# Patient Record
Sex: Female | Born: 1945 | Race: White | Hispanic: No | Smoking: Current every day smoker
Health system: Southern US, Community
[De-identification: ages and names within clinical notes are randomized; demographics above are authoritative.]

## PROBLEM LIST (undated history)

## (undated) DIAGNOSIS — Z860100 Personal history of colon polyps, unspecified: Secondary | ICD-10-CM

## (undated) DIAGNOSIS — I10 Essential (primary) hypertension: Secondary | ICD-10-CM

## (undated) DIAGNOSIS — J449 Chronic obstructive pulmonary disease, unspecified: Secondary | ICD-10-CM

## (undated) DIAGNOSIS — Z860101 Personal history of adenomatous and serrated colon polyps: Secondary | ICD-10-CM

## (undated) DIAGNOSIS — H409 Unspecified glaucoma: Secondary | ICD-10-CM

## (undated) DIAGNOSIS — R7989 Other specified abnormal findings of blood chemistry: Secondary | ICD-10-CM

## (undated) DIAGNOSIS — E119 Type 2 diabetes mellitus without complications: Secondary | ICD-10-CM

## (undated) DIAGNOSIS — E041 Nontoxic single thyroid nodule: Secondary | ICD-10-CM

## (undated) DIAGNOSIS — M1990 Unspecified osteoarthritis, unspecified site: Secondary | ICD-10-CM

## (undated) HISTORY — DX: Personal history of adenomatous and serrated colon polyps: Z86.0101

## (undated) HISTORY — DX: Essential (primary) hypertension: I10

## (undated) HISTORY — PX: UPPER GI ENDOSCOPY: SHX6162

## (undated) HISTORY — DX: Type 2 diabetes mellitus without complications: E11.9

## (undated) HISTORY — DX: Other specified abnormal findings of blood chemistry: R79.89

## (undated) HISTORY — DX: Unspecified osteoarthritis, unspecified site: M19.90

## (undated) HISTORY — PX: FOOT SURGERY: SHX648

## (undated) HISTORY — DX: Unspecified glaucoma: H40.9

## (undated) HISTORY — DX: Personal history of colon polyps, unspecified: Z86.0100

## (undated) HISTORY — DX: Personal history of colonic polyps: Z86.010

---

## 1999-03-06 ENCOUNTER — Other Ambulatory Visit: Admission: RE | Admit: 1999-03-06 | Discharge: 1999-03-06 | Payer: Self-pay | Admitting: Podiatry

## 2002-06-30 HISTORY — PX: SHOULDER SURGERY: SHX246

## 2003-07-01 DIAGNOSIS — I10 Essential (primary) hypertension: Secondary | ICD-10-CM

## 2003-07-01 HISTORY — DX: Essential (primary) hypertension: I10

## 2003-07-24 ENCOUNTER — Encounter: Admission: RE | Admit: 2003-07-24 | Discharge: 2003-10-22 | Payer: Self-pay | Admitting: Anesthesiology

## 2005-06-30 HISTORY — PX: COLONOSCOPY: SHX174

## 2005-08-12 ENCOUNTER — Ambulatory Visit: Payer: Self-pay | Admitting: Family Medicine

## 2006-02-09 ENCOUNTER — Ambulatory Visit: Payer: Self-pay | Admitting: General Surgery

## 2006-06-18 ENCOUNTER — Ambulatory Visit: Payer: Self-pay

## 2006-06-25 ENCOUNTER — Ambulatory Visit: Payer: Self-pay

## 2006-12-15 ENCOUNTER — Encounter
Admission: RE | Admit: 2006-12-15 | Discharge: 2006-12-15 | Payer: Self-pay | Admitting: Physical Medicine and Rehabilitation

## 2007-01-25 ENCOUNTER — Other Ambulatory Visit: Admission: RE | Admit: 2007-01-25 | Discharge: 2007-01-25 | Payer: Self-pay | Admitting: Surgery

## 2007-07-01 HISTORY — PX: CHOLECYSTECTOMY: SHX55

## 2007-07-22 ENCOUNTER — Ambulatory Visit: Payer: Self-pay | Admitting: General Surgery

## 2007-07-26 ENCOUNTER — Ambulatory Visit: Payer: Self-pay | Admitting: General Surgery

## 2007-07-26 ENCOUNTER — Other Ambulatory Visit: Payer: Self-pay

## 2007-07-29 ENCOUNTER — Ambulatory Visit: Payer: Self-pay | Admitting: General Surgery

## 2007-07-31 ENCOUNTER — Inpatient Hospital Stay: Payer: Self-pay | Admitting: General Surgery

## 2007-07-31 ENCOUNTER — Other Ambulatory Visit: Payer: Self-pay

## 2008-06-30 DIAGNOSIS — H409 Unspecified glaucoma: Secondary | ICD-10-CM

## 2008-06-30 HISTORY — DX: Unspecified glaucoma: H40.9

## 2008-12-31 ENCOUNTER — Emergency Department (HOSPITAL_COMMUNITY): Admission: EM | Admit: 2008-12-31 | Discharge: 2008-12-31 | Payer: Self-pay | Admitting: Emergency Medicine

## 2009-01-25 ENCOUNTER — Ambulatory Visit: Payer: Self-pay | Admitting: Cardiovascular Disease

## 2009-06-30 HISTORY — PX: CAROTID STENT INSERTION: SHX5766

## 2009-12-12 ENCOUNTER — Emergency Department: Payer: Self-pay | Admitting: Emergency Medicine

## 2010-07-21 ENCOUNTER — Encounter: Payer: Self-pay | Admitting: Physical Medicine and Rehabilitation

## 2010-07-26 ENCOUNTER — Observation Stay: Payer: Self-pay | Admitting: Otolaryngology

## 2010-10-06 LAB — POCT I-STAT, CHEM 8
BUN: 7 mg/dL (ref 6–23)
Calcium, Ion: 1.01 mmol/L — ABNORMAL LOW (ref 1.12–1.32)
HCT: 37 % (ref 36.0–46.0)
TCO2: 23 mmol/L (ref 0–100)

## 2010-11-06 ENCOUNTER — Ambulatory Visit: Payer: Self-pay | Admitting: Gastroenterology

## 2010-11-11 LAB — PATHOLOGY REPORT

## 2010-11-15 NOTE — Group Therapy Note (Signed)
REASON FOR EVALUATION:  Stephanie Small is a pleasant 65 year old kindly  referred to [place] by primary care.  She is an individual who has suffered from  cervical and lumbar pain  for a number of years.  She actually had to retire  from work secondary to pain, decline in functional indices and quality-of-  life indices.  She relates her pain as an 8/10 on a subjective scale, dull,  aching and throbbing.  There is no temporal relation to the day.  Medications and rest help.  Her family doctor has her on OxyContin, which  she states helps her pain.  She understand this medication.  Her cervical  pain is primarily myofascial.  She has bilateral shoulder pain secondary to  previous rotator cuff repair and treatment of osteoarthritis.  She has  decreased range of motion in the cervical spine secondary to pain with  myofascial overlay.  Her paralumbar position does not reveal any radicular  component, she has no bowel or bladder disorder or overt neurological  deficit.  It is limited by primarily mechanical pain, so described.  She  states no wish to harm self or others.   MEDICATIONS:  Her medications include OxyContin, Tarka and Lexapro.   ALLERGIES:  No known drug allergies.   REVIEW OF SYSTEMS/PAST MEDICAL HISTORY:  The 14-point review of systems,  health and history form are reviewed.  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   PAST SURGICAL HISTORY:  Surgical history include right and left shoulder  arthroscopy and repair.   FAMILY HISTORY:  Family history remarkable for hypertension.   SOCIAL HISTORY:  She is a nonsmoker, nondrinker.  She is married.  She is  currently retired as a Writer.   Review of systems, family and social history otherwise noncontributory to  the pain problem.   PHYSICAL EXAMINATION:  GENERAL:  Physical exam  reveals a pleasant obese  female sitting comfortably in bed.  Gait, affect and appearance are normal,  oriented x3.  HEENT:  Unremarkable.  CHEST:  Chest clear to auscultation and percussion.  CARDIAC:  She has a regular rate and rhythm without rub, murmur or gallop.  ABDOMEN:  Abdomen soft, nontender and benign.  No hepatosplenomegaly.  It is  obese.  NEUROMUSCULAR:  She has diffuse paralumbar, paracervical, supraclavicular  and myofascial discomfort, quadratus lumborum pain, most notably with  extension and provocative antagonism, pain over PSIS.  Gaenslen's and  Patrick's equivocal.  Straight leg lift unimpaired.  EHL is intact.  She has  no other neurological deficits, motor, sensory or reflexic.   IMPRESSION:  1. Cervicalgia and degenerative spinal disease, cervical spine.  2. Degenerative spinal disease, lumbar spine.   PLAN:  1. We will go ahead and proceed with most problematic region, that being the     lumbar region, with lumbar epidural.  She also has a facetal overlay by     descriptive and by physical exam indices.  I reviewed the MRI, which     revealed degenerative components at multiple levels, with a facetal     overlay.  She has been seen by neurosurgery, who states that she has     multilevel disease, and they do not want to proceed with surgery     secondary to her multilevel disease.  I review available notes.     Apparently, she is being considered for a  discogram to identify the     primary pain generator as suspected at L5-S1.  2. Cervical components, primarily myofascial.  We will more than likely     treat conservatively.  Consider Lidoderm.  Consider muscle stimulator.  3. Recommend home-based therapy and physical therapy.  She lives in [place],     [place], which is quite a distance from any facility.  We     discussed home options.   We described the procedure in detail with her.  We will follow up with this  procedure and lumbar epidural as  described in layman's terms.  Medication  provided through primary care.  Extensive consultation.  Discharge  instructions given.     Celene Kras, MD   HH/MedQ  D:  07/25/2003 11:10:00  T:  07/25/2003 12:10:20  Job #:  [id number]   cc:   Sela Hua.D.

## 2010-11-15 NOTE — Assessment & Plan Note (Signed)
HISTORY:  The [facility].  I  evaluated her via the health and history form, with a 14-point review of  systems.  We performed an uneventful transforaminal injection at th last visit, but  she had side effects she relates to the injection, such as incontinence,  emotional lability and she also was awake at night one or two nights.  I am  thinking that this could possibly be steroid-related, but even those  symptoms are a reach for the dose of steroid we have given.  I do not think  that there was any procedural complication, but she wants to go ahead and  hold off, and that is fine.  We will go ahead and continue to manage her in a multi-modality standpoint,  and I recommend she see Dr. Erick Colace for acupuncture, and other  minimally invasive approaches to improve her function and quality of life.  Again, I discussed other life style enhancements, which would be positive  outcome of predictive illness such as weight control.  Dr. Wynn Banker will  help [place] with a rehab plan.   PHYSICAL EXAMINATION:  Objectively diffuse paralumbar myofascial discomfort  and pain over the PSIS notable, pain on extension with no known neurological  findings motor, sensory, or reflexes.   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   PLAN:  Conservative management.  Consider a translaminar approach at a later  date.  Will see her in followup, and exhaust conservative management.  The  goal is to minimize escalation of narcotic-based pain medication, and to  move away from OxyContin if possible, to a p.r.n. medication.  No barriers  to communication.  Discharge instructions given.      Celene Kras, MD   HH/MedQ  D:  09/19/2003 11:10:11  T:  09/19/2003 12:09:41  Job #:  [id number]

## 2010-11-15 NOTE — Procedures (Signed)
NAMEMAJESTI, GAMBRELL                         ACCOUNT NO.:  [id number]   MEDICAL RECORD NO.:  [id number]                   PATIENT TYPE:  REC   LOCATION:  [facility]                                  FACILITY:  [facility]   PHYSICIAN:  Celene Kras, MD                     DATE OF BIRTH:  05-Apr-1946   DATE OF PROCEDURE:  08/15/2003  DATE OF DISCHARGE:                                 OPERATIVE REPORT   1. [facility]     scheduled lumbar epidural.  The risks, benefits, complications, and     options are fully outlined.  I have reviewed this procedure in detail     with her.   1. Do not believe further imaging or diagnostics are warranted.   1. Lifestyle enhancements reviewed, such as weight control for best outcome.   Objectively diffuse paralumbar myofascial discomfort.  Pain over PSIS.  Notable pain with distention with __________ and Patrick's equivocal.  She  has no other overt neurological deficit of motor, sensory, or reflexes.   IMPRESSION:  Degenerative spinal disease of lumbar spine.   PLAN:  Lumbar epidural.  She is consented.   The patient was taken to the fluoroscopy suite and placed in the prone  position.  The back was prepped and draped in the usual fashion.  Using a  Hustead needle, I advanced to the L5-S1 interspace without any evidence of  CSF, heme, or paresthesia.  Tested block uneventfully.  Followed with 40 mg  of Aristocort and flushed needle.   She tolerated the procedure well.  No complications to above procedure.  Discharge instructions given.  Will see her in followup.                                                Celene Kras, MD    HH/MEDQ  D:  08/15/2003 11:48:01  T:  08/15/2003 12:41:34  Job:  [id number]

## 2011-07-25 ENCOUNTER — Ambulatory Visit: Payer: Self-pay | Admitting: General Surgery

## 2011-07-28 LAB — PATHOLOGY REPORT

## 2012-01-26 ENCOUNTER — Ambulatory Visit: Payer: Self-pay | Admitting: Otolaryngology

## 2012-02-23 ENCOUNTER — Other Ambulatory Visit: Payer: Self-pay | Admitting: Otolaryngology

## 2012-02-24 ENCOUNTER — Ambulatory Visit: Payer: Self-pay | Admitting: Otolaryngology

## 2012-05-10 ENCOUNTER — Emergency Department: Payer: Self-pay | Admitting: Emergency Medicine

## 2012-05-30 ENCOUNTER — Emergency Department: Payer: Self-pay | Admitting: Emergency Medicine

## 2012-09-07 ENCOUNTER — Ambulatory Visit: Payer: Self-pay | Admitting: Otolaryngology

## 2012-09-13 ENCOUNTER — Ambulatory Visit: Payer: Self-pay | Admitting: Otolaryngology

## 2012-10-07 DIAGNOSIS — G894 Chronic pain syndrome: Secondary | ICD-10-CM | POA: Diagnosis present

## 2012-10-07 DIAGNOSIS — M5030 Other cervical disc degeneration, unspecified cervical region: Secondary | ICD-10-CM | POA: Insufficient documentation

## 2012-10-07 DIAGNOSIS — M51369 Other intervertebral disc degeneration, lumbar region without mention of lumbar back pain or lower extremity pain: Secondary | ICD-10-CM | POA: Insufficient documentation

## 2012-10-07 DIAGNOSIS — M5136 Other intervertebral disc degeneration, lumbar region: Secondary | ICD-10-CM | POA: Insufficient documentation

## 2012-10-07 DIAGNOSIS — G8929 Other chronic pain: Secondary | ICD-10-CM | POA: Insufficient documentation

## 2012-10-07 DIAGNOSIS — IMO0002 Reserved for concepts with insufficient information to code with codable children: Secondary | ICD-10-CM | POA: Insufficient documentation

## 2012-10-07 DIAGNOSIS — M542 Cervicalgia: Secondary | ICD-10-CM | POA: Insufficient documentation

## 2013-01-13 ENCOUNTER — Encounter: Payer: Self-pay | Admitting: *Deleted

## 2013-09-27 ENCOUNTER — Emergency Department: Payer: Self-pay | Admitting: Emergency Medicine

## 2013-10-19 ENCOUNTER — Ambulatory Visit: Payer: Self-pay | Admitting: Ophthalmology

## 2013-11-23 ENCOUNTER — Ambulatory Visit: Payer: Self-pay | Admitting: Ophthalmology

## 2014-05-01 ENCOUNTER — Encounter: Payer: Self-pay | Admitting: *Deleted

## 2014-05-01 DIAGNOSIS — Z79899 Other long term (current) drug therapy: Secondary | ICD-10-CM | POA: Insufficient documentation

## 2014-07-31 DIAGNOSIS — M5136 Other intervertebral disc degeneration, lumbar region: Secondary | ICD-10-CM | POA: Insufficient documentation

## 2014-07-31 DIAGNOSIS — M5030 Other cervical disc degeneration, unspecified cervical region: Secondary | ICD-10-CM | POA: Insufficient documentation

## 2014-10-21 ENCOUNTER — Ambulatory Visit
Admit: 2014-10-21 | Disposition: A | Discharge status: Home / Self Care | Payer: Self-pay | Attending: Family Medicine | Admitting: Family Medicine

## 2014-10-21 ENCOUNTER — Ambulatory Visit
Admit: 2014-10-21 | Disposition: A | Discharge status: Home / Self Care | Payer: Self-pay | Attending: Physician Assistant | Admitting: Physician Assistant

## 2014-12-14 DIAGNOSIS — M6080 Other myositis, unspecified site: Secondary | ICD-10-CM | POA: Insufficient documentation

## 2014-12-14 DIAGNOSIS — M6089 Other myositis, multiple sites: Secondary | ICD-10-CM | POA: Insufficient documentation

## 2014-12-14 DIAGNOSIS — M7918 Myalgia, other site: Secondary | ICD-10-CM | POA: Insufficient documentation

## 2015-01-18 DIAGNOSIS — K2100 Gastro-esophageal reflux disease with esophagitis, without bleeding: Secondary | ICD-10-CM | POA: Insufficient documentation

## 2015-02-13 ENCOUNTER — Other Ambulatory Visit: Payer: Self-pay | Admitting: Otolaryngology

## 2015-02-13 DIAGNOSIS — E042 Nontoxic multinodular goiter: Secondary | ICD-10-CM

## 2015-02-16 ENCOUNTER — Ambulatory Visit
Admission: RE | Admit: 2015-02-16 | Discharge: 2015-02-16 | Disposition: A | Discharge status: Home / Self Care | Payer: Medicare Other | Source: Ambulatory Visit | Attending: Otolaryngology | Admitting: Otolaryngology

## 2015-02-16 DIAGNOSIS — R591 Generalized enlarged lymph nodes: Secondary | ICD-10-CM | POA: Insufficient documentation

## 2015-02-16 DIAGNOSIS — E042 Nontoxic multinodular goiter: Secondary | ICD-10-CM

## 2015-02-16 DIAGNOSIS — E041 Nontoxic single thyroid nodule: Secondary | ICD-10-CM | POA: Insufficient documentation

## 2015-02-20 ENCOUNTER — Other Ambulatory Visit: Payer: Self-pay | Admitting: Otolaryngology

## 2015-02-20 DIAGNOSIS — R1310 Dysphagia, unspecified: Secondary | ICD-10-CM

## 2015-03-07 ENCOUNTER — Ambulatory Visit: Payer: Medicare Other | Attending: Otolaryngology

## 2015-09-25 ENCOUNTER — Other Ambulatory Visit: Payer: Self-pay | Admitting: Otolaryngology

## 2015-09-25 DIAGNOSIS — E042 Nontoxic multinodular goiter: Secondary | ICD-10-CM

## 2015-10-04 ENCOUNTER — Ambulatory Visit
Admission: RE | Admit: 2015-10-04 | Discharge: 2015-10-04 | Disposition: A | Discharge status: Home / Self Care | Payer: Medicare Other | Source: Ambulatory Visit | Attending: Otolaryngology | Admitting: Otolaryngology

## 2015-10-04 DIAGNOSIS — E042 Nontoxic multinodular goiter: Secondary | ICD-10-CM

## 2015-10-15 ENCOUNTER — Other Ambulatory Visit: Payer: Self-pay | Admitting: Otolaryngology

## 2015-10-15 DIAGNOSIS — E041 Nontoxic single thyroid nodule: Secondary | ICD-10-CM

## 2015-10-22 ENCOUNTER — Ambulatory Visit: Payer: Medicare Other

## 2015-10-23 ENCOUNTER — Inpatient Hospital Stay
Admission: AD | Admit: 2015-10-23 | Discharge: 2015-10-24 | DRG: 190 | Disposition: A | Discharge status: Home / Self Care | Payer: Medicare Other | Source: Ambulatory Visit | Attending: Specialist | Admitting: Specialist

## 2015-10-23 ENCOUNTER — Encounter: Payer: Self-pay | Admitting: *Deleted

## 2015-10-23 ENCOUNTER — Inpatient Hospital Stay: Payer: Medicare Other

## 2015-10-23 DIAGNOSIS — J189 Pneumonia, unspecified organism: Secondary | ICD-10-CM | POA: Diagnosis present

## 2015-10-23 DIAGNOSIS — R251 Tremor, unspecified: Secondary | ICD-10-CM | POA: Diagnosis present

## 2015-10-23 DIAGNOSIS — F418 Other specified anxiety disorders: Secondary | ICD-10-CM | POA: Diagnosis present

## 2015-10-23 DIAGNOSIS — I10 Essential (primary) hypertension: Secondary | ICD-10-CM | POA: Diagnosis present

## 2015-10-23 DIAGNOSIS — Z79899 Other long term (current) drug therapy: Secondary | ICD-10-CM

## 2015-10-23 DIAGNOSIS — Z79891 Long term (current) use of opiate analgesic: Secondary | ICD-10-CM | POA: Diagnosis not present

## 2015-10-23 DIAGNOSIS — G8929 Other chronic pain: Secondary | ICD-10-CM | POA: Diagnosis present

## 2015-10-23 DIAGNOSIS — I2510 Atherosclerotic heart disease of native coronary artery without angina pectoris: Secondary | ICD-10-CM | POA: Diagnosis present

## 2015-10-23 DIAGNOSIS — Z7982 Long term (current) use of aspirin: Secondary | ICD-10-CM | POA: Diagnosis not present

## 2015-10-23 DIAGNOSIS — Z7902 Long term (current) use of antithrombotics/antiplatelets: Secondary | ICD-10-CM | POA: Diagnosis not present

## 2015-10-23 DIAGNOSIS — Z86010 Personal history of colonic polyps: Secondary | ICD-10-CM

## 2015-10-23 DIAGNOSIS — M1990 Unspecified osteoarthritis, unspecified site: Secondary | ICD-10-CM | POA: Diagnosis present

## 2015-10-23 DIAGNOSIS — G43909 Migraine, unspecified, not intractable, without status migrainosus: Secondary | ICD-10-CM | POA: Diagnosis present

## 2015-10-23 DIAGNOSIS — H409 Unspecified glaucoma: Secondary | ICD-10-CM | POA: Diagnosis present

## 2015-10-23 DIAGNOSIS — E119 Type 2 diabetes mellitus without complications: Secondary | ICD-10-CM | POA: Diagnosis present

## 2015-10-23 DIAGNOSIS — Z23 Encounter for immunization: Secondary | ICD-10-CM

## 2015-10-23 DIAGNOSIS — J441 Chronic obstructive pulmonary disease with (acute) exacerbation: Secondary | ICD-10-CM | POA: Diagnosis present

## 2015-10-23 DIAGNOSIS — E041 Nontoxic single thyroid nodule: Secondary | ICD-10-CM | POA: Diagnosis present

## 2015-10-23 DIAGNOSIS — F17210 Nicotine dependence, cigarettes, uncomplicated: Secondary | ICD-10-CM | POA: Diagnosis present

## 2015-10-23 DIAGNOSIS — J440 Chronic obstructive pulmonary disease with acute lower respiratory infection: Principal | ICD-10-CM | POA: Diagnosis present

## 2015-10-23 DIAGNOSIS — J449 Chronic obstructive pulmonary disease, unspecified: Secondary | ICD-10-CM | POA: Diagnosis present

## 2015-10-23 DIAGNOSIS — R059 Cough, unspecified: Secondary | ICD-10-CM

## 2015-10-23 DIAGNOSIS — M542 Cervicalgia: Secondary | ICD-10-CM | POA: Diagnosis present

## 2015-10-23 DIAGNOSIS — K219 Gastro-esophageal reflux disease without esophagitis: Secondary | ICD-10-CM | POA: Diagnosis present

## 2015-10-23 DIAGNOSIS — R0902 Hypoxemia: Secondary | ICD-10-CM | POA: Diagnosis present

## 2015-10-23 DIAGNOSIS — E785 Hyperlipidemia, unspecified: Secondary | ICD-10-CM | POA: Diagnosis present

## 2015-10-23 DIAGNOSIS — R05 Cough: Secondary | ICD-10-CM

## 2015-10-23 HISTORY — DX: Nontoxic single thyroid nodule: E04.1

## 2015-10-23 LAB — BASIC METABOLIC PANEL
ANION GAP: 8 (ref 5–15)
BUN: 11 mg/dL (ref 6–20)
CALCIUM: 9.1 mg/dL (ref 8.9–10.3)
CO2: 24 mmol/L (ref 22–32)
Chloride: 105 mmol/L (ref 101–111)
Creatinine, Ser: 0.76 mg/dL (ref 0.44–1.00)
GLUCOSE: 193 mg/dL — AB (ref 65–99)
POTASSIUM: 3.9 mmol/L (ref 3.5–5.1)
Sodium: 137 mmol/L (ref 135–145)

## 2015-10-23 LAB — CBC
HCT: 40.8 % (ref 35.0–47.0)
Hemoglobin: 13.8 g/dL (ref 12.0–16.0)
MCH: 31.2 pg (ref 26.0–34.0)
MCHC: 33.7 g/dL (ref 32.0–36.0)
MCV: 92.5 fL (ref 80.0–100.0)
PLATELETS: 216 10*3/uL (ref 150–440)
RBC: 4.41 MIL/uL (ref 3.80–5.20)
RDW: 13.2 % (ref 11.5–14.5)
WBC: 5.9 10*3/uL (ref 3.6–11.0)

## 2015-10-23 LAB — INFLUENZA PANEL BY PCR (TYPE A & B)
H1N1FLUPCR: NOT DETECTED
INFLAPCR: NEGATIVE
INFLBPCR: NEGATIVE

## 2015-10-23 LAB — TSH: TSH: 0.373 u[IU]/mL (ref 0.350–4.500)

## 2015-10-23 LAB — GLUCOSE, CAPILLARY
Glucose-Capillary: 147 mg/dL — ABNORMAL HIGH (ref 65–99)
Glucose-Capillary: 299 mg/dL — ABNORMAL HIGH (ref 65–99)

## 2015-10-23 MED ORDER — ACETAMINOPHEN 325 MG PO TABS: 650.0000 mg | ORAL_TABLET | Freq: Four times a day (QID) | ORAL | Status: DC | PRN

## 2015-10-23 MED ORDER — OXYCODONE HCL ER 60 MG PO T12A: EXTENDED_RELEASE_TABLET | Freq: Three times a day (TID) | ORAL | Status: DC

## 2015-10-23 MED ORDER — ENOXAPARIN SODIUM 40 MG/0.4ML ~~LOC~~ SOLN: 40.0000 mg | SUBCUTANEOUS | Status: DC

## 2015-10-23 MED ORDER — LINAGLIPTIN 5 MG PO TABS
5.0000 mg | ORAL_TABLET | Freq: Every day | ORAL | Status: DC
  Administered 2015-10-23 – 2015-10-24 (×2): 5 mg via ORAL
  Filled 2015-10-23 (×2): qty 1

## 2015-10-23 MED ORDER — CITALOPRAM HYDROBROMIDE 20 MG PO TABS
20.0000 mg | ORAL_TABLET | Freq: Every day | ORAL | Status: DC
  Administered 2015-10-23 – 2015-10-24 (×2): 20 mg via ORAL
  Filled 2015-10-23 (×2): qty 1

## 2015-10-23 MED ORDER — METHYLPREDNISOLONE SODIUM SUCC 125 MG IJ SOLR
60.0000 mg | Freq: Two times a day (BID) | INTRAMUSCULAR | Status: DC
  Administered 2015-10-23 – 2015-10-24 (×2): 60 mg via INTRAVENOUS
  Filled 2015-10-23 (×2): qty 2

## 2015-10-23 MED ORDER — INSULIN ASPART 100 UNIT/ML ~~LOC~~ SOLN
0.0000 [IU] | Freq: Three times a day (TID) | SUBCUTANEOUS | Status: DC
  Administered 2015-10-23: 1 [IU] via SUBCUTANEOUS
  Administered 2015-10-24: 2 [IU] via SUBCUTANEOUS
  Filled 2015-10-23: qty 2
  Filled 2015-10-23: qty 1

## 2015-10-23 MED ORDER — PNEUMOCOCCAL VAC POLYVALENT 25 MCG/0.5ML IJ INJ
0.5000 mL | INJECTION | INTRAMUSCULAR | Status: AC
  Administered 2015-10-24: 0.5 mL via INTRAMUSCULAR
  Filled 2015-10-23: qty 0.5

## 2015-10-23 MED ORDER — DICLOFENAC SODIUM 1 % TD GEL: 2.0000 g | Freq: Four times a day (QID) | TRANSDERMAL | Status: DC | PRN

## 2015-10-23 MED ORDER — BUDESONIDE 0.25 MG/2ML IN SUSP
0.2500 mg | Freq: Two times a day (BID) | RESPIRATORY_TRACT | Status: DC
  Administered 2015-10-23 – 2015-10-24 (×3): 0.25 mg via RESPIRATORY_TRACT
  Filled 2015-10-23 (×3): qty 2

## 2015-10-23 MED ORDER — AZITHROMYCIN 250 MG PO TABS
500.0000 mg | ORAL_TABLET | Freq: Every day | ORAL | Status: AC
  Administered 2015-10-23: 14:00:00 500 mg via ORAL
  Filled 2015-10-23: qty 2

## 2015-10-23 MED ORDER — METHOCARBAMOL 500 MG PO TABS
500.0000 mg | ORAL_TABLET | Freq: Three times a day (TID) | ORAL | Status: DC
Start: 2015-10-23 — End: 2015-10-24
  Administered 2015-10-23 – 2015-10-24 (×3): 500 mg via ORAL
  Filled 2015-10-23 (×3): qty 1

## 2015-10-23 MED ORDER — ACETAMINOPHEN 650 MG RE SUPP: 650.0000 mg | Freq: Four times a day (QID) | RECTAL | Status: DC | PRN

## 2015-10-23 MED ORDER — GABAPENTIN 400 MG PO CAPS
400.0000 mg | ORAL_CAPSULE | Freq: Every day | ORAL | Status: DC
  Administered 2015-10-23 – 2015-10-24 (×2): 400 mg via ORAL
  Filled 2015-10-23 (×2): qty 1

## 2015-10-23 MED ORDER — ISOSORBIDE MONONITRATE ER 30 MG PO TB24
30.0000 mg | ORAL_TABLET | Freq: Every day | ORAL | Status: DC
  Administered 2015-10-23 – 2015-10-24 (×2): 30 mg via ORAL
  Filled 2015-10-23 (×2): qty 1

## 2015-10-23 MED ORDER — INSULIN ASPART 100 UNIT/ML ~~LOC~~ SOLN
0.0000 [IU] | Freq: Every day | SUBCUTANEOUS | Status: DC
  Administered 2015-10-23: 22:00:00 3 [IU] via SUBCUTANEOUS
  Filled 2015-10-23: qty 3

## 2015-10-23 MED ORDER — ASPIRIN 81 MG PO CHEW
81.0000 mg | CHEWABLE_TABLET | Freq: Every day | ORAL | Status: DC
  Administered 2015-10-23 – 2015-10-24 (×2): 81 mg via ORAL
  Filled 2015-10-23 (×2): qty 1

## 2015-10-23 MED ORDER — DEXTROSE 5 % IV SOLN
1.0000 g | INTRAVENOUS | Status: DC
  Administered 2015-10-23: 1 g via INTRAVENOUS
  Filled 2015-10-23 (×2): qty 10

## 2015-10-23 MED ORDER — NICOTINE POLACRILEX 2 MG MT GUM: 2.0000 mg | CHEWING_GUM | OROMUCOSAL | Status: DC | PRN

## 2015-10-23 MED ORDER — PRAVASTATIN SODIUM 40 MG PO TABS
40.0000 mg | ORAL_TABLET | Freq: Every day | ORAL | Status: DC
  Administered 2015-10-23 – 2015-10-24 (×2): 40 mg via ORAL
  Filled 2015-10-23 (×2): qty 1

## 2015-10-23 MED ORDER — OXYCODONE HCL ER 20 MG PO T12A
60.0000 mg | EXTENDED_RELEASE_TABLET | Freq: Three times a day (TID) | ORAL | Status: DC
  Administered 2015-10-23 – 2015-10-24 (×2): 60 mg via ORAL
  Filled 2015-10-23 (×2): qty 3

## 2015-10-23 MED ORDER — PANTOPRAZOLE SODIUM 40 MG PO TBEC
40.0000 mg | DELAYED_RELEASE_TABLET | Freq: Every day | ORAL | Status: DC
  Administered 2015-10-23 – 2015-10-24 (×2): 40 mg via ORAL
  Filled 2015-10-23 (×2): qty 1

## 2015-10-23 MED ORDER — AZITHROMYCIN 250 MG PO TABS
250.0000 mg | ORAL_TABLET | Freq: Every day | ORAL | Status: DC
  Administered 2015-10-24: 250 mg via ORAL
  Filled 2015-10-23: qty 1

## 2015-10-23 MED ORDER — IPRATROPIUM-ALBUTEROL 0.5-2.5 (3) MG/3ML IN SOLN
3.0000 mL | Freq: Four times a day (QID) | RESPIRATORY_TRACT | Status: DC
  Administered 2015-10-23 – 2015-10-24 (×5): 3 mL via RESPIRATORY_TRACT
  Filled 2015-10-23 (×5): qty 3

## 2015-10-23 MED ORDER — PNEUMOCOCCAL 13-VAL CONJ VACC IM SUSP: 0.5000 mL | INTRAMUSCULAR | Status: DC

## 2015-10-23 NOTE — H&P (Signed)
[facility] [place] [facility]   PATIENT NAME: Stephanie Small    MR#:  [id number]  DATE OF BIRTH:  [birth year]  DATE OF ADMISSION:  10/23/2015  PRIMARY CARE PHYSICIAN: Lyndon CodeKHAN, FOZIA M, MD   REQUESTING/REFERRING PHYSICIAN: Dr. Desma Maximracy McLean  CHIEF COMPLAINT:  Sent in for low pulse ox  HISTORY OF PRESENT ILLNESS:  Stephanie Small  is a 70 y.o. female presents with shortness of breath that started yesterday. She was coughing and sneezing and developed a headache. She got up this morning and didn't feel any better and family called her physician. In her physician's office the pulse ox was 86% and she was sent in for further evaluation. Patient states that she does not have a productive cough. She is supposed to have a thyroid cyst drainage tomorrow by Dr. Andee PolesVaught. In the hospital, her pulse ox was 90% on room air.  PAST MEDICAL HISTORY:   Past Medical History  Diagnosis Date  . Glaucoma 2010  . Hypertension 2005  . Arthritis   . Diabetes mellitus without complication (HCC)   . Personal history of colonic polyps   . Thyroid cyst     PAST SURGICAL HISTORY:   Past Surgical History  Procedure Laterality Date  . Colonoscopy  2007  . Shoulder surgery Bilateral 2004  . Foot surgery    . Cholecystectomy  2009  . Upper gi endoscopy    . Carotid stent insertion  2011    SOCIAL HISTORY:   Social History  Substance Use Topics  . Smoking status: Current Every Day Smoker  . Smokeless tobacco: Never Used  . Alcohol Use: No    FAMILY HISTORY:   Family History  Problem Relation Age of Onset  . Hypertension Mother    both parents died of influenza.  DRUG ALLERGIES:  No Known Allergies  REVIEW OF SYSTEMS:  CONSTITUTIONAL: No fever. Some weakness. Cold all day yesterday. EYES: No blurred or double vision. Positive for runny nose EARS, NOSE, AND THROAT: No tinnitus or ear pain. No sore throat RESPIRATORY: Positive for cough and shortness of  breath, positive for wheezing. No hemoptysis.  CARDIOVASCULAR: No chest pain, orthopnea, edema.  GASTROINTESTINAL: Positive for dry heaves.  No vomiting, diarrhea or abdominal pain. No blood in bowel movements GENITOURINARY: No dysuria, hematuria.  ENDOCRINE: No polyuria, nocturia, history of goiter HEMATOLOGY: No anemia, easy bruising or bleeding SKIN: No rash or lesion. MUSCULOSKELETAL: Positive joint pains  NEUROLOGIC: No tingling, numbness, weakness.  PSYCHIATRY: No anxiety or depression.   MEDICATIONS AT HOME:   Prior to Admission medications   Medication Sig Start Date End Date Taking? Authorizing Provider  aspirin 81 MG tablet Take 81 mg by mouth daily.   Yes Historical Provider, MD  citalopram (CELEXA) 20 MG tablet Take 20 mg by mouth daily.   Yes Historical Provider, MD  clopidogrel (PLAVIX) 75 MG tablet Take 75 mg by mouth daily.   Yes Historical Provider, MD  diclofenac sodium (VOLTAREN) 1 % GEL Apply topically as needed.   Yes Historical Provider, MD  gabapentin (NEURONTIN) 400 MG capsule Take 400 mg by mouth daily.   Yes Historical Provider, MD  isosorbide mononitrate (IMDUR) 30 MG 24 hr tablet Take 30 mg by mouth daily.   Yes Historical Provider, MD  methocarbamol (ROBAXIN) 500 MG tablet Take 500 mg by mouth 3 (three) times daily.   Yes Historical Provider, MD  nicotine polacrilex (NICORETTE) 2 MG gum Take 2 mg by mouth as needed for smoking cessation.  Yes Historical Provider, MD  oxyCODONE (OXYCONTIN) 60 MG 12 hr tablet Take by mouth 3 (three) times daily.   Yes Historical Provider, MD  pantoprazole (PROTONIX) 40 MG tablet Take 40 mg by mouth daily.   Yes Historical Provider, MD  pravastatin (PRAVACHOL) 40 MG tablet Take 40 mg by mouth daily.   Yes Historical Provider, MD  saxagliptin HCl (ONGLYZA) 2.5 MG TABS tablet Take 2.5 mg by mouth daily.   Yes Historical Provider, MD  SUMAtriptan (IMITREX) 100 MG tablet Take 100 mg by mouth every 2 (two) hours as needed for  migraine. May repeat in 2 hours if headache persists or recurs.   Yes Historical Provider, MD      VITAL SIGNS:  Blood pressure 146/65, pulse 59, temperature 98 F (36.7 C), temperature source Oral, resp. rate 20, SpO2 90 %.  PHYSICAL EXAMINATION:  GENERAL:  70 y.o.-year-old patient lying in the bed with no acute distress.  EYES: Pupils equal, round, reactive to light and accommodation. No scleral icterus. Extraocular muscles intact.  HEENT: Head atraumatic, normocephalic. Oropharynx and nasopharynx clear.  NECK:  Supple, no jugular venous distention. Positive for right thyroid enlargement, no tenderness.  LUNGS: Decreased breath sounds bilaterally, positive for wheezing heard more anteriorly than posteriorly. No rales,rhonchi or crepitation. No use of accessory muscles of respiration.  CARDIOVASCULAR: S1, S2 normal. No murmurs, rubs, or gallops.  ABDOMEN: Soft, nontender, nondistended. Bowel sounds present. No organomegaly or mass.  EXTREMITIES: No pedal edema, cyanosis, or clubbing.  NEUROLOGIC: Cranial nerves II through XII are intact. Muscle strength 5/5 in all extremities. Sensation intact. Gait not checked.  PSYCHIATRIC: The patient is alert and oriented x 3.  SKIN: No rash, lesion, or ulcer.   LABORATORY PANEL:   CBC  Recent Labs Lab 10/23/15 1228  WBC 5.9  HGB 13.8  HCT 40.8  PLT 216   ------------------------------------------------------------------------------------------------------------------  Chemistries  No results for input(s): NA, K, CL, CO2, GLUCOSE, BUN, CREATININE, CALCIUM, MG, AST, ALT, ALKPHOS, BILITOT in the last 168 hours.  Invalid input(s): GFRCGP ------------------------------------------------------------------------------------------------------------------  Cardiac Enzymes No results for input(s): TROPONINI in the last 168  hours. ------------------------------------------------------------------------------------------------------------------  RADIOLOGY:  No results found.  IMPRESSION AND PLAN:   1. COPD exacerbation. Start IV Solu-Medrol, nebulizer treatments with budesonide nebulizers and DuoNeb nebulizers. Start Zithromax. Obtain a chest x-ray. Since pulse ox was 90% on room air here no need for oxygen supplementation. 2. Tobacco abuse smoking cessation counseling done 3 minutes by me and nicotine patch not needed. 3. Right thyroid goiter cyst. Patient was due for drainage tomorrow. We'll see how her respiratory status is prior to allowing this. 4. Type 2 diabetes sugars will likely be high with steroids. Put on sliding scale continue oral medication 5. Chronic neck pain. Continue oxycodone. 6. History of CAD on aspirin. Hold Plavix for procedure 7. Hyperlipidemia continue medication 8. Chronic tremor 9. Gastroesophageal reflux disease without esophagitis on PPI 10. Anxiety depression continue usual medications  All the records are reviewed and case discussed with attending and family. Management plans discussed with the patient, family and they are in agreement.  CODE STATUS: Full code  TOTAL TIME TAKING CARE OF THIS PATIENT: 50 minutes.    Alford Highland M.D on 10/23/2015 at 1:12 PM  Between 7am to 6pm - Pager - [phone]  After 6pm call admission pager [phone]  [facility]  [phone]  CC: Primary care physician; Lyndon Code, MD

## 2015-10-24 ENCOUNTER — Ambulatory Visit: Admission: RE | Admit: 2015-10-24 | Payer: Medicare Other | Source: Ambulatory Visit

## 2015-10-24 DIAGNOSIS — J440 Chronic obstructive pulmonary disease with acute lower respiratory infection: Secondary | ICD-10-CM | POA: Diagnosis not present

## 2015-10-24 LAB — CBC
HEMATOCRIT: 38.5 % (ref 35.0–47.0)
HEMOGLOBIN: 13.4 g/dL (ref 12.0–16.0)
MCH: 31.9 pg (ref 26.0–34.0)
MCHC: 34.7 g/dL (ref 32.0–36.0)
MCV: 92 fL (ref 80.0–100.0)
Platelets: 217 10*3/uL (ref 150–440)
RBC: 4.19 MIL/uL (ref 3.80–5.20)
RDW: 13.4 % (ref 11.5–14.5)
WBC: 5.7 10*3/uL (ref 3.6–11.0)

## 2015-10-24 LAB — BASIC METABOLIC PANEL
ANION GAP: 8 (ref 5–15)
BUN: 17 mg/dL (ref 6–20)
CHLORIDE: 104 mmol/L (ref 101–111)
CO2: 24 mmol/L (ref 22–32)
CREATININE: 0.78 mg/dL (ref 0.44–1.00)
Calcium: 9.2 mg/dL (ref 8.9–10.3)
GFR calc non Af Amer: >60 mL/min (ref >60)
Glucose, Bld: 208 mg/dL — ABNORMAL HIGH (ref 65–99)
POTASSIUM: 4.3 mmol/L (ref 3.5–5.1)
Sodium: 136 mmol/L (ref 135–145)

## 2015-10-24 LAB — GLUCOSE, CAPILLARY
Glucose-Capillary: 198 mg/dL — ABNORMAL HIGH (ref 65–99)
Glucose-Capillary: 269 mg/dL — ABNORMAL HIGH (ref 65–99)

## 2015-10-24 MED ORDER — INSULIN ASPART 100 UNIT/ML ~~LOC~~ SOLN
0.0000 [IU] | Freq: Three times a day (TID) | SUBCUTANEOUS | Status: DC
  Administered 2015-10-24: 13:00:00 8 [IU] via SUBCUTANEOUS
  Filled 2015-10-24: qty 8

## 2015-10-24 MED ORDER — PREDNISONE 10 MG PO TABS: ORAL_TABLET | ORAL | Status: DC

## 2015-10-24 MED ORDER — LEVOFLOXACIN 750 MG PO TABS: 750.0000 mg | ORAL_TABLET | Freq: Every day | ORAL | Status: DC

## 2015-10-24 MED ORDER — INSULIN ASPART 100 UNIT/ML ~~LOC~~ SOLN: 0.0000 [IU] | Freq: Every day | SUBCUTANEOUS | Status: DC

## 2015-10-24 MED ORDER — METHYLPREDNISOLONE SODIUM SUCC 125 MG IJ SOLR: 60.0000 mg | Freq: Every day | INTRAMUSCULAR | Status: DC

## 2015-10-24 MED ORDER — IBUPROFEN 600 MG PO TABS
600.0000 mg | ORAL_TABLET | Freq: Four times a day (QID) | ORAL | Status: DC | PRN
  Administered 2015-10-24: 13:00:00 600 mg via ORAL
  Filled 2015-10-24: qty 1

## 2015-10-24 MED ORDER — INSULIN ASPART 100 UNIT/ML ~~LOC~~ SOLN
3.0000 [IU] | Freq: Three times a day (TID) | SUBCUTANEOUS | Status: DC
  Administered 2015-10-24: 13:00:00 3 [IU] via SUBCUTANEOUS

## 2015-10-24 NOTE — Progress Notes (Signed)
SATURATION QUALIFICATIONS: (This note is used to comply with regulatory documentation for home oxygen)  Patient Saturations on Room Air at Rest = 92%  Patient Saturations on Room Air while Ambulating = 90%   patient does not need home oxygen at this time because saturations on room air are above 88%.

## 2015-10-24 NOTE — Discharge Summary (Signed)
[employer] - [place] at [facility]   PATIENT NAME: Stephanie Small    MR#:  [id number]  DATE OF BIRTH:  Jan 08, 1946  DATE OF ADMISSION:  10/23/2015 ADMITTING PHYSICIAN: Stephanie Highland, MD  DATE OF DISCHARGE: 10/24/2015  PRIMARY CARE PHYSICIAN: Stephanie Code, MD    ADMISSION DIAGNOSIS:  Hypoxia  DISCHARGE DIAGNOSIS:  Active Problems:   COPD exacerbation (HCC)   SECONDARY DIAGNOSIS:   Past Medical History  Diagnosis Date  . Glaucoma 2010  . Hypertension 2005  . Arthritis   . Diabetes mellitus without complication (HCC)   . Personal history of colonic polyps   . Thyroid cyst     HOSPITAL COURSE:   70 year old female with past medical history of glaucoma, hypertension, diabetes, thyroid goiter who presented to the hospital due to shortness of breath and noted to be hypoxic.  1. COPD exacerbation-patient was admitted to the hospital started on IV steroids, DuoNeb's, Pulmicort nebs. -She was also given empiric antibiotics with ceftriaxone and Zithromax for pneumonia. She has clinically improved now being discharged on oral prednisone taper, Levaquin for a few days. -She was ambulated and did not qualify for home oxygen.  2. Pneumonia-while in the hospital patient was treated with IV ceftriaxone, Zithromax but now being discharged on oral Levaquin.  3. History of migraines-she will continue her sumatriptan.  4. Diabetes type 2 without complication-patient's blood sugars were somewhat labile due to the IV steroids but should improve as his steroids are being tapered. She will continue her Onglyza.  5. Essential hypertension-patient will resume her Imdur.  6. Chronic pain-she'll resume her OxyContin.  7. Thyroid cysts/Goitre-patient is awaiting ultrasound-guided biopsy and can reschedule this as an outpatient once discharged.  DISCHARGE CONDITIONS:   Stable  CONSULTS OBTAINED:     DRUG ALLERGIES:  No Known Allergies  DISCHARGE MEDICATIONS:    Current Discharge Medication List    START taking these medications   Details  levofloxacin (LEVAQUIN) 750 MG tablet Take 1 tablet (750 mg total) by mouth daily. Qty: 7 tablet, Refills: 0    predniSONE (DELTASONE) 10 MG tablet Label  & dispense according to the schedule below. 5 Pills PO for 1 day then, 4 Pills PO for 1 day, 3 Pills PO for 1 day, 2 Pills PO for 1 day, 1 Pill PO for 1 days then STOP. Qty: 15 tablet, Refills: 0      CONTINUE these medications which have NOT CHANGED   Details  aspirin 81 MG tablet Take 81 mg by mouth daily.    citalopram (CELEXA) 20 MG tablet Take 20 mg by mouth daily.    clopidogrel (PLAVIX) 75 MG tablet Take 75 mg by mouth daily.    diclofenac sodium (VOLTAREN) 1 % GEL Apply topically as needed.    gabapentin (NEURONTIN) 400 MG capsule Take 400 mg by mouth daily.    isosorbide mononitrate (IMDUR) 30 MG 24 hr tablet Take 30 mg by mouth daily.    methocarbamol (ROBAXIN) 500 MG tablet Take 500 mg by mouth 3 (three) times daily.    nicotine polacrilex (NICORETTE) 2 MG gum Take 2 mg by mouth as needed for smoking cessation.    oxyCODONE (OXYCONTIN) 60 MG 12 hr tablet Take by mouth 3 (three) times daily.    pantoprazole (PROTONIX) 40 MG tablet Take 40 mg by mouth daily.    pravastatin (PRAVACHOL) 40 MG tablet Take 40 mg by mouth daily.    saxagliptin HCl (ONGLYZA) 2.5 MG TABS tablet Take 2.5 mg by  mouth daily.    SUMAtriptan (IMITREX) 100 MG tablet Take 100 mg by mouth every 2 (two) hours as needed for migraine. May repeat in 2 hours if headache persists or recurs.         DISCHARGE INSTRUCTIONS:   DIET:  Cardiac diet and Diabetic diet  DISCHARGE CONDITION:  Stable  ACTIVITY:  Activity as tolerated  OXYGEN:  Home Oxygen: No.   Oxygen Delivery: room air  DISCHARGE LOCATION:  home   If you experience worsening of your admission symptoms, develop shortness of breath, life threatening emergency, suicidal or homicidal thoughts  you must seek medical attention immediately by calling 911 or calling your MD immediately  if symptoms less severe.  You Must read complete instructions/literature along with all the possible adverse reactions/side effects for all the Medicines you take and that have been prescribed to you. Take any new Medicines after you have completely understood and accpet all the possible adverse reactions/side effects.   Please note  You were cared for by a hospitalist during your hospital stay. If you have any questions about your discharge medications or the care you received while you were in the hospital after you are discharged, you can call the unit and asked to speak with the hospitalist on call if the hospitalist that took care of you is not available. Once you are discharged, your primary care physician will handle any further medical issues. Please note that NO REFILLS for any discharge medications will be authorized once you are discharged, as it is imperative that you return to your primary care physician (or establish a relationship with a primary care physician if you do not have one) for your aftercare needs so that they can reassess your need for medications and monitor your lab values.     Today   Shortness of breath, wheezing much improved. Family at bedside. Complaining of a headache due to a migraine.  VITAL SIGNS:  Blood pressure 122/63, pulse 71, temperature 98.1 F (36.7 C), temperature source Oral, resp. rate 20, height  (1.626 m), weight 99.139 kg (218 lb 9 oz), SpO2 91 %.  I/O:   Intake/Output Summary (Last 24 hours) at 10/24/15 1359 Last data filed at 10/24/15 0900  Gross per 24 hour  Intake    480 ml  Output      0 ml  Net    480 ml    PHYSICAL EXAMINATION:  GENERAL:  70 y.o.-year-old patient lying in the bed with no acute distress.  EYES: Pupils equal, round, reactive to light and accommodation. No scleral icterus. Extraocular muscles intact.  HEENT: Head  atraumatic, normocephalic. Oropharynx and nasopharynx clear.  NECK:  Supple, no jugular venous distention. No thyroid enlargement, no tenderness.  LUNGS: Normal breath sounds bilaterally, minimal end expiratory wheezing, no rales,rhonchi. No use of accessory muscles of respiration.  CARDIOVASCULAR: S1, S2 normal. No murmurs, rubs, or gallops.  ABDOMEN: Soft, non-tender, non-distended. Bowel sounds present. No organomegaly or mass.  EXTREMITIES: No pedal edema, cyanosis, or clubbing.  NEUROLOGIC: Cranial nerves II through XII are intact. No focal motor or sensory defecits b/l.  PSYCHIATRIC: The patient is alert and oriented x 3. Good affect.  SKIN: No obvious rash, lesion, or ulcer.   DATA REVIEW:   CBC  Recent Labs Lab 10/24/15 0355  WBC 5.7  HGB 13.4  HCT 38.5  PLT 217    Chemistries   Recent Labs Lab 10/24/15 0355  NA 136  K 4.3  CL 104  CO2  24  GLUCOSE 208*  BUN 17  CREATININE 0.78  CALCIUM 9.2    Cardiac Enzymes No results for input(s): TROPONINI in the last 168 hours.  Microbiology Results  Results for orders placed or performed during the hospital encounter of 10/23/15  CULTURE, BLOOD (ROUTINE X 2) w Reflex to PCR ID Panel     Status: None (Preliminary result)   Collection Time: 10/23/15 12:20 PM  Result Value Ref Range Status   Specimen Description BLOOD LEFT ARM  Final   Special Requests BOTTLES DRAWN AEROBIC AND ANAEROBIC 7CC  Final   Culture NO GROWTH <12 HOURS  Final   Report Status PENDING  Incomplete  CULTURE, BLOOD (ROUTINE X 2) w Reflex to PCR ID Panel     Status: None (Preliminary result)   Collection Time: 10/23/15 12:28 PM  Result Value Ref Range Status   Specimen Description BLOOD RIGHT ARM  Final   Special Requests BOTTLES DRAWN AEROBIC AND ANAEROBIC  8CC  Final   Culture NO GROWTH <12 HOURS  Final   Report Status PENDING  Incomplete    RADIOLOGY:  Dg Chest 2 View  10/23/2015  CLINICAL DATA:  Evaluation of cough post admittance. Patient  has had flu like symptoms. EXAM: CHEST - 2 VIEW COMPARISON:  12/31/2008 FINDINGS: Coarse perihilar interstitial markings. Patchy infiltrate in the anterior left upper lobe. Some linear scarring or subsegmental atelectasis at the left lung base and in the right upper lobe. Heart size remains normal. Atheromatous aorta. No effusion.  No pneumothorax. Anterior vertebral endplate spurring at multiple levels in the lower thoracic spine. Surgical clips right upper abdomen. IMPRESSION: 1. Coarse interstitial markings with patchy anterior left upper lobe infiltrate possibly early pneumonia. Electronically Signed   By: Corlis Leak  Hassell M.D.   On: 10/23/2015 14:09      Management plans discussed with the patient, family and they are in agreement.  Small STATUS:     Small Status Orders        Start     Ordered   10/23/15 1250  Full Small   Continuous     10/23/15 1250    Small Status History    Date Active Date Inactive Small Status Order ID Comments User Context   This patient has a current Small status but no historical Small status.    Advance Directive Documentation        Most Recent Value   Type of Advance Directive  Healthcare Power of Attorney   Pre-existing out of facility DNR order (yellow form or pink MOST form)     "MOST" Form in Place?        TOTAL TIME TAKING CARE OF THIS PATIENT: 40 minutes.    Houston SirenSAINANI,Tarrin Lebow J M.D on 10/24/2015 at 1:59 PM  Between 7am to 6pm - Pager - [phone]  After 6pm go to [email or web address] - password [email or web address] [facility]  [place]  Office  [phone]  CC: Primary care physician; Stephanie CodeKHAN, FOZIA M, MD

## 2015-10-24 NOTE — Progress Notes (Signed)
Pt being discharged today. Discharge instructions given to pt, she verified understanding, prescriptions given to pt.. Pt belongings returned. She was rolled out in wheelchair by staff.

## 2015-10-24 NOTE — Progress Notes (Signed)
Inpatient Diabetes Program Recommendations  AACE/ADA: New Consensus Statement on Inpatient Glycemic Control (2015)  Target Ranges:  Prepandial:   less than 140 mg/dL      Peak postprandial:   less than 180 mg/dL (1-2 hours)      Critically ill patients:  140 - 180 mg/dL  Results for Nadara MustardHORNBUCKLE, Emanuel H (MRN [id number]) as of 10/24/2015 08:25  Ref. Range 10/23/2015 16:56 10/23/2015 21:30 10/24/2015 07:31  Glucose-Capillary Latest Ref Range: 65-99 mg/dL 956147 (H) 213299 (H) 086198 (H)   Review of Glycemic Control  Diabetes history: DM2 Outpatient Diabetes medications: Onglyza 2.5 mg daily Current orders for Inpatient glycemic control: Novolog 0-9 units TID with meals, Novolog 0-5 units QHS, Tradjenta 5 mg daily  Inpatient Diabetes Program Recommendations: Correction (SSI): Please consider increasing Novolog correction to moderate scale (Novolog 0-15 units ) ACHS. Insulin - Meal Coverage: If steroids are continued as ordered (Solumedrol 60 mg Q12H), please consider ordering Novolog 3 units TID with meals for meal coverage.  Thanks, Orlando PennerMarie Casie Sturgeon, RN, MSN, CDE Diabetes Coordinator Inpatient Diabetes Program [phone] (Team Pager from 8am to 5pm) [phone] ([facility] office) [phone] [facility] office) [phone] [facility] office)

## 2015-10-25 DIAGNOSIS — E049 Nontoxic goiter, unspecified: Secondary | ICD-10-CM | POA: Insufficient documentation

## 2015-10-25 DIAGNOSIS — E041 Nontoxic single thyroid nodule: Secondary | ICD-10-CM | POA: Insufficient documentation

## 2015-10-25 DIAGNOSIS — R0603 Acute respiratory distress: Secondary | ICD-10-CM

## 2015-10-25 DIAGNOSIS — E1129 Type 2 diabetes mellitus with other diabetic kidney complication: Secondary | ICD-10-CM | POA: Insufficient documentation

## 2015-10-25 DIAGNOSIS — E119 Type 2 diabetes mellitus without complications: Secondary | ICD-10-CM | POA: Insufficient documentation

## 2015-10-25 DIAGNOSIS — R062 Wheezing: Secondary | ICD-10-CM | POA: Insufficient documentation

## 2015-10-25 DIAGNOSIS — N183 Chronic kidney disease, stage 3 unspecified: Secondary | ICD-10-CM | POA: Insufficient documentation

## 2015-10-25 DIAGNOSIS — I10 Essential (primary) hypertension: Secondary | ICD-10-CM | POA: Insufficient documentation

## 2015-10-25 HISTORY — DX: Acute respiratory distress: R06.03

## 2015-10-25 HISTORY — DX: Wheezing: R06.2

## 2015-10-28 LAB — CULTURE, BLOOD (ROUTINE X 2)
CULTURE: NO GROWTH
Culture: NO GROWTH

## 2015-12-07 ENCOUNTER — Other Ambulatory Visit: Payer: Self-pay | Admitting: Otolaryngology

## 2015-12-07 DIAGNOSIS — R1310 Dysphagia, unspecified: Secondary | ICD-10-CM

## 2015-12-11 ENCOUNTER — Ambulatory Visit
Admission: RE | Admit: 2015-12-11 | Discharge: 2015-12-11 | Disposition: A | Discharge status: Home / Self Care | Payer: Medicare Other | Source: Ambulatory Visit | Attending: Otolaryngology | Admitting: Otolaryngology

## 2015-12-11 DIAGNOSIS — E041 Nontoxic single thyroid nodule: Secondary | ICD-10-CM | POA: Insufficient documentation

## 2015-12-11 DIAGNOSIS — E079 Disorder of thyroid, unspecified: Secondary | ICD-10-CM | POA: Diagnosis not present

## 2015-12-11 LAB — CBC
HEMATOCRIT: 41.6 % (ref 35.0–47.0)
HEMOGLOBIN: 13.8 g/dL (ref 12.0–16.0)
MCH: 31 pg (ref 26.0–34.0)
MCHC: 33.2 g/dL (ref 32.0–36.0)
MCV: 93.4 fL (ref 80.0–100.0)
Platelets: 232 10*3/uL (ref 150–440)
RBC: 4.46 MIL/uL (ref 3.80–5.20)
RDW: 13.1 % (ref 11.5–14.5)
WBC: 5.7 10*3/uL (ref 3.6–11.0)

## 2015-12-11 LAB — APTT: aPTT: 30 seconds (ref 24–36)

## 2015-12-11 LAB — PROTIME-INR
INR: 1.12
Prothrombin Time: 14.6 seconds (ref 11.4–15.0)

## 2015-12-11 NOTE — Procedures (Signed)
Procedure and risks discussed with patient. Informed consent obtained. Will perform US-guided right thyroid biopsy.

## 2015-12-11 NOTE — Procedures (Signed)
Under US guidance, FNA of right thyroid nodule was performed. No immediate complications were noted.

## 2015-12-13 DIAGNOSIS — G250 Essential tremor: Secondary | ICD-10-CM | POA: Insufficient documentation

## 2015-12-19 ENCOUNTER — Ambulatory Visit
Admission: RE | Admit: 2015-12-19 | Discharge: 2015-12-19 | Disposition: A | Discharge status: Home / Self Care | Payer: Medicare Other | Source: Ambulatory Visit | Attending: Otolaryngology | Admitting: Otolaryngology

## 2015-12-19 DIAGNOSIS — R1310 Dysphagia, unspecified: Secondary | ICD-10-CM | POA: Diagnosis present

## 2015-12-19 DIAGNOSIS — R1312 Dysphagia, oropharyngeal phase: Secondary | ICD-10-CM

## 2015-12-19 NOTE — Therapy (Signed)
[facility] [address] [place], [place], [address] Phone: [phone]   Fax:     Modified Barium Swallow  Patient Details  Name: Stephanie Small MRN: [id number] Date of Birth: 07-18-1945 No Data Recorded  Encounter Date: 12/19/2015      End of Session - 12/19/15 1325    Visit Number 1   Number of Visits 1   Date for SLP Re-Evaluation 12/19/15   SLP Start Time 1230   SLP Stop Time  1320   SLP Time Calculation (min) 50 min   Activity Tolerance Patient tolerated treatment well      Past Medical History  Diagnosis Date  . Glaucoma 2010  . Hypertension 2005  . Arthritis   . Diabetes mellitus without complication (HCC)   . Personal history of colonic polyps   . Thyroid cyst     Past Surgical History  Procedure Laterality Date  . Colonoscopy  2007  . Shoulder surgery Bilateral 2004  . Foot surgery    . Cholecystectomy  2009  . Upper gi endoscopy    . Carotid stent insertion  2011    There were no vitals filed for this visit.   Subjective: Patient behavior: (alertness, ability to follow instructions, etc.): Patient is able to provide her swallowing history and follow directions.  Speech is impaired, but intelligible.  Chief complaint: difficulty swallowing secondary thyroid nodules.  Patient states that swallowing is much improved since procedure on the thryoid   Objective:  Radiological Procedure: A videoflouroscopic evaluation of oral-preparatory, reflex initiation, and pharyngeal phases of the swallow was performed; as well as a screening of the upper esophageal phase.  I. POSTURE: Upright in MBS chair  II. VIEW: Lateral  III. COMPENSATORY STRATEGIES: N/A  IV. BOLUSES ADMINISTERED:   Thin Liquid: 2 Small sips, 3 rapid consecutive sips   Nectar-thick Liquid: 1 moderate size bolus    Puree: 2 teaspoon presentations   Mechanical Soft: 1/4 graham cracker in apple sauce  V. RESULTS OF  EVALUATION: A. ORAL PREPARATORY PHASE: (The lips, tongue, and velum are observed for strength and coordination)       **Overall Severity Rating: Within normal limits; ill fitting dentures have Small impact on efficiency of mastication  B. SWALLOW INITIATION/REFLEX: (The reflex is normal if "triggered" by the time the bolus reached the base of the tongue)  **Overall Severity Rating: Mild; triggering at the valleculae for thick consistencies and while falling from the valleculae to the pyriform sinuses with thin liquid  C. PHARYNGEAL PHASE: (Pharyngeal function is normal if the bolus shows rapid, smooth, and continuous transit through the pharynx and there is no pharyngeal residue after the swallow)  **Overall Severity Rating: Within normal limits  D. LARYNGEAL PENETRATION: (Material entering into the laryngeal inlet/vestibule but not aspirated) 2 episodes of transient laryngeal penetration  E. ASPIRATION: None  F. ESOPHAGEAL PHASE: (Screening of the upper esophagus): no abnormality within the viewable cervical esophagus  ASSESSMENT: 70 year old woman, with thyroid nodule and associated difficulty swallowing, is presenting with minimal oropharyngeal dysphagia.  Oral control of the bolus including oral hold, rotary mastication, and anterior to posterior transfer are within normal limits. Timing of the pharyngeal swallow is delayed, triggering at the valleculae for thick consistencies and while falling from the valleculae to the pyriform sinuses with thin liquid.   Pharyngeal stage of swallowing including tongue base retraction, hyolaryngeal excursion, epiglottic inversion, duration/amplitude of UES opening, and laryngeal vestibule closure at the height of  the swallow are within normal limits.  There were 2 episodes of transient laryngeal penetration without laryngeal vestibule residue.  There was no observed pharyngeal residue or aspiration.  The patient reported resolution of subjective swallowing  problems after "they took off fluid from my goiter".  PLAN/RECOMMENDATIONS:   A. Diet: Regular   B. Swallowing Precautions: Standard, reflux precautions   C. Recommended consultation to: follow up with ENT as recommended   D. Therapy recommendations N/A   E. Results and recommendations were discussed with the patient immediately following the study and the final report routed to referring MD.     Dysphagia, oropharyngeal phase      G-Codes - December 29, 2015 1326    Functional Assessment Tool Used MBS, clinical judgment   Functional Limitations Swallowing   Swallow Current Status (Z6109) At least 1 percent but less than 20 percent impaired, limited or restricted   Swallow Goal Status (U0454) At least 1 percent but less than 20 percent impaired, limited or restricted   Swallow Discharge Status [id number]) At least 1 percent but less than 20 percent impaired, limited or restricted          Problem List Patient Active Problem List   Diagnosis Date Noted  . COPD exacerbation (HCC) 10/23/2015   Dollene Primrose, MS/CCC- SLP  Leandrew Koyanagi 12/29/2015, 1:27 PM  [facility] [address] [place], [place], [address] Phone: [phone]   Fax:     Name: Stephanie Small MRN: [id number] Date of Birth: [birth year]

## 2016-01-10 LAB — CYTOLOGY - NON PAP

## 2016-01-14 ENCOUNTER — Encounter: Payer: Self-pay | Admitting: Otolaryngology

## 2016-01-16 ENCOUNTER — Other Ambulatory Visit: Payer: Self-pay | Admitting: Otolaryngology

## 2016-01-16 DIAGNOSIS — E041 Nontoxic single thyroid nodule: Secondary | ICD-10-CM

## 2016-04-07 ENCOUNTER — Ambulatory Visit
Admission: RE | Admit: 2016-04-07 | Discharge: 2016-04-07 | Disposition: A | Discharge status: Home / Self Care | Payer: Medicare Other | Source: Ambulatory Visit | Attending: Otolaryngology | Admitting: Otolaryngology

## 2016-04-07 DIAGNOSIS — E042 Nontoxic multinodular goiter: Secondary | ICD-10-CM | POA: Insufficient documentation

## 2016-04-07 DIAGNOSIS — E041 Nontoxic single thyroid nodule: Secondary | ICD-10-CM | POA: Diagnosis present

## 2016-04-22 ENCOUNTER — Other Ambulatory Visit: Payer: Self-pay | Admitting: Otolaryngology

## 2016-04-22 DIAGNOSIS — E041 Nontoxic single thyroid nodule: Secondary | ICD-10-CM

## 2016-05-21 ENCOUNTER — Encounter: Payer: Self-pay | Admitting: *Deleted

## 2016-07-15 ENCOUNTER — Encounter: Payer: Self-pay | Admitting: *Deleted

## 2016-07-21 ENCOUNTER — Ambulatory Visit: Payer: Self-pay | Admitting: General Surgery

## 2016-07-23 ENCOUNTER — Encounter: Payer: Self-pay | Admitting: General Surgery

## 2016-08-06 ENCOUNTER — Other Ambulatory Visit: Payer: Self-pay

## 2016-08-06 ENCOUNTER — Telehealth: Payer: Self-pay

## 2016-08-06 NOTE — Telephone Encounter (Signed)
Gastroenterology Pre-Procedure Review  Request Date:  Requesting Physician: Dr.   PATIENT REVIEW QUESTIONS: The patient responded to the following health history questions as indicated:    1. Are you having any GI issues? no 2. Do you have a personal history of Polyps? yes (repeat 5 years) 3. Do you have a family history of Colon Cancer or Polyps? no 4. Diabetes Mellitus? no 5. Joint replacements in the past 12 months?no 6. Major health problems in the past 3 months?no 7. Any artificial heart valves, MVP, or defibrillator?yes (Stents)    MEDICATIONS & ALLERGIES:    Patient reports the following regarding taking any anticoagulation/antiplatelet therapy:   Plavix, Coumadin, Eliquis, Xarelto, Lovenox, Pradaxa, Brilinta, or Effient? yes (Plavix 75mg ) Aspirin? no  Patient confirms/reports the following medications:  Current Outpatient Prescriptions  Medication Sig Dispense Refill  . aspirin 81 MG tablet Take 81 mg by mouth daily.    . citalopram (CELEXA) 20 MG tablet Take 20 mg by mouth daily.    . clopidogrel (PLAVIX) 75 MG tablet Take 75 mg by mouth daily.    . diclofenac sodium (VOLTAREN) 1 % GEL Apply topically as needed.    . gabapentin (NEURONTIN) 400 MG capsule Take 400 mg by mouth daily.    . isosorbide mononitrate (IMDUR) 30 MG 24 hr tablet Take 30 mg by mouth daily.    Marland Kitchen. levofloxacin (LEVAQUIN) 750 MG tablet Take 1 tablet (750 mg total) by mouth daily. (Patient not taking: Reported on 12/11/2015) 7 tablet 0  . methocarbamol (ROBAXIN) 500 MG tablet Take 500 mg by mouth 3 (three) times daily.    . nicotine polacrilex (NICORETTE) 2 MG gum Take 2 mg by mouth as needed for smoking cessation. Reported on 12/11/2015    . oxyCODONE (OXYCONTIN) 60 MG 12 hr tablet Take by mouth 3 (three) times daily.    . pantoprazole (PROTONIX) 40 MG tablet Take 40 mg by mouth daily.    . pravastatin (PRAVACHOL) 40 MG tablet Take 40 mg by mouth daily.    . predniSONE (DELTASONE) 10 MG tablet Label  &  dispense according to the schedule below. 5 Pills PO for 1 day then, 4 Pills PO for 1 day, 3 Pills PO for 1 day, 2 Pills PO for 1 day, 1 Pill PO for 1 days then STOP. (Patient not taking: Reported on 12/11/2015) 15 tablet 0  . saxagliptin HCl (ONGLYZA) 2.5 MG TABS tablet Take 2.5 mg by mouth daily.    . SUMAtriptan (IMITREX) 100 MG tablet Take 100 mg by mouth every 2 (two) hours as needed for migraine. May repeat in 2 hours if headache persists or recurs.    . vitamin C (ASCORBIC ACID) 500 MG tablet Take 500 mg by mouth daily.     No current facility-administered medications for this visit.     Patient confirms/reports the following allergies:  No Known Allergies  No orders of the defined types were placed in this encounter.   AUTHORIZATION INFORMATION Primary Insurance: 1D#: Group #:  Secondary Insurance: 1D#: Group #:  SCHEDULE INFORMATION: Date: 08/18/16 Time: Location: [facility]

## 2016-08-15 ENCOUNTER — Other Ambulatory Visit: Payer: Self-pay

## 2016-08-15 MED ORDER — PEG 3350-KCL-NABCB-NACL-NASULF 236 G PO SOLR: ORAL | 0 refills | Status: DC

## 2016-08-18 ENCOUNTER — Ambulatory Visit: Payer: Medicare Other | Admitting: Anesthesiology

## 2016-08-18 ENCOUNTER — Ambulatory Visit
Admission: RE | Admit: 2016-08-18 | Discharge: 2016-08-18 | Disposition: A | Discharge status: Home / Self Care | Payer: Medicare Other | Source: Ambulatory Visit | Attending: Gastroenterology | Admitting: Gastroenterology

## 2016-08-18 ENCOUNTER — Encounter
Admission: RE | Disposition: A | Discharge status: Home / Self Care | Payer: Self-pay | Source: Ambulatory Visit | Attending: Gastroenterology

## 2016-08-18 DIAGNOSIS — F17210 Nicotine dependence, cigarettes, uncomplicated: Secondary | ICD-10-CM | POA: Diagnosis not present

## 2016-08-18 DIAGNOSIS — Z86010 Personal history of colonic polyps: Secondary | ICD-10-CM | POA: Insufficient documentation

## 2016-08-18 DIAGNOSIS — K640 First degree hemorrhoids: Secondary | ICD-10-CM | POA: Insufficient documentation

## 2016-08-18 DIAGNOSIS — H409 Unspecified glaucoma: Secondary | ICD-10-CM | POA: Insufficient documentation

## 2016-08-18 DIAGNOSIS — Z955 Presence of coronary angioplasty implant and graft: Secondary | ICD-10-CM | POA: Insufficient documentation

## 2016-08-18 DIAGNOSIS — Z7982 Long term (current) use of aspirin: Secondary | ICD-10-CM | POA: Diagnosis not present

## 2016-08-18 DIAGNOSIS — Z79891 Long term (current) use of opiate analgesic: Secondary | ICD-10-CM | POA: Diagnosis not present

## 2016-08-18 DIAGNOSIS — M1990 Unspecified osteoarthritis, unspecified site: Secondary | ICD-10-CM | POA: Insufficient documentation

## 2016-08-18 DIAGNOSIS — Z1211 Encounter for screening for malignant neoplasm of colon: Secondary | ICD-10-CM | POA: Diagnosis present

## 2016-08-18 DIAGNOSIS — Z7951 Long term (current) use of inhaled steroids: Secondary | ICD-10-CM | POA: Insufficient documentation

## 2016-08-18 DIAGNOSIS — E119 Type 2 diabetes mellitus without complications: Secondary | ICD-10-CM | POA: Insufficient documentation

## 2016-08-18 DIAGNOSIS — Z7902 Long term (current) use of antithrombotics/antiplatelets: Secondary | ICD-10-CM | POA: Insufficient documentation

## 2016-08-18 DIAGNOSIS — Z860101 Personal history of adenomatous and serrated colon polyps: Secondary | ICD-10-CM

## 2016-08-18 DIAGNOSIS — Z7952 Long term (current) use of systemic steroids: Secondary | ICD-10-CM | POA: Insufficient documentation

## 2016-08-18 DIAGNOSIS — K5730 Diverticulosis of large intestine without perforation or abscess without bleeding: Secondary | ICD-10-CM | POA: Diagnosis not present

## 2016-08-18 DIAGNOSIS — Z79899 Other long term (current) drug therapy: Secondary | ICD-10-CM | POA: Diagnosis not present

## 2016-08-18 DIAGNOSIS — J449 Chronic obstructive pulmonary disease, unspecified: Secondary | ICD-10-CM | POA: Insufficient documentation

## 2016-08-18 DIAGNOSIS — I10 Essential (primary) hypertension: Secondary | ICD-10-CM | POA: Diagnosis not present

## 2016-08-18 HISTORY — DX: Chronic obstructive pulmonary disease, unspecified: J44.9

## 2016-08-18 HISTORY — PX: COLONOSCOPY WITH PROPOFOL: SHX5780

## 2016-08-18 LAB — GLUCOSE, CAPILLARY: GLUCOSE-CAPILLARY: 123 mg/dL — AB (ref 65–99)

## 2016-08-18 SURGERY — COLONOSCOPY WITH PROPOFOL
Anesthesia: Monitor Anesthesia Care | Wound class: Contaminated

## 2016-08-18 MED ORDER — STERILE WATER FOR IRRIGATION IR SOLN
Status: DC | PRN
  Administered 2016-08-18: 09:00:00

## 2016-08-18 MED ORDER — LACTATED RINGERS IV SOLN
INTRAVENOUS | Status: DC
  Administered 2016-08-18 (×2): via INTRAVENOUS

## 2016-08-18 MED ORDER — LIDOCAINE HCL (CARDIAC) 20 MG/ML IV SOLN
INTRAVENOUS | Status: DC | PRN
  Administered 2016-08-18: 20 mg via INTRAVENOUS

## 2016-08-18 MED ORDER — ACETAMINOPHEN 325 MG PO TABS: 325.0000 mg | ORAL_TABLET | ORAL | Status: DC | PRN

## 2016-08-18 MED ORDER — ACETAMINOPHEN 160 MG/5ML PO SOLN
325.0000 mg | ORAL | Status: DC | PRN
Start: 2016-08-18 — End: 2016-08-18

## 2016-08-18 MED ORDER — PROPOFOL 10 MG/ML IV BOLUS
INTRAVENOUS | Status: DC | PRN
  Administered 2016-08-18: 20 mg via INTRAVENOUS
  Administered 2016-08-18 (×3): 60 mg via INTRAVENOUS
  Administered 2016-08-18 (×2): 20 mg via INTRAVENOUS

## 2016-08-18 SURGICAL SUPPLY — 23 items
CANISTER SUCT 1200ML W/VALVE (MISCELLANEOUS) ×2 IMPLANT
CLIP HMST 235XBRD CATH ROT (MISCELLANEOUS) IMPLANT
CLIP RESOLUTION 360 11X235 (MISCELLANEOUS)
FCP ESCP3.2XJMB 240X2.8X (MISCELLANEOUS)
FORCEPS BIOP RAD 4 LRG CAP 4 (CUTTING FORCEPS) IMPLANT
FORCEPS BIOP RJ4 240 W/NDL (MISCELLANEOUS)
FORCEPS ESCP3.2XJMB 240X2.8X (MISCELLANEOUS) IMPLANT
GOWN CVR UNV OPN BCK APRN NK (MISCELLANEOUS) ×2 IMPLANT
GOWN ISOL THUMB LOOP REG UNIV (MISCELLANEOUS) ×4
INJECTOR VARIJECT VIN23 (MISCELLANEOUS) IMPLANT
KIT DEFENDO VALVE AND CONN (KITS) IMPLANT
KIT ENDO PROCEDURE OLY (KITS) ×2 IMPLANT
MARKER SPOT ENDO TATTOO 5ML (MISCELLANEOUS) IMPLANT
PAD GROUND ADULT SPLIT (MISCELLANEOUS) IMPLANT
PROBE APC STR FIRE (PROBE) IMPLANT
RETRIEVER NET ROTH 2.5X230 LF (MISCELLANEOUS) IMPLANT
SNARE SHORT THROW 13M SML OVAL (MISCELLANEOUS) IMPLANT
SNARE SHORT THROW 30M LRG OVAL (MISCELLANEOUS) IMPLANT
SNARE SNG USE RND 15MM (INSTRUMENTS) IMPLANT
SPOT EX ENDOSCOPIC TATTOO (MISCELLANEOUS)
TRAP ETRAP POLY (MISCELLANEOUS) IMPLANT
VARIJECT INJECTOR VIN23 (MISCELLANEOUS)
WATER STERILE IRR 250ML POUR (IV SOLUTION) ×2 IMPLANT

## 2016-08-18 NOTE — Anesthesia Postprocedure Evaluation (Signed)
Anesthesia Post Note  Patient: Stephanie Small  Procedure(s) Performed: Procedure(s) (LRB): COLONOSCOPY WITH PROPOFOL (N/A)  Patient location during evaluation: PACU Anesthesia Type: MAC Level of consciousness: awake and alert and oriented Pain management: satisfactory to patient Vital Signs Assessment: post-procedure vital signs reviewed and stable Respiratory status: spontaneous breathing, nonlabored ventilation and respiratory function stable Cardiovascular status: blood pressure returned to baseline and stable Postop Assessment: Adequate PO intake and No signs of nausea or vomiting Anesthetic complications: no    Raliegh Ip

## 2016-08-18 NOTE — H&P (Signed)
Midge Miniumarren Ereka Brau, MD [facility] [address] [place], [place] [address] Phone: [phone] Fax : [phone]  Primary Care Physician:  Lyndon CodeKHAN, FOZIA M, MD Primary Gastroenterologist:  Dr. Servando SnareWohl  Pre-Procedure History & Physical: HPI:  Stephanie Small is a 71 y.o. female is here for an colonoscopy.   Past Medical History:  Diagnosis Date  . Arthritis   . COPD (chronic obstructive pulmonary disease) (HCC)   . Diabetes mellitus without complication (HCC)   . Glaucoma 2010  . Hypertension 2005  . Personal history of colonic polyps   . Thyroid cyst     Past Surgical History:  Procedure Laterality Date  . CAROTID STENT INSERTION  2011  . CHOLECYSTECTOMY  2009  . COLONOSCOPY  2007  . FOOT SURGERY    . SHOULDER SURGERY Bilateral 2004  . UPPER GI ENDOSCOPY      Prior to Admission medications   Medication Sig Start Date End Date Taking? Authorizing Provider  albuterol (PROVENTIL HFA;VENTOLIN HFA) 108 (90 Base) MCG/ACT inhaler Inhale into the lungs every 6 (six) hours as needed for wheezing or shortness of breath.   Yes Historical Provider, MD  aspirin 81 MG tablet Take 81 mg by mouth daily.   Yes Historical Provider, MD  citalopram (CELEXA) 20 MG tablet Take 20 mg by mouth daily.   Yes Historical Provider, MD  clopidogrel (PLAVIX) 75 MG tablet Take 75 mg by mouth daily.   Yes Historical Provider, MD  diclofenac sodium (VOLTAREN) 1 % GEL Apply topically as needed.   Yes Historical Provider, MD  fluticasone furoate-vilanterol (BREO ELLIPTA) 100-25 MCG/INH AEPB Inhale 1 puff into the lungs daily.   Yes Historical Provider, MD  gabapentin (NEURONTIN) 400 MG capsule Take 400 mg by mouth daily.   Yes Historical Provider, MD  isosorbide mononitrate (IMDUR) 30 MG 24 hr tablet Take 30 mg by mouth daily.   Yes Historical Provider, MD  methocarbamol (ROBAXIN) 500 MG tablet Take 500 mg by mouth 3 (three) times daily.   Yes Historical Provider, MD  nicotine polacrilex (NICORETTE) 2 MG gum Take  2 mg by mouth as needed for smoking cessation. Reported on 12/11/2015   Yes Historical Provider, MD  oxyCODONE (OXYCONTIN) 60 MG 12 hr tablet Take by mouth 3 (three) times daily.   Yes Historical Provider, MD  pantoprazole (PROTONIX) 40 MG tablet Take 40 mg by mouth daily.   Yes Historical Provider, MD  polyethylene glycol (GOLYTELY) 236 g solution Drink one 8 oz glass every 20 mins until stools are clear 08/15/16  Yes Midge Miniumarren Anetha Slagel, MD  pravastatin (PRAVACHOL) 40 MG tablet Take 40 mg by mouth daily.   Yes Historical Provider, MD  saxagliptin HCl (ONGLYZA) 2.5 MG TABS tablet Take 2.5 mg by mouth daily.   Yes Historical Provider, MD  SUMAtriptan (IMITREX) 100 MG tablet Take 100 mg by mouth every 2 (two) hours as needed for migraine. May repeat in 2 hours if headache persists or recurs.   Yes Historical Provider, MD  vitamin C (ASCORBIC ACID) 500 MG tablet Take 500 mg by mouth daily.   Yes Historical Provider, MD  levofloxacin (LEVAQUIN) 750 MG tablet Take 1 tablet (750 mg total) by mouth daily. Patient not taking: Reported on 12/11/2015 10/24/15   Houston SirenVivek J Sainani, MD  predniSONE (DELTASONE) 10 MG tablet Label  & dispense according to the schedule below. 5 Pills PO for 1 day then, 4 Pills PO for 1 day, 3 Pills PO for 1 day, 2 Pills PO for 1 day,  1 Pill PO for 1 days then STOP. Patient not taking: Reported on 12/11/2015 10/24/15   Houston Siren, MD    Allergies as of 08/06/2016  . (No Known Allergies)    Family History  Problem Relation Age of Onset  . Hypertension Mother     Social History   Social History  . Marital status: Married    Spouse name: N/A  . Number of children: N/A  . Years of education: N/A   Occupational History  . Not on file.   Social History Main Topics  . Smoking status: Current Every Day Smoker  . Smokeless tobacco: Never Used  . Alcohol use No  . Drug use: No  . Sexual activity: Not on file   Other Topics Concern  . Not on file   Social History Narrative  .  No narrative on file    Review of Systems: See HPI, otherwise negative ROS  Physical Exam: BP (!) 178/100   Pulse (!) 53   Temp 97.5 F (36.4 C) (Tympanic)   Resp 18   Ht 5\' 4"  (1.626 m)   Wt 226 lb (102.5 kg)   SpO2 95%   BMI 38.79 kg/m  General:   Alert,  pleasant and cooperative in NAD Head:  Normocephalic and atraumatic. Neck:  Supple; no masses or thyromegaly. Lungs:  Clear throughout to auscultation.    Heart:  Regular rate and rhythm. Abdomen:  Soft, nontender and nondistended. Normal bowel sounds, without guarding, and without rebound.   Neurologic:  Alert and  oriented x4;  grossly normal neurologically.  Impression/Plan: Stephanie Small is here for an colonoscopy to be performed for histroy of polyps  Risks, benefits, limitations, and alternatives regarding  colonoscopy have been reviewed with the patient.  Questions have been answered.  All parties agreeable.   Midge Minium, MD  08/18/2016, 8:29 AM

## 2016-08-18 NOTE — Op Note (Signed)
[facility] Gastroenterology Patient Name: Stephanie Small Procedure Date: 08/18/2016 8:26 AM MRN: [id number] Account #: [id number] Date of Birth: 1945/09/25 Admit Type: Outpatient Age: 71 Room: [facility] OR ROOM 01 Gender: Female Note Status: Finalized Procedure:            Colonoscopy Indications:          High risk colon cancer surveillance: Personal history                        of colonic polyps Providers:            Midge Minium MD, MD Referring MD:         Lyndon Code, MD (Referring MD) Medicines:            Propofol per Anesthesia Complications:        No immediate complications. Procedure:            Pre-Anesthesia Assessment:                       - Prior to the procedure, a History and Physical was                        performed, and patient medications and allergies were                        reviewed. The patient's tolerance of previous                        anesthesia was also reviewed. The risks and benefits of                        the procedure and the sedation options and risks were                        discussed with the patient. All questions were                        answered, and informed consent was obtained. Prior                        Anticoagulants: The patient has taken no previous                        anticoagulant or antiplatelet agents. ASA Grade                        Assessment: II - A patient with mild systemic disease.                        After reviewing the risks and benefits, the patient was                        deemed in satisfactory condition to undergo the                        procedure.                       After obtaining informed consent, the colonoscope was  passed under direct vision. Throughout the procedure,                        the patient's blood pressure, pulse, and oxygen                        saturations were monitored continuously. The Olympus                        190  Colonoscope [id number]) was introduced through the                        anus and advanced to the the cecum, identified by                        appendiceal orifice and ileocecal valve. The                        colonoscopy was performed without difficulty. The                        patient tolerated the procedure well. The quality of                        the bowel preparation was good. Findings:      The perianal and digital rectal examinations were normal.      A few small-mouthed diverticula were found in the entire colon.      Non-bleeding internal hemorrhoids were found during retroflexion. The       hemorrhoids were Grade I (internal hemorrhoids that do not prolapse). Impression:           - Diverticulosis in the entire examined colon.                       - Non-bleeding internal hemorrhoids.                       - No specimens collected. Recommendation:       - Discharge patient to home.                       - Resume previous diet.                       - Continue present medications.                       - Repeat colonoscopy in 5 years for surveillance. Procedure Code(s):    --- Professional ---                       [phone], Colonoscopy, flexible; diagnostic, including                        collection of specimen(s) by brushing or washing, when                        performed (separate procedure) Diagnosis Code(s):    --- Professional ---                       Z86.010, Personal history of colonic polyps CPT copyright 2016 American  Medical Association. All rights reserved. The codes documented in this report are preliminary and upon coder review may  be revised to meet current compliance requirements. Midge Miniumarren Stephanie Derstine MD, MD 08/18/2016 8:54:09 AM This report has been signed electronically. Number of Addenda: 0 Note Initiated On: 08/18/2016 8:26 AM Scope Withdrawal Time: 0 hours 6 minutes 24 seconds  Total Procedure Duration: 0 hours 11 minutes 10 seconds       [facility]  [facility]

## 2016-08-18 NOTE — Anesthesia Preprocedure Evaluation (Signed)
Anesthesia Evaluation  Patient identified by MRN, date of birth, ID band Patient awake    Reviewed: Allergy & Precautions, H&P , NPO status , Patient's Chart, lab work & pertinent test results  Airway Mallampati: II  TM Distance: >3 FB Neck ROM: full    Dental  (+) Edentulous Upper, Edentulous Lower   Pulmonary COPD, Current Smoker,    Pulmonary exam normal        Cardiovascular hypertension, + Cardiac Stents  Normal cardiovascular exam     Neuro/Psych    GI/Hepatic   Endo/Other  diabetes  Renal/GU      Musculoskeletal   Abdominal   Peds  Hematology   Anesthesia Other Findings   Reproductive/Obstetrics                             Anesthesia Physical Anesthesia Plan  ASA: III  Anesthesia Plan: MAC   Post-op Pain Management:    Induction:   Airway Management Planned:   Additional Equipment:   Intra-op Plan:   Post-operative Plan:   Informed Consent: I have reviewed the patients History and Physical, chart, labs and discussed the procedure including the risks, benefits and alternatives for the proposed anesthesia with the patient or authorized representative who has indicated his/her understanding and acceptance.     Plan Discussed with:   Anesthesia Plan Comments:         Anesthesia Quick Evaluation

## 2016-08-18 NOTE — Anesthesia Procedure Notes (Signed)
Procedure Name: MAC Performed by: Lind Guest Pre-anesthesia Checklist: Patient identified, Emergency Drugs available, Suction available, Patient being monitored and Timeout performed Patient Re-evaluated:Patient Re-evaluated prior to inductionOxygen Delivery Method: Nasal cannula Preoxygenation: Pre-oxygenation with 100% oxygen

## 2016-08-18 NOTE — Transfer of Care (Signed)
Immediate Anesthesia Transfer of Care Note  Patient: Stephanie Small  Procedure(s) Performed: Procedure(s): COLONOSCOPY WITH PROPOFOL (N/A)  Patient Location: PACU  Anesthesia Type: MAC  Level of Consciousness: awake, alert  and patient cooperative  Airway and Oxygen Therapy: Patient Spontanous Breathing and Patient connected to supplemental oxygen  Post-op Assessment: Post-op Vital signs reviewed, Patient's Cardiovascular Status Stable, Respiratory Function Stable, Patent Airway and No signs of Nausea or vomiting  Post-op Vital Signs: Reviewed and stable  Complications: No apparent anesthesia complications

## 2016-08-19 ENCOUNTER — Encounter: Payer: Self-pay | Admitting: Gastroenterology

## 2016-10-07 ENCOUNTER — Ambulatory Visit: Payer: Medicare Other

## 2016-10-08 ENCOUNTER — Encounter: Payer: Self-pay | Admitting: Obstetrics and Gynecology

## 2016-10-08 ENCOUNTER — Ambulatory Visit (INDEPENDENT_AMBULATORY_CARE_PROVIDER_SITE_OTHER): Payer: Medicare Other | Admitting: Obstetrics and Gynecology

## 2016-10-08 ENCOUNTER — Other Ambulatory Visit: Payer: Self-pay | Admitting: Obstetrics and Gynecology

## 2016-10-08 VITALS — BP 142/94 | Ht 64.0 in | Wt 232.0 lb

## 2016-10-08 DIAGNOSIS — R87610 Atypical squamous cells of undetermined significance on cytologic smear of cervix (ASC-US): Secondary | ICD-10-CM

## 2016-10-08 HISTORY — DX: Atypical squamous cells of undetermined significance on cytologic smear of cervix (ASC-US): R87.610

## 2016-10-08 NOTE — Progress Notes (Signed)
Referring Provider:  Quentin Ore, MD  HPI:  Stephanie Small is a 71 y.o.  G4P4  who presents today for evaluation and management of abnormal cervical cytology.    Dysplasia History:   05/08/2015: Normal pap, HR HPV positive (16/18 negative) 08/26/2016: ASCUS, HPV negative  ROS:   Review of Systems  Constitutional: Negative.   HENT: Negative.   Eyes: Negative.   Respiratory: Negative.   Cardiovascular: Negative.   Gastrointestinal: Negative.   Genitourinary: Negative.        Denies vaginal bleeding, abnormal discharge  Musculoskeletal: Negative.   Skin: Negative.   Neurological: Negative.   Psychiatric/Behavioral: Negative.      OB History  Gravida Para Term Preterm AB Living  SAB TAB Ectopic Multiple Live Births               # Outcome Date GA Lbr Len/2nd Weight Sex Delivery Anes PTL Lv  4 Para           3 Para           2 Para           1 Para             Obstetric Comments  1st Menstrual Cycle: 17  1st Pregnancy:  22    Past Medical History:  Diagnosis Date  . Arthritis   . COPD (chronic obstructive pulmonary disease) (HCC)   . Diabetes mellitus without complication (HCC)   . Glaucoma 2010  . Hypertension 2005  . Personal history of colonic polyps   . Thyroid cyst     Past Surgical History:  Procedure Laterality Date  . CAROTID STENT INSERTION  2011  . CHOLECYSTECTOMY  2009  . COLONOSCOPY  2007  . COLONOSCOPY WITH PROPOFOL N/A 08/18/2016   Procedure: COLONOSCOPY WITH PROPOFOL;  Surgeon: Midge Minium, MD;  [facility];  Service: Endoscopy;  Laterality: N/A;  . FOOT SURGERY    . SHOULDER SURGERY Bilateral 2004  . UPPER GI ENDOSCOPY      SOCIAL HISTORY: History  Alcohol Use No   History  Drug Use No     Family History  Problem Relation Age of Onset  . Hypertension Mother     ALLERGIES:  Shellfish allergy  Current Outpatient Prescriptions on File Prior to Visit  Medication Sig Dispense Refill  .  albuterol (PROVENTIL HFA;VENTOLIN HFA) 108 (90 Base) MCG/ACT inhaler Inhale into the lungs every 6 (six) hours as needed for wheezing or shortness of breath.    Marland Kitchen aspirin 81 MG tablet Take 81 mg by mouth daily.    . citalopram (CELEXA) 20 MG tablet Take 20 mg by mouth daily.    . clopidogrel (PLAVIX) 75 MG tablet Take 75 mg by mouth daily.    . diclofenac sodium (VOLTAREN) 1 % GEL Apply topically as needed.    . fluticasone furoate-vilanterol (BREO ELLIPTA) 100-25 MCG/INH AEPB Inhale 1 puff into the lungs daily.    Marland Kitchen gabapentin (NEURONTIN) 400 MG capsule Take 400 mg by mouth daily.    . isosorbide mononitrate (IMDUR) 30 MG 24 hr tablet Take 30 mg by mouth daily.    Marland Kitchen levofloxacin (LEVAQUIN) 750 MG tablet Take 1 tablet (750 mg total) by mouth daily. (Patient not taking: Reported on 12/11/2015) 7 tablet 0  . methocarbamol (ROBAXIN) 500 MG tablet Take 500 mg by mouth 3 (three) times daily.    . nicotine polacrilex (NICORETTE) 2 MG  gum Take 2 mg by mouth as needed for smoking cessation. Reported on 12/11/2015    . oxyCODONE (OXYCONTIN) 60 MG 12 hr tablet Take by mouth 3 (three) times daily.    . pantoprazole (PROTONIX) 40 MG tablet Take 40 mg by mouth daily.    . polyethylene glycol (GOLYTELY) 236 g solution Drink one 8 oz glass every 20 mins until stools are clear 4000 mL 0  . pravastatin (PRAVACHOL) 40 MG tablet Take 40 mg by mouth daily.    . predniSONE (DELTASONE) 10 MG tablet Label  & dispense according to the schedule below. 5 Pills PO for 1 day then, 4 Pills PO for 1 day, 3 Pills PO for 1 day, 2 Pills PO for 1 day, 1 Pill PO for 1 days then STOP. (Patient not taking: Reported on 12/11/2015) 15 tablet 0  . saxagliptin HCl (ONGLYZA) 2.5 MG TABS tablet Take 2.5 mg by mouth daily.    . SUMAtriptan (IMITREX) 100 MG tablet Take 100 mg by mouth every 2 (two) hours as needed for migraine. May repeat in 2 hours if headache persists or recurs.    . vitamin C (ASCORBIC ACID) 500 MG tablet Take 500 mg by mouth  daily.     No current facility-administered medications on file prior to visit.     Physical Exam: BP (!) 142/94   Ht  (1.626 m)   Wt 232 lb (105.2 kg)   BMI 39.82 kg/m  GEN: WD, WN, NAD.  A+ O x 3, good mood and affect. ABD:  NT, ND.  Soft, no masses.  No hernias noted.   Pelvic:   Vulva: Normal appearance.  No lesions.  Vagina: No lesions or abnormalities noted.  Support: Normal pelvic support.  Urethra No masses tenderness or scarring.  Meatus Normal size without lesions or prolapse.  Cervix: See below.  Anus: Normal exam.  No lesions.  Perineum: Normal exam.  No lesions.        Bimanual   Uterus: Normal size.  Non-tender.  Mobile.  AV.  Adnexae: No masses.  Non-tender to palpation.  Cul-de-sac: Negative for abnormality.   PROCEDURE: 1.  Urine Pregnancy Test:  not done 2.  Colposcopy performed with 4% acetic acid after verbal consent obtained                           - Cervix flush with vagina, especially posteriorly, stenosis noted. Unable to pass cytobrush into endocervical canal.               -Aceto-white Lesions Location(s): mild, diffusely              -Biopsy performed at 12 and 6 o'clock               -ECC indicated and performed: Yes.       -Biopsy sites made hemostatic with pressure, AgNO3, and/or Monsel's solution   -Satisfactory colposcopy: No.    -Evidence of Invasive cervical CA :  NO  ASSESSMENT:  Stephanie Small is a 36 y.o. G4P4 here for No diagnosis found.Marland Kitchen  PLAN: 1.  I discussed the grading system of pap smears and HPV high risk viral types.  We will discuss and base management after colpo results return. 2. Follow up, as needed, based on results      Thomasene Mohair, MD  [facility] Ob[facility]yn, [facility] 10/08/2016  9:37 AM   Quentin Ore, MD [facility]  [address] [place], [place] [address]

## 2016-10-14 LAB — PATHOLOGY

## 2016-10-15 ENCOUNTER — Encounter: Payer: Self-pay | Admitting: Obstetrics and Gynecology

## 2017-01-09 ENCOUNTER — Other Ambulatory Visit: Payer: Self-pay | Admitting: Internal Medicine

## 2017-01-09 DIAGNOSIS — R109 Unspecified abdominal pain: Secondary | ICD-10-CM

## 2017-01-13 ENCOUNTER — Ambulatory Visit
Admission: RE | Admit: 2017-01-13 | Discharge: 2017-01-13 | Disposition: A | Discharge status: Home / Self Care | Payer: Medicare Other | Source: Ambulatory Visit | Attending: Internal Medicine | Admitting: Internal Medicine

## 2017-01-13 DIAGNOSIS — R109 Unspecified abdominal pain: Secondary | ICD-10-CM | POA: Diagnosis present

## 2017-01-13 DIAGNOSIS — N281 Cyst of kidney, acquired: Secondary | ICD-10-CM | POA: Insufficient documentation

## 2017-01-13 DIAGNOSIS — Z9049 Acquired absence of other specified parts of digestive tract: Secondary | ICD-10-CM | POA: Insufficient documentation

## 2017-03-05 DIAGNOSIS — Z0289 Encounter for other administrative examinations: Secondary | ICD-10-CM | POA: Insufficient documentation

## 2017-03-18 ENCOUNTER — Ambulatory Visit (INDEPENDENT_AMBULATORY_CARE_PROVIDER_SITE_OTHER): Payer: Medicare Other | Admitting: Gastroenterology

## 2017-03-18 ENCOUNTER — Other Ambulatory Visit
Admission: RE | Admit: 2017-03-18 | Discharge: 2017-03-18 | Disposition: A | Discharge status: Home / Self Care | Payer: Medicare Other | Source: Ambulatory Visit | Attending: Gastroenterology | Admitting: Gastroenterology

## 2017-03-18 ENCOUNTER — Encounter (INDEPENDENT_AMBULATORY_CARE_PROVIDER_SITE_OTHER): Payer: Self-pay

## 2017-03-18 ENCOUNTER — Other Ambulatory Visit: Payer: Self-pay

## 2017-03-18 ENCOUNTER — Encounter: Payer: Self-pay | Admitting: Gastroenterology

## 2017-03-18 VITALS — BP 125/77 | HR 72 | Temp 98.8°F | Ht 64.0 in | Wt 234.0 lb

## 2017-03-18 DIAGNOSIS — K831 Obstruction of bile duct: Secondary | ICD-10-CM | POA: Diagnosis not present

## 2017-03-18 DIAGNOSIS — K838 Other specified diseases of biliary tract: Secondary | ICD-10-CM | POA: Insufficient documentation

## 2017-03-18 LAB — BASIC METABOLIC PANEL
Anion gap: 8 (ref 5–15)
BUN: 14 mg/dL (ref 6–20)
CALCIUM: 9.3 mg/dL (ref 8.9–10.3)
CO2: 29 mmol/L (ref 22–32)
CREATININE: 0.87 mg/dL (ref 0.44–1.00)
Chloride: 104 mmol/L (ref 101–111)
GFR calc Af Amer: >60 mL/min (ref >60)
GFR calc non Af Amer: >60 mL/min (ref >60)
Glucose, Bld: 105 mg/dL — ABNORMAL HIGH (ref 65–99)
Potassium: 3.7 mmol/L (ref 3.5–5.1)
Sodium: 141 mmol/L (ref 135–145)

## 2017-03-18 LAB — HEPATIC FUNCTION PANEL
ALBUMIN: 3.9 g/dL (ref 3.5–5.0)
ALT: 18 U/L (ref 14–54)
AST: 15 U/L (ref 15–41)
Alkaline Phosphatase: 137 U/L — ABNORMAL HIGH (ref 38–126)
BILIRUBIN TOTAL: 0.7 mg/dL (ref 0.3–1.2)
Total Protein: 7.1 g/dL (ref 6.5–8.1)

## 2017-03-18 NOTE — Progress Notes (Signed)
Arlyss Repress, MD [address]  [address]  [place], [place] [address]  Main: [phone]  Fax: [phone]    Gastroenterology Consultation  Referring Provider:     McLean-Scocuzza, French Ana * Primary Care Physician:  Lyndon Code, MD Primary Gastroenterologist:  Dr. Arlyss Repress Reason for Consultation:     Dilated CBD        HPI:   Stephanie Small is a 71 y.o. y/o female referred by Dr. Lyndon Code, MD  for consultation & management of Abnormal ultrasound. She had a cholecystectomy performed in 2009. US abdomen performed on 01/13/2017 for abdominal pain revealed proximal CBD 1cm, mild intrahepatic biliary duct prominence. She denies any abdominal pain, nausea, vomiting, jaundice, dark urine, pruritus, clay colored stools, fever, chills, abdominal distention, weight loss, loss of appetite. Her most recent LFTs from 11/27/2016 revealed isolated elevated alkaline phosphatase 135. Rest of the liver panel was normal. Prior to this, her normal all in phosphatase was 119 in 09/2015. Smokes vapor. She denies drinking alcohol. She has history of coronary artery disease, has a stent placed more than 10 years ago, currently on Plavix. She reports that her stent is MRI compatible and had an MRI done for her knees after the stent placement. Concerned about bilateral swelling of legs as gradually progressing. She said that DVT has been ruled out. She also sees cardiologist regularly. Her renal function has been normal. She does consume a lot of salt per her daughter.   Accompanied by her daughter today.  GI Procedures:  Colonoscopy 08/18/2016 Few diverticula, internal hemorrhoids, good prep, or polyps found  Colonoscopy 07/25/2011 Diagnosis:  SIGMOID COLON POLYP COLD BIOPSY:  - HYPERPLASTIC POLYP.  EGD 11/06/2010 Diagnosis:  ESOPHAGUS COLD BIOPSY:  FRAGMENTS OF SUPERFICIAL SQUAMOUS MUCOSA SHOWING MILD CHRONIC,  NONSPECIFIC INFLAMMATION. EOSINOPHILS DO NOT APPEAR INCREASED.  NO  INTESTINAL METAPLASIA IS SEEN.  NEGATIVE FOR DYSPLASIA AND MALIGNANCY.   Past Medical History:  Diagnosis Date  . Arthritis   . COPD (chronic obstructive pulmonary disease) (HCC)   . Diabetes mellitus without complication (HCC)   . Glaucoma 2010  . Hypertension 2005  . Personal history of colonic polyps   . Thyroid cyst     Past Surgical History:  Procedure Laterality Date  . CAROTID STENT INSERTION  2011  . CHOLECYSTECTOMY  2009  . COLONOSCOPY  2007  . COLONOSCOPY WITH PROPOFOL N/A 08/18/2016   Procedure: COLONOSCOPY WITH PROPOFOL;  Surgeon: Midge Minium, MD;  Location: [facility];  Service: Endoscopy;  Laterality: N/A;  . FOOT SURGERY    . SHOULDER SURGERY Bilateral 2004  . UPPER GI ENDOSCOPY      Prior to Admission medications   Medication Sig Start Date End Date Taking? Authorizing Provider  albuterol (PROVENTIL HFA;VENTOLIN HFA) 108 (90 Base) MCG/ACT inhaler Inhale into the lungs every 6 (six) hours as needed for wheezing or shortness of breath.   Yes [provider]  aspirin 81 MG tablet Take 81 mg by mouth daily.   Yes [provider]  citalopram (CELEXA) 20 MG tablet Take 20 mg by mouth daily.   Yes [provider]  clopidogrel (PLAVIX) 75 MG tablet Take 75 mg by mouth daily.   Yes [provider]  diclofenac sodium (VOLTAREN) 1 % GEL Apply topically as needed.   Yes [provider]  fluticasone furoate-vilanterol (BREO ELLIPTA) 100-25 MCG/INH AEPB Inhale 1 puff into the lungs daily.   Yes [provider]  gabapentin (NEURONTIN) 400  MG capsule Take 400 mg by mouth daily.   Yes [provider]  isosorbide mononitrate (IMDUR) 30 MG 24 hr tablet Take 30 mg by mouth daily.   Yes [provider]  levofloxacin (LEVAQUIN) 750 MG tablet Take 1 tablet (750 mg total) by mouth daily. 10/24/15  Yes Sainani, Rolly Pancake, MD  methocarbamol (ROBAXIN) 500 MG tablet Take 500 mg by mouth 3 (three) times daily.   Yes  [provider]  nicotine polacrilex (NICORETTE) 2 MG gum Take 2 mg by mouth as needed for smoking cessation. Reported on 12/11/2015   Yes [provider]  oxyCODONE (OXYCONTIN) 60 MG 12 hr tablet Take by mouth 3 (three) times daily.   Yes [provider]  pantoprazole (PROTONIX) 40 MG tablet Take 40 mg by mouth daily.   Yes [provider]  polyethylene glycol (GOLYTELY) 236 g solution Drink one 8 oz glass every 20 mins until stools are clear 08/15/16  Yes Midge Minium, MD  pravastatin (PRAVACHOL) 40 MG tablet Take 40 mg by mouth daily.   Yes [provider]  predniSONE (DELTASONE) 10 MG tablet Label  & dispense according to the schedule below. 5 Pills PO for 1 day then, 4 Pills PO for 1 day, 3 Pills PO for 1 day, 2 Pills PO for 1 day, 1 Pill PO for 1 days then STOP. 10/24/15  Yes Sainani, Rolly Pancake, MD  saxagliptin HCl (ONGLYZA) 2.5 MG TABS tablet Take 2.5 mg by mouth daily.   Yes [provider]  SUMAtriptan (IMITREX) 100 MG tablet Take 100 mg by mouth every 2 (two) hours as needed for migraine. May repeat in 2 hours if headache persists or recurs.   Yes [provider]  vitamin C (ASCORBIC ACID) 500 MG tablet Take 500 mg by mouth daily.   Yes [provider]    Family History  Problem Relation Age of Onset  . Hypertension Mother      Social History  Substance Use Topics  . Smoking status: Current Every Day Smoker    Types: E-cigarettes  . Smokeless tobacco: Never Used  . Alcohol use No    Allergies as of 03/18/2017 - Review Complete 03/18/2017  Allergen Reaction Noted  . Shellfish allergy Rash 08/14/2016    Review of Systems:    All systems reviewed and negative except where noted in HPI.   Physical Exam:  BP 125/77   Pulse 72   Temp 98.8 F (37.1 C)   Ht  (1.626 m)   Wt 234 lb (106.1 kg)   BMI 40.17 kg/m  No LMP recorded. Patient is postmenopausal.  General:   Alert,  Well-developed,  well-nourished, pleasant and cooperative in NAD, morbidly obese Head:  Normocephalic and atraumatic. Eyes:  Sclera clear, no icterus.   Conjunctiva pink. Ears:  Normal auditory acuity. Nose:  No deformity, discharge, or lesions. Mouth:  No deformity or lesions,oropharynx pink & moist. Neck:  Supple; no masses or thyromegaly. Lungs:  Respirations even and unlabored.  Clear throughout to auscultation.   No wheezes, crackles, or rhonchi. No acute distress. Heart:  Regular rate and rhythm; no murmurs, clicks, rubs, or gallops. Abdomen:  Normal bowel sounds.  No bruits.  Soft, non-tender and non-distended without masses, hepatosplenomegaly or hernias noted.  No guarding or rebound tenderness.   Rectal: Nor performed Msk:  Symmetrical without gross deformities. Good, equal movement & strength bilaterally. Pulses:  Normal pulses noted. Extremities:  No clubbing, 2+ bilateral edema, has superficial varicose  veins in both lower extremities.  No cyanosis. Neurologic:  Alert and oriented x3;  grossly normal neurologically. Skin:  Intact without significant lesions or rashes. No jaundice. Lymph Nodes:  No significant cervical adenopathy. Psych:  Alert and cooperative. Normal mood and affect.  Imaging Studies: Ultrasound abdomen 01/13/2017  Gallbladder: Post cholecystectomy Common bile duct: Diameter: Dilated proximal common bile duct measuring up to 10.5 mm. Mid to distal aspect not visualized secondary to bowel gas. Liver: Mild intrahepatic biliary duct prominence. No focal hepatic lesion.  Assessment and Plan:   Stephanie Small is a 62 y.o. y/o female with Metabolic syndrome, coronary artery disease status post PCI, on Plavix, post cholecystectomy in 2009, isolated mildly elevated alkaline phosphatase in May/2018, ultrasound revealing proximally dilated CBD and mild intrahepatic biliary ductal dilation. This changes are most likely secondary to cholecystectomy. However given that she does have  one-time elevation of alkaline phosphatase, I will recheck LFTs, order MRCP. Based on the MRCP findings, she may or may not need ERCP.  Follow up based on the above tests   Arlyss Repress, MD

## 2017-03-25 ENCOUNTER — Ambulatory Visit
Admission: RE | Admit: 2017-03-25 | Discharge: 2017-03-25 | Disposition: A | Discharge status: Home / Self Care | Payer: Medicare Other | Source: Ambulatory Visit | Attending: Gastroenterology | Admitting: Gastroenterology

## 2017-03-25 DIAGNOSIS — Z9049 Acquired absence of other specified parts of digestive tract: Secondary | ICD-10-CM | POA: Diagnosis not present

## 2017-03-25 DIAGNOSIS — K831 Obstruction of bile duct: Secondary | ICD-10-CM | POA: Diagnosis present

## 2017-03-26 ENCOUNTER — Other Ambulatory Visit: Payer: Self-pay | Admitting: Gastroenterology

## 2017-03-26 DIAGNOSIS — R748 Abnormal levels of other serum enzymes: Secondary | ICD-10-CM

## 2017-03-30 ENCOUNTER — Telehealth: Payer: Self-pay | Admitting: Gastroenterology

## 2017-03-30 ENCOUNTER — Telehealth: Payer: Self-pay

## 2017-03-30 NOTE — Telephone Encounter (Signed)
Patient's daughter called and is wanting results on her MRI.

## 2017-03-30 NOTE — Telephone Encounter (Signed)
Patient has been notified her MRI/MRCP came back negative for any bile duct stones. I recommend further testing for her elevated liver enzymes. Labs already ordered. She will go to the hospital this week for labs. Thanks [employer]

## 2017-04-27 ENCOUNTER — Emergency Department: Payer: Medicare Other

## 2017-04-27 ENCOUNTER — Encounter: Payer: Self-pay | Admitting: Emergency Medicine

## 2017-04-27 ENCOUNTER — Emergency Department
Admission: EM | Admit: 2017-04-27 | Discharge: 2017-04-27 | Disposition: A | Discharge status: Home / Self Care | Payer: Medicare Other | Attending: Emergency Medicine | Admitting: Emergency Medicine

## 2017-04-27 DIAGNOSIS — M791 Myalgia, unspecified site: Secondary | ICD-10-CM | POA: Diagnosis not present

## 2017-04-27 DIAGNOSIS — J441 Chronic obstructive pulmonary disease with (acute) exacerbation: Secondary | ICD-10-CM | POA: Diagnosis not present

## 2017-04-27 DIAGNOSIS — E119 Type 2 diabetes mellitus without complications: Secondary | ICD-10-CM | POA: Diagnosis not present

## 2017-04-27 DIAGNOSIS — R51 Headache: Secondary | ICD-10-CM

## 2017-04-27 DIAGNOSIS — R0600 Dyspnea, unspecified: Secondary | ICD-10-CM | POA: Diagnosis not present

## 2017-04-27 DIAGNOSIS — Z79899 Other long term (current) drug therapy: Secondary | ICD-10-CM | POA: Insufficient documentation

## 2017-04-27 DIAGNOSIS — I10 Essential (primary) hypertension: Secondary | ICD-10-CM | POA: Insufficient documentation

## 2017-04-27 DIAGNOSIS — E041 Nontoxic single thyroid nodule: Secondary | ICD-10-CM

## 2017-04-27 DIAGNOSIS — F17200 Nicotine dependence, unspecified, uncomplicated: Secondary | ICD-10-CM | POA: Insufficient documentation

## 2017-04-27 DIAGNOSIS — Z7982 Long term (current) use of aspirin: Secondary | ICD-10-CM | POA: Insufficient documentation

## 2017-04-27 DIAGNOSIS — R519 Headache, unspecified: Secondary | ICD-10-CM

## 2017-04-27 DIAGNOSIS — R911 Solitary pulmonary nodule: Secondary | ICD-10-CM

## 2017-04-27 DIAGNOSIS — R52 Pain, unspecified: Secondary | ICD-10-CM

## 2017-04-27 LAB — BASIC METABOLIC PANEL
Anion gap: 9 (ref 5–15)
BUN: 13 mg/dL (ref 6–20)
CALCIUM: 8.9 mg/dL (ref 8.9–10.3)
CHLORIDE: 109 mmol/L (ref 101–111)
CO2: 21 mmol/L — ABNORMAL LOW (ref 22–32)
Creatinine, Ser: 0.82 mg/dL (ref 0.44–1.00)
Glucose, Bld: 159 mg/dL — ABNORMAL HIGH (ref 65–99)
Potassium: 3.7 mmol/L (ref 3.5–5.1)
SODIUM: 139 mmol/L (ref 135–145)

## 2017-04-27 LAB — CBC
HCT: 37.5 % (ref 35.0–47.0)
Hemoglobin: 12.7 g/dL (ref 12.0–16.0)
MCH: 31.7 pg (ref 26.0–34.0)
MCHC: 33.8 g/dL (ref 32.0–36.0)
MCV: 93.9 fL (ref 80.0–100.0)
PLATELETS: 214 10*3/uL (ref 150–440)
RBC: 3.99 MIL/uL (ref 3.80–5.20)
RDW: 13 % (ref 11.5–14.5)
WBC: 6.5 10*3/uL (ref 3.6–11.0)

## 2017-04-27 LAB — TROPONIN I

## 2017-04-27 MED ORDER — OXYCODONE HCL ER 40 MG PO T12A
60.0000 mg | EXTENDED_RELEASE_TABLET | Freq: Two times a day (BID) | ORAL | Status: DC
  Administered 2017-04-27: 60 mg via ORAL
  Filled 2017-04-27: qty 1

## 2017-04-27 MED ORDER — IOPAMIDOL (ISOVUE-370) INJECTION 76%
75.0000 mL | Freq: Once | INTRAVENOUS | Status: AC | PRN
  Administered 2017-04-27: 75 mL via INTRAVENOUS

## 2017-04-27 MED ORDER — PREDNISONE 10 MG PO TABS: ORAL_TABLET | ORAL | 0 refills | Status: DC

## 2017-04-27 MED ORDER — ALBUTEROL SULFATE (2.5 MG/3ML) 0.083% IN NEBU
5.0000 mg | INHALATION_SOLUTION | Freq: Once | RESPIRATORY_TRACT | Status: AC
  Administered 2017-04-27: 5 mg via RESPIRATORY_TRACT
  Filled 2017-04-27: qty 6

## 2017-04-27 MED ORDER — METHYLPREDNISOLONE SODIUM SUCC 125 MG IJ SOLR
125.0000 mg | Freq: Once | INTRAMUSCULAR | Status: AC
Start: 2017-04-27 — End: 2017-04-27
  Administered 2017-04-27: 125 mg via INTRAVENOUS
  Filled 2017-04-27: qty 2

## 2017-04-27 MED ORDER — IPRATROPIUM-ALBUTEROL 0.5-2.5 (3) MG/3ML IN SOLN
3.0000 mL | Freq: Once | RESPIRATORY_TRACT | Status: AC
  Administered 2017-04-27: 3 mL via RESPIRATORY_TRACT
  Filled 2017-04-27: qty 3

## 2017-04-27 NOTE — ED Triage Notes (Signed)
Patient from home via [facility]. Reports worsening headache since Saturday. States she was recently started on losartan for blood pressure and her blood pressure has not been well controlled for several months. Patient also reports increased shortness of breath since last night. History of COPD. States SOB worse with exertion. Alert and oriented x4.

## 2017-04-27 NOTE — ED Provider Notes (Signed)
[facility] Emergency Department Provider Note ____________________________________________   I have reviewed the triage vital signs and the triage nursing note.  HISTORY  Chief Complaint Headache and Shortness of Breath   Historian Patient  HPI Stephanie Small is a 71 y.o. female with a history of COPD, does not use home O2, as well as hypertension and chronic back pain and chronic arthritis for which she is on OxyContin 60 mg daily, presents today for 2 or 3 days of feeling fatigued all over, increased shortness of breath, body aches all over, and global generalized nonspecific headache.  No vision changes.  No focal weakness or numbness.  No fevers.  No coughing.  States that she has chronic right lower extremity greater than left lower extremity edema.  Reports elevated blood pressures for which losartan was doubled to 100 mg recently.  Pain all over is considered moderate to severe.   Past Medical History:  Diagnosis Date  . Arthritis   . COPD (chronic obstructive pulmonary disease) (HCC)   . Diabetes mellitus without complication (HCC)   . Glaucoma 2010  . Hypertension 2005  . Personal history of colonic polyps   . Thyroid cyst     Patient Active Problem List   Diagnosis Date Noted  . Dilated cbd, acquired 03/18/2017  . Pain medication agreement signed 03/05/2017  . Atypical squamous cells of undetermined significance (ASCUS) on Papanicolaou smear of cervix 10/08/2016  . History of adenomatous polyp of colon   . Benign essential tremor 12/13/2015  . Essential hypertension 10/25/2015  . Goiter 10/25/2015  . Respiratory distress 10/25/2015  . Thyroid cyst 10/25/2015  . Type 2 diabetes mellitus without complication (HCC) 10/25/2015  . Wheezing 10/25/2015  . COPD with hypoxia (HCC) 10/23/2015  . Gastroesophageal reflux disease with esophagitis 01/18/2015  . 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 Myofascial muscle pain 12/14/2014  . DDD (degenerative  disc disease), cervical 07/31/2014  . DDD (degenerative disc disease), lumbar 07/31/2014  . Polypharmacy 05/01/2014  . Neck pain 10/07/2012  . Other chronic pain 10/07/2012  . Degenerative disk disease 10/07/2012    Past Surgical History:  Procedure Laterality Date  . CAROTID STENT INSERTION  2011  . CHOLECYSTECTOMY  2009  . COLONOSCOPY  2007  . COLONOSCOPY WITH PROPOFOL N/A 08/18/2016   Procedure: COLONOSCOPY WITH PROPOFOL;  Surgeon: Midge Minium, MD;  Location: [facility];  Service: Endoscopy;  Laterality: N/A;  . FOOT SURGERY    . SHOULDER SURGERY Bilateral 2004  . UPPER GI ENDOSCOPY      Prior to Admission medications   Medication Sig Start Date End Date Taking? Authorizing Provider  albuterol (PROVENTIL HFA;VENTOLIN HFA) 108 (90 Base) MCG/ACT inhaler Inhale into the lungs every 6 (six) hours as needed for wheezing or shortness of breath.   Yes [provider]  aspirin 81 MG tablet Take 81 mg by mouth daily.   Yes [provider]  citalopram (CELEXA) 20 MG tablet Take 20 mg by mouth daily.   Yes [provider]  clopidogrel (PLAVIX) 75 MG tablet Take 75 mg by mouth daily.   Yes [provider]  fluticasone furoate-vilanterol (BREO ELLIPTA) 100-25 MCG/INH AEPB Inhale 1 puff into the lungs daily.   Yes [provider]  gabapentin (NEURONTIN) 400 MG capsule Take 400 mg by mouth daily.   Yes [provider]  isosorbide mononitrate (IMDUR) 30 MG 24 hr tablet Take 30 mg by mouth daily.   Yes [provider]  losartan (COZAAR) 100 MG  tablet Take 1 tablet by mouth daily. 04/13/17  Yes [provider]  methocarbamol (ROBAXIN) 500 MG tablet Take 500 mg by mouth 3 (three) times daily.   Yes [provider]  pantoprazole (PROTONIX) 40 MG tablet Take 40 mg by mouth daily.   Yes [provider]  polyethylene glycol (GOLYTELY) 236 g solution Drink one 8 oz glass every 20 mins until stools are clear  08/15/16  Yes Midge Minium, MD  pravastatin (PRAVACHOL) 40 MG tablet Take 40 mg by mouth daily.   Yes [provider]  saxagliptin HCl (ONGLYZA) 2.5 MG TABS tablet Take 2.5 mg by mouth daily.   Yes [provider]  vitamin C (ASCORBIC ACID) 500 MG tablet Take 500 mg by mouth daily.   Yes [provider]  diclofenac sodium (VOLTAREN) 1 % GEL Apply topically as needed.    [provider]  levofloxacin (LEVAQUIN) 750 MG tablet Take 1 tablet (750 mg total) by mouth daily. Patient not taking: Reported on 04/27/2017 10/24/15   Houston Siren, MD  oxyCODONE (OXYCONTIN) 60 MG 12 hr tablet Take by mouth 3 (three) times daily.    [provider]  predniSONE (DELTASONE) 10 MG tablet 40 mg daily for 4 days. 04/27/17   Governor Rooks, MD  SUMAtriptan (IMITREX) 100 MG tablet Take 100 mg by mouth every 2 (two) hours as needed for migraine. May repeat in 2 hours if headache persists or recurs.    [provider]    Allergies  Allergen Reactions  . Shellfish Allergy Rash    Family History  Problem Relation Age of Onset  . Hypertension Mother     Social History Social History  Substance Use Topics  . Smoking status: Current Every Day Smoker    Types: E-cigarettes  . Smokeless tobacco: Never Used  . Alcohol use No    Review of Systems  Constitutional: Negative for fever. Eyes: Negative for visual changes. ENT: Negative for sore throat. Cardiovascular: Negative for chest pain. Respiratory: Positive for shortness of breath. Gastrointestinal: Negative for abdominal pain, vomiting and diarrhea. Genitourinary: Negative for dysuria. Musculoskeletal: Positive for chronic back pain. Skin: Negative for rash. Neurological: Positive for generalized headache.  ____________________________________________   PHYSICAL EXAM:  VITAL SIGNS: ED Triage Vitals  Enc Vitals Group     BP 04/27/17 0750 (!) 161/89     Pulse Rate 04/27/17 0750 (!) 51      Resp 04/27/17 0750 18     Temp 04/27/17 0750 98.4 F (36.9 C)     Temp Source 04/27/17 0750 Oral     SpO2 04/27/17 0750 94 %     Weight 04/27/17 0746 225 lb (102.1 kg)     Height 04/27/17 0746 5\' 4"  (1.626 m)     Head Circumference --      Peak Flow --      Pain Score 04/27/17 0745 2     Pain Loc --      Pain Edu? --      Excl. in GC? --      Constitutional: Alert and oriented.  Patient seems miserable , frowning and grimacing at times. HEENT   Head: Normocephalic and atraumatic.      Eyes: Conjunctivae are normal. Pupils equal and round.       Ears:         Nose: No congestion/rhinnorhea.   Mouth/Throat: Mucous membranes are moist.   Neck: No stridor. Cardiovascular/Chest: Normal rate, regular rhythm.  No murmurs, rubs, or  gallops. Respiratory normal rotatory effort.  No tachypnea or retractions, but she does have tight breath sounds.  Mild end expiratory wheezing.  No rhonchi or rales. Gastrointestinal: Soft. No distention, no guarding, no rebound. Nontender.  Obese Genitourinary/rectal:Deferred Musculoskeletal: Nontender with normal range of motion in all extremities. No joint effusions.  No lower extremity tenderness.  No edema. Neurologic:  Normal speech and language. No gross or focal neurologic deficits are appreciated. Skin:  Skin is warm, dry and intact. No rash noted. Psychiatric: Mood and affect are normal. Speech and behavior are normal. Patient exhibits appropriate insight and judgment.   ____________________________________________  LABS (pertinent positives/negatives) I, Governor Rooks, MD the attending physician have reviewed the labs noted below.  Labs Reviewed  BASIC METABOLIC PANEL - Abnormal; Notable for the following:       Result Value   CO2 21 (*)    Glucose, Bld 159 (*)    All other components within normal limits  CBC  TROPONIN I    ____________________________________________    EKG I, Governor Rooks, MD, the attending physician have  personally viewed and interpreted all ECGs. 52 bpm.  Normal sinus rhythm.  Narrow QS renal axis.  Nonspecific T wave with inverted V1 through V3. ____________________________________________  RADIOLOGY All Xrays were viewed by me.  Imaging interpreted by Radiologist, and I, Governor Rooks, MD the attending physician have reviewed the radiologist interpretation noted below.  Chest x-ray two-view:  IMPRESSION: Evidence of a degree of underlying interstitial fibrosis with likely superimposed interstitial edema. Mild atelectasis on the left but no airspace consolidation appreciable. There is pulmonary vascular congestion. There is aortic atherosclerosis.  Aortic Atherosclerosis (ICD10-I70.0).  CT for PE:  IMPRESSION: 1. No evidence of pulmonary embolism. Mild enlargement of the main pulmonary artery, suggestive of pulmonary arterial hypertension. 2. Trace bilateral pleural effusions and mild interstitial pulmonary edema. 3. Mosaic attenuation, suggestive of chronic airways obstruction, likely smoking-related. 4. 7 mm irregular nodule in the right middle lobe. Non-contrast chest CT at 6-12 months is recommended. If the nodule is stable at time of repeat CT, then future CT at 18-24 months (from today's scan) is considered optional for low-risk patients, but is recommended for high-risk patients. This recommendation follows the consensus statement: Guidelines for Management of Incidental Pulmonary Nodules Detected on CT Images: From the Fleischner Society 2017; Radiology 2017; 284:228-243. 5. Diffusely enlarged, nodular thyroid gland with nonspecific enlarged 1.2 cm right prevascular lymph node just inferior to the right thyroid lobe. Recommend further evaluation with thyroid ultrasound. 6.  Aortic atherosclerosis (ICD10-I70.0). 7. Small hiatal hernia.   __________________________________________  PROCEDURES  Procedure(s) performed: None  Critical Care performed:  None  ____________________________________________  No current facility-administered medications on file prior to encounter.    Current Outpatient Prescriptions on File Prior to Encounter  Medication Sig Dispense Refill  . albuterol (PROVENTIL HFA;VENTOLIN HFA) 108 (90 Base) MCG/ACT inhaler Inhale into the lungs every 6 (six) hours as needed for wheezing or shortness of breath.    Marland Kitchen aspirin 81 MG tablet Take 81 mg by mouth daily.    . citalopram (CELEXA) 20 MG tablet Take 20 mg by mouth daily.    . clopidogrel (PLAVIX) 75 MG tablet Take 75 mg by mouth daily.    . fluticasone furoate-vilanterol (BREO ELLIPTA) 100-25 MCG/INH AEPB Inhale 1 puff into the lungs daily.    Marland Kitchen gabapentin (NEURONTIN) 400 MG capsule Take 400 mg by mouth daily.    . isosorbide mononitrate (IMDUR) 30 MG 24 hr tablet Take 30 mg  by mouth daily.    . methocarbamol (ROBAXIN) 500 MG tablet Take 500 mg by mouth 3 (three) times daily.    . pantoprazole (PROTONIX) 40 MG tablet Take 40 mg by mouth daily.    . polyethylene glycol (GOLYTELY) 236 g solution Drink one 8 oz glass every 20 mins until stools are clear 4000 mL 0  . pravastatin (PRAVACHOL) 40 MG tablet Take 40 mg by mouth daily.    . saxagliptin HCl (ONGLYZA) 2.5 MG TABS tablet Take 2.5 mg by mouth daily.    . vitamin C (ASCORBIC ACID) 500 MG tablet Take 500 mg by mouth daily.    . diclofenac sodium (VOLTAREN) 1 % GEL Apply topically as needed.    Marland Kitchen levofloxacin (LEVAQUIN) 750 MG tablet Take 1 tablet (750 mg total) by mouth daily. (Patient not taking: Reported on 04/27/2017) 7 tablet 0  . oxyCODONE (OXYCONTIN) 60 MG 12 hr tablet Take by mouth 3 (three) times daily.    . SUMAtriptan (IMITREX) 100 MG tablet Take 100 mg by mouth every 2 (two) hours as needed for migraine. May repeat in 2 hours if headache persists or recurs.      ____________________________________________  ED COURSE / ASSESSMENT AND PLAN  Pertinent labs & imaging results that were available during my  care of the patient were reviewed by me and considered in my medical decision making (see chart for details).   Ms. Mendosa has multiple complaints, initially was complaining of shortness of breath, but then stated that she had pain from head to toe all over and body fatigue, and that her head was bothering her which is a generalized nonspecific headache without focal neurologic symptoms.  She does have chronic pain going to her back and generalized arthritis for which she takes OxyContin and she did not take her dose this morning.  She initially thought her symptoms might be related to the increase in blood pressure medication, however she is not too high or too low here, blood pressure systolic around 160.  She has tight breath sounds, could be related to COPD although not clear wheezing.  Patient is going to be given DuoNeb as well as Solu-Medrol.  We discussed the diagnosis of pulmonary embolism and chose to proceed with CT scan.  Terms of the headache, it seems generalized and gradual onset without high risk of red flag features.  On reexamination around noon, patient still having her chronic back pain, this is not new or different from chronic issues.  I discussed results of the CT scan with respect to reassuring lungs.  We did discuss the pulmonary nodules and thyroid nodule and I gave her a copy of her results which she can follow-up with primary care doctor for further investigation.  I am going to treat her for COPD exacerbation with prednisone burst.  Another daughter came to the bedside, we had a long discussion about stress and the contribution of her emotional distress, and the interplay between that and her general health.  We discussed stress management techniques, and protecting herself from people who are making her feel down.  DIFFERENTIAL DIAGNOSIS: Differential includes, but is not limited to, viral syndrome, bronchitis including COPD exacerbation, pneumonia, reactive  airway disease including asthma, CHF including exacerbation with or without pulmonary/interstitial edema, pneumothorax, ACS, thoracic trauma, and pulmonary embolism.  CONSULTATIONS:   None   Patient / Family / Caregiver informed of clinical course, medical decision-making process, and agree with plan.   I discussed return precautions, follow-up instructions, and  discharge instructions with patient and/or family.  Discharge Instructions : You are evaluated for fatigue and body aches as well as shortness of breath and are being treated for COPD exacerbation with several days of prednisone.  Please take your albuterol inhaler 2 puffs every 4 hours as needed for wheezing shortness of breath.  Please take it easy.  As we discussed, your CT of the chest did show a couple of incidental findings which need follow-up with your primary doctor, including lung nodule and thyroid nodule.  Return to the emergency department immediately for any new or worsening condition including trouble breathing, shortness of breath, fever, confusion altered mental status, chest pain, or any other symptoms concerning to you.  ___________________________________________   FINAL CLINICAL IMPRESSION(S) / ED DIAGNOSES   Final diagnoses:  Dyspnea, unspecified type  Headache, unspecified headache type  Generalized body aches  COPD exacerbation (HCC)  Thyroid nodule  Lung nodule              Note: This dictation was prepared with Dragon dictation. Any transcriptional errors that result from this process are unintentional    Governor RooksLord, Nasiyah Laverdiere, MD 04/27/17 1240

## 2017-04-27 NOTE — Discharge Instructions (Signed)
You are evaluated for fatigue and body aches as well as shortness of breath and are being treated for COPD exacerbation with several days of prednisone.  Please take your albuterol inhaler 2 puffs every 4 hours as needed for wheezing shortness of breath.  Please take it easy.  As we discussed, your CT of the chest did show a couple of incidental findings which need follow-up with your primary doctor, including lung nodule and thyroid nodule.  Return to the emergency department immediately for any new or worsening condition including trouble breathing, shortness of breath, fever, confusion altered mental status, chest pain, or any other symptoms concerning to you.

## 2017-04-27 NOTE — ED Notes (Signed)
Pt discharged to home.  Family member driving.  Discharge instructions reviewed.  Verbalized understanding.  No questions or concerns at this time.  Teach back verified.  Pt in NAD.  No items left in ED.   

## 2017-06-02 ENCOUNTER — Other Ambulatory Visit: Payer: Self-pay | Admitting: Otolaryngology

## 2017-06-02 DIAGNOSIS — E041 Nontoxic single thyroid nodule: Secondary | ICD-10-CM

## 2017-06-05 ENCOUNTER — Ambulatory Visit
Admission: RE | Admit: 2017-06-05 | Discharge: 2017-06-05 | Disposition: A | Discharge status: Home / Self Care | Payer: Medicare Other | Source: Ambulatory Visit | Attending: Otolaryngology | Admitting: Otolaryngology

## 2017-06-05 DIAGNOSIS — E042 Nontoxic multinodular goiter: Secondary | ICD-10-CM | POA: Insufficient documentation

## 2017-06-05 DIAGNOSIS — E041 Nontoxic single thyroid nodule: Secondary | ICD-10-CM

## 2017-12-09 ENCOUNTER — Other Ambulatory Visit: Payer: Self-pay | Admitting: Internal Medicine

## 2017-12-09 DIAGNOSIS — Z1231 Encounter for screening mammogram for malignant neoplasm of breast: Secondary | ICD-10-CM

## 2017-12-09 DIAGNOSIS — R922 Inconclusive mammogram: Secondary | ICD-10-CM

## 2018-12-06 IMAGING — CT CT ANGIO CHEST
2 of 6 series · 18 of 46 positions shown · IV contrast (APPLIED)
Comparison: Chest x-ray from same day.

CLINICAL DATA: Worsening shortness of breath.  History of COPD.

EXAM:
CT ANGIOGRAPHY CHEST WITH CONTRAST
TECHNIQUE: Multidetector CT imaging of the chest was performed using the
standard protocol during bolus administration of intravenous
contrast. Multiplanar CT image reconstructions and MIPs were
obtained to evaluate the vascular anatomy.
CONTRAST:  75 cc Isovue 370 intravenous contrast.

[Series 5: thins · axial · 0.75mm/px · z∈[-106,+149]mm · 16 of 281 slices shown]
[im 13/281  lung]
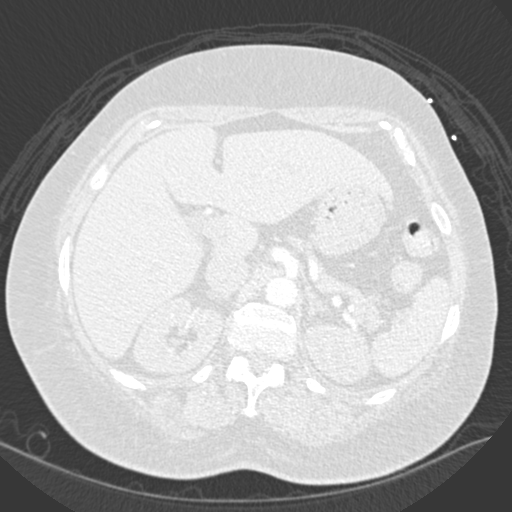
[im 37/281  soft-tissue]
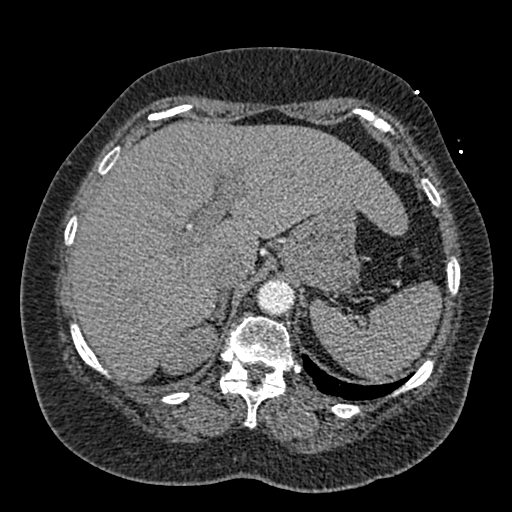
[im 49/281  lung]
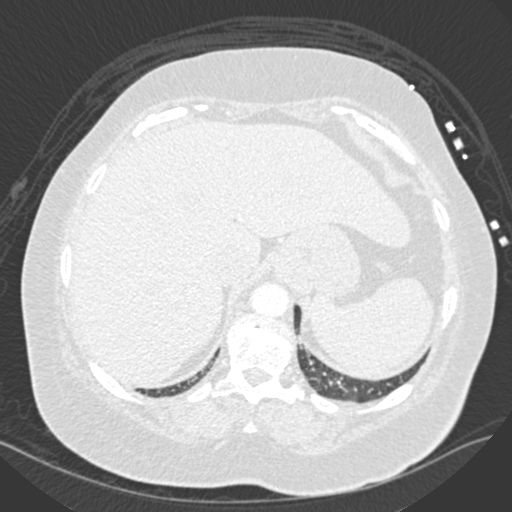
[im 61/281  soft-tissue]
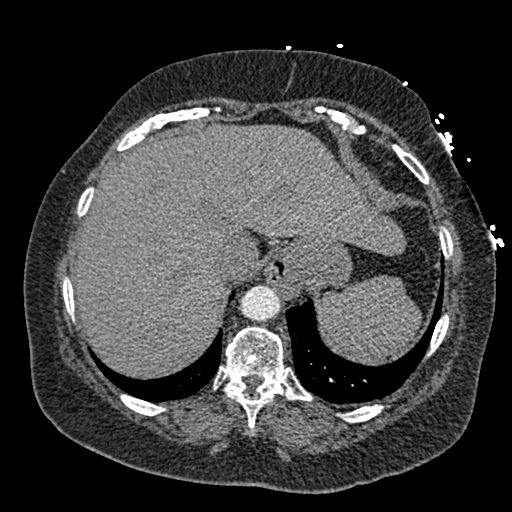
[im 86/281  lung]
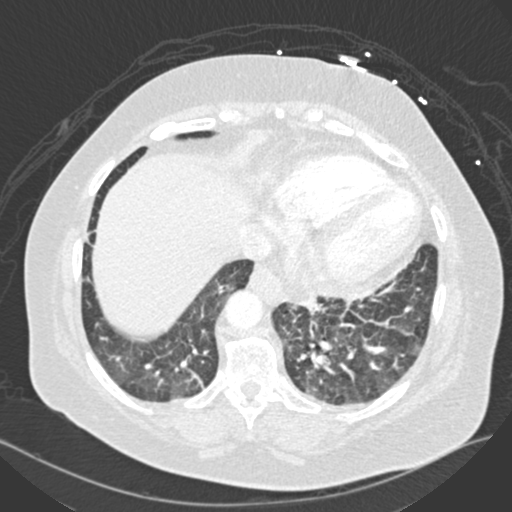
[im 98/281  soft-tissue]
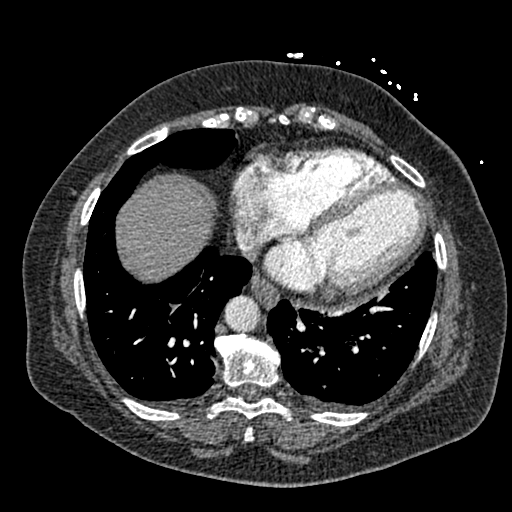
[im 110/281  lung]
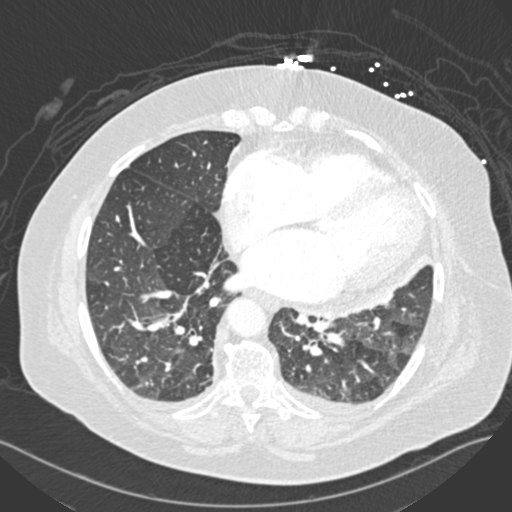
[im 134/281  soft-tissue]
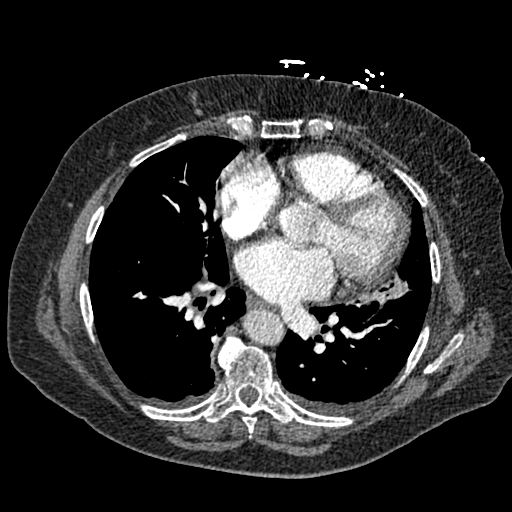
[im 147/281  lung]
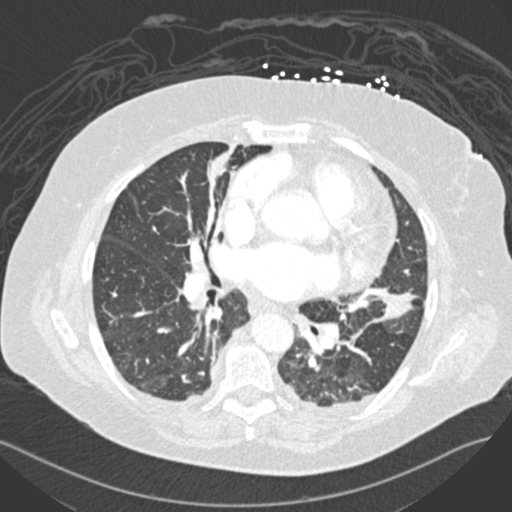
[im 171/281  soft-tissue]
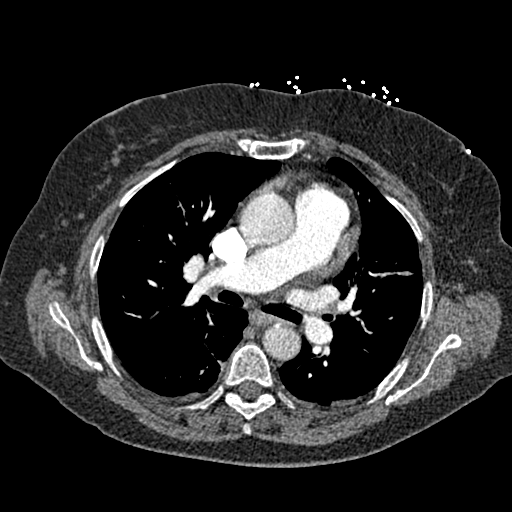
[im 183/281  lung]
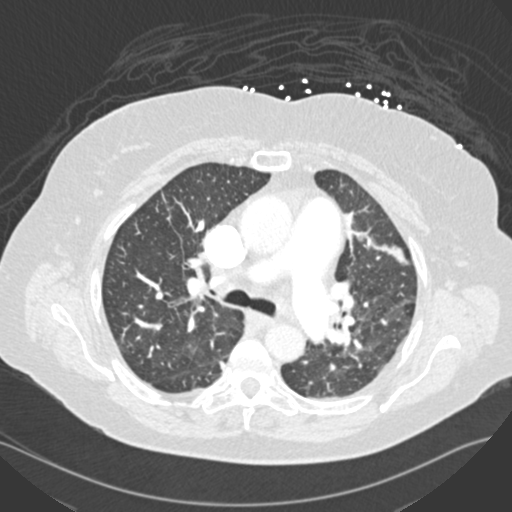
[im 195/281  soft-tissue]
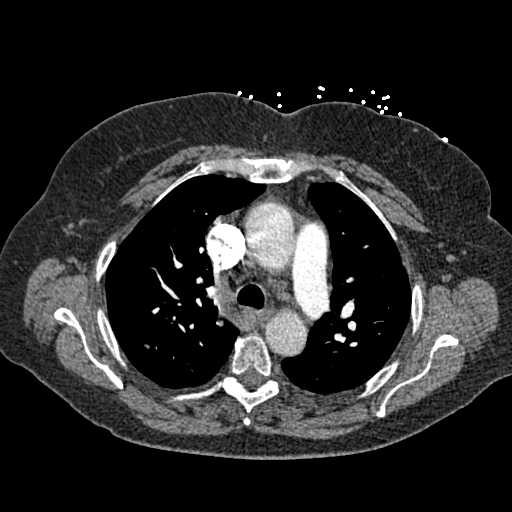
[im 220/281  lung]
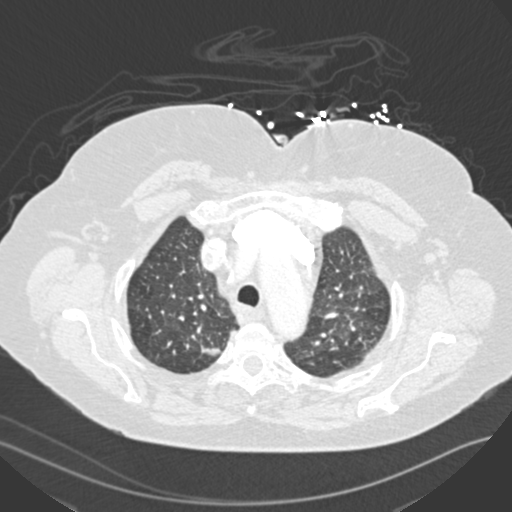
[im 232/281  soft-tissue]
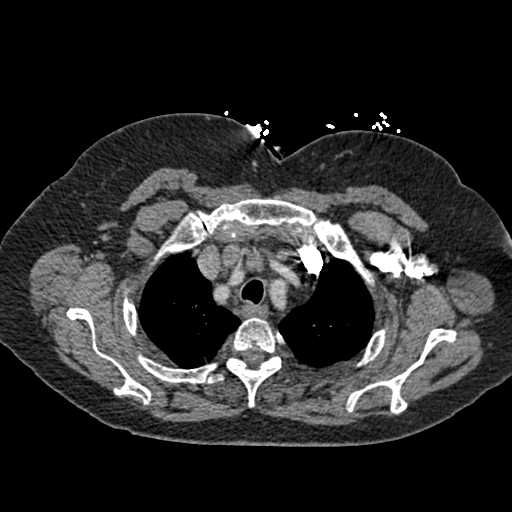
[im 244/281  lung]
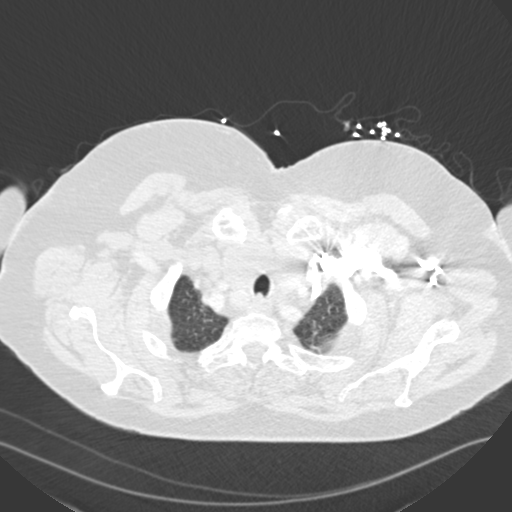
[im 268/281  soft-tissue]
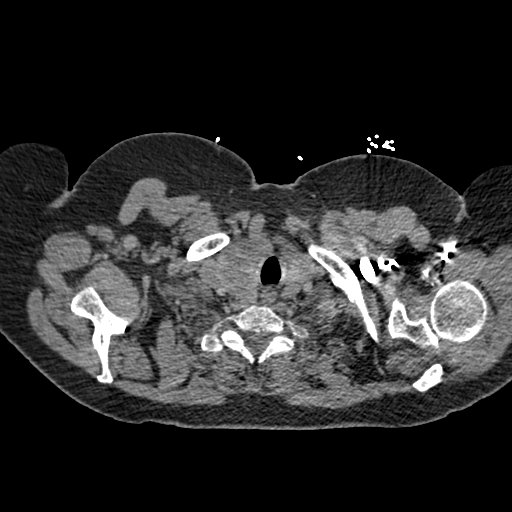

[Series 7: coronal mpr · coronal · 0.56mm/px · 2 of 93 slices shown]
[im 31/93  soft-tissue]
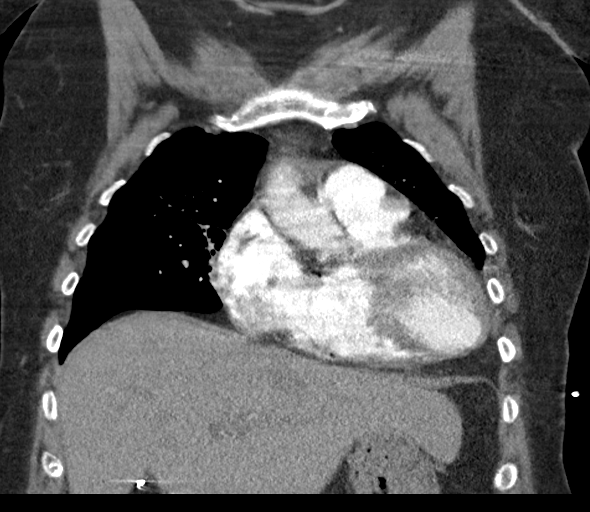
[im 62/93  soft-tissue]
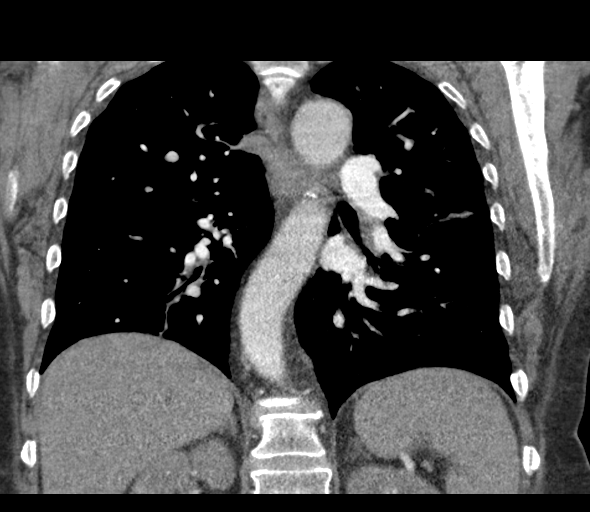

[18 of 46 positions shown; findings below may reference images not displayed]

FINDINGS: Cardiovascular: Satisfactory opacification of the pulmonary arteries
to the segmental level. No evidence of pulmonary embolism. The main
pulmonary artery is enlarged, measuring up to 3.6 cm. Left atrial
enlargement. No pericardial effusion. Normal caliber thoracic aorta.
Coronary, aortic arch, and branch vessel atherosclerotic vascular
disease.

Mediastinum/Nodes: There is an enlarged 1.2 cm right prevascular
lymph node just inferior to the right lobe of the thyroid. No
additional mediastinal, axillary, or hilar lymphadenopathy.
Diffusely enlarged, nodular thyroid gland. The trachea and esophagus
are unremarkable.

Lungs/Pleura: There is a 7 mm irregular nodule in the right middle
lobe (series 6, image 54). Trace bilateral pleural effusions. Mild
basilar predominant interlobular septal thickening. Mosaic
attenuation. Scattered areas of scarring and subsegmental
atelectasis. No consolidation or pneumothorax.

Upper Abdomen: No acute abnormality. Small hiatal hernia. Prior
cholecystectomy.

Musculoskeletal: No chest wall abnormality. No acute or significant
osseous findings.

Review of the MIP images confirms the above findings.
IMPRESSION: 1. No evidence of pulmonary embolism. Mild enlargement of the main
pulmonary artery, suggestive of pulmonary arterial hypertension.
2. Trace bilateral pleural effusions and mild interstitial pulmonary
edema.
3. Mosaic attenuation, suggestive of chronic airways obstruction,
likely smoking-related.
4. 7 mm irregular nodule in the right middle lobe. Non-contrast
chest CT at 6-12 months is recommended. If the nodule is stable at
time of repeat CT, then future CT at 18-24 months (from today's
scan) is considered optional for low-risk patients, but is
recommended for high-risk patients. This recommendation follows the
consensus statement: Guidelines for Management of Incidental
Pulmonary Nodules Detected on CT Images: From the [HOSPITAL]
5. Diffusely enlarged, nodular thyroid gland with nonspecific
enlarged 1.2 cm right prevascular lymph node just inferior to the
right thyroid lobe. Recommend further evaluation with thyroid
ultrasound.
6.  Aortic atherosclerosis ([id number]).
7. Small hiatal hernia.

## 2019-03-22 ENCOUNTER — Other Ambulatory Visit: Payer: Self-pay | Admitting: Internal Medicine

## 2019-03-22 DIAGNOSIS — E049 Nontoxic goiter, unspecified: Secondary | ICD-10-CM

## 2019-03-31 ENCOUNTER — Inpatient Hospital Stay
Admission: RE | Admit: 2019-03-31 | Discharge: 2019-03-31 | Disposition: A | Discharge status: Home / Self Care | Payer: Self-pay | Source: Ambulatory Visit | Attending: Internal Medicine | Admitting: Internal Medicine

## 2019-03-31 ENCOUNTER — Other Ambulatory Visit: Payer: Self-pay | Admitting: Internal Medicine

## 2019-03-31 DIAGNOSIS — Z1231 Encounter for screening mammogram for malignant neoplasm of breast: Secondary | ICD-10-CM

## 2019-04-04 ENCOUNTER — Other Ambulatory Visit: Payer: Medicare Other

## 2019-04-04 ENCOUNTER — Ambulatory Visit
Admission: RE | Admit: 2019-04-04 | Discharge: 2019-04-04 | Disposition: A | Discharge status: Home / Self Care | Payer: Medicare Other | Source: Ambulatory Visit | Attending: Internal Medicine | Admitting: Internal Medicine

## 2019-04-04 ENCOUNTER — Ambulatory Visit: Payer: Medicare Other

## 2019-04-04 ENCOUNTER — Other Ambulatory Visit: Payer: Self-pay

## 2019-04-04 DIAGNOSIS — E049 Nontoxic goiter, unspecified: Secondary | ICD-10-CM | POA: Diagnosis present

## 2020-09-20 ENCOUNTER — Observation Stay
Admission: EM | Admit: 2020-09-20 | Discharge: 2020-09-21 | Disposition: A | Discharge status: Home / Self Care | Payer: Medicare Other | Attending: Internal Medicine | Admitting: Internal Medicine

## 2020-09-20 ENCOUNTER — Emergency Department: Payer: Medicare Other

## 2020-09-20 ENCOUNTER — Other Ambulatory Visit: Payer: Self-pay

## 2020-09-20 DIAGNOSIS — K2100 Gastro-esophageal reflux disease with esophagitis, without bleeding: Secondary | ICD-10-CM

## 2020-09-20 DIAGNOSIS — M5136 Other intervertebral disc degeneration, lumbar region: Secondary | ICD-10-CM

## 2020-09-20 DIAGNOSIS — Z7982 Long term (current) use of aspirin: Secondary | ICD-10-CM | POA: Insufficient documentation

## 2020-09-20 DIAGNOSIS — R079 Chest pain, unspecified: Principal | ICD-10-CM | POA: Insufficient documentation

## 2020-09-20 DIAGNOSIS — M7918 Myalgia, other site: Secondary | ICD-10-CM | POA: Diagnosis present

## 2020-09-20 DIAGNOSIS — R42 Dizziness and giddiness: Secondary | ICD-10-CM | POA: Diagnosis present

## 2020-09-20 DIAGNOSIS — R7989 Other specified abnormal findings of blood chemistry: Secondary | ICD-10-CM

## 2020-09-20 DIAGNOSIS — G250 Essential tremor: Secondary | ICD-10-CM | POA: Diagnosis not present

## 2020-09-20 DIAGNOSIS — K591 Functional diarrhea: Secondary | ICD-10-CM

## 2020-09-20 DIAGNOSIS — R519 Headache, unspecified: Secondary | ICD-10-CM

## 2020-09-20 DIAGNOSIS — R0789 Other chest pain: Secondary | ICD-10-CM | POA: Diagnosis not present

## 2020-09-20 DIAGNOSIS — I10 Essential (primary) hypertension: Secondary | ICD-10-CM | POA: Diagnosis not present

## 2020-09-20 DIAGNOSIS — E119 Type 2 diabetes mellitus without complications: Secondary | ICD-10-CM | POA: Insufficient documentation

## 2020-09-20 DIAGNOSIS — J449 Chronic obstructive pulmonary disease, unspecified: Secondary | ICD-10-CM | POA: Insufficient documentation

## 2020-09-20 DIAGNOSIS — Z79899 Other long term (current) drug therapy: Secondary | ICD-10-CM | POA: Diagnosis not present

## 2020-09-20 DIAGNOSIS — Z20822 Contact with and (suspected) exposure to covid-19: Secondary | ICD-10-CM | POA: Insufficient documentation

## 2020-09-20 DIAGNOSIS — F17290 Nicotine dependence, other tobacco product, uncomplicated: Secondary | ICD-10-CM | POA: Diagnosis not present

## 2020-09-20 DIAGNOSIS — Z86010 Personal history of colonic polyps: Secondary | ICD-10-CM | POA: Diagnosis not present

## 2020-09-20 DIAGNOSIS — M5030 Other cervical disc degeneration, unspecified cervical region: Secondary | ICD-10-CM | POA: Diagnosis present

## 2020-09-20 DIAGNOSIS — R778 Other specified abnormalities of plasma proteins: Secondary | ICD-10-CM

## 2020-09-20 HISTORY — DX: Chest pain, unspecified: R07.9

## 2020-09-20 LAB — COMPREHENSIVE METABOLIC PANEL
ALT: 20 U/L (ref 0–44)
AST: 20 U/L (ref 15–41)
Albumin: 4.1 g/dL (ref 3.5–5.0)
Alkaline Phosphatase: 93 U/L (ref 38–126)
Anion gap: 9 (ref 5–15)
BUN: 29 mg/dL — ABNORMAL HIGH (ref 8–23)
CO2: 23 mmol/L (ref 22–32)
Calcium: 9.5 mg/dL (ref 8.9–10.3)
Chloride: 105 mmol/L (ref 98–111)
Creatinine, Ser: 1.36 mg/dL — ABNORMAL HIGH (ref 0.44–1.00)
GFR, Estimated: 41 mL/min — ABNORMAL LOW (ref >60)
Glucose, Bld: 184 mg/dL — ABNORMAL HIGH (ref 70–99)
Potassium: 3.4 mmol/L — ABNORMAL LOW (ref 3.5–5.1)
Sodium: 137 mmol/L (ref 135–145)
Total Bilirubin: 0.6 mg/dL (ref 0.3–1.2)
Total Protein: 7 g/dL (ref 6.5–8.1)

## 2020-09-20 LAB — URINALYSIS, COMPLETE (UACMP) WITH MICROSCOPIC
Bacteria, UA: NONE SEEN
Bilirubin Urine: NEGATIVE
Glucose, UA: NEGATIVE mg/dL
Hgb urine dipstick: NEGATIVE
Ketones, ur: NEGATIVE mg/dL
Nitrite: NEGATIVE
Protein, ur: NEGATIVE mg/dL
Specific Gravity, Urine: 1.018 (ref 1.005–1.030)
WBC, UA: >50 WBC/hpf — ABNORMAL HIGH (ref 0–5)
pH: 5 (ref 5.0–8.0)

## 2020-09-20 LAB — CBC
HCT: 42.4 % (ref 36.0–46.0)
Hemoglobin: 14.1 g/dL (ref 12.0–15.0)
MCH: 31.9 pg (ref 26.0–34.0)
MCHC: 33.3 g/dL (ref 30.0–36.0)
MCV: 95.9 fL (ref 80.0–100.0)
Platelets: 268 10*3/uL (ref 150–400)
RBC: 4.42 MIL/uL (ref 3.87–5.11)
RDW: 12.5 % (ref 11.5–15.5)
WBC: 9.6 10*3/uL (ref 4.0–10.5)
nRBC: 0 % (ref 0.0–0.2)

## 2020-09-20 LAB — TROPONIN I (HIGH SENSITIVITY)
Troponin I (High Sensitivity): 28 ng/L — ABNORMAL HIGH (ref <18)
Troponin I (High Sensitivity): 73 ng/L — ABNORMAL HIGH (ref <18)

## 2020-09-20 LAB — RESP PANEL BY RT-PCR (FLU A&B, COVID) ARPGX2
Influenza A by PCR: NEGATIVE
Influenza B by PCR: NEGATIVE
SARS Coronavirus 2 by RT PCR: NEGATIVE

## 2020-09-20 MED ORDER — SODIUM CHLORIDE 0.9 % IV BOLUS
1000.0000 mL | Freq: Once | INTRAVENOUS | Status: AC
  Administered 2020-09-20: 1000 mL via INTRAVENOUS

## 2020-09-20 MED ORDER — CLOPIDOGREL BISULFATE 75 MG PO TABS
75.0000 mg | ORAL_TABLET | Freq: Every day | ORAL | Status: DC
Start: 2020-09-21 — End: 2020-09-21
  Administered 2020-09-21: 75 mg via ORAL
  Filled 2020-09-20: qty 1

## 2020-09-20 MED ORDER — SODIUM CHLORIDE 0.9 % IV SOLN
1.0000 g | Freq: Once | INTRAVENOUS | Status: AC
  Administered 2020-09-20: 1 g via INTRAVENOUS
  Filled 2020-09-20: qty 10

## 2020-09-20 MED ORDER — ISOSORBIDE MONONITRATE ER 30 MG PO TB24
30.0000 mg | ORAL_TABLET | Freq: Every day | ORAL | Status: DC
  Administered 2020-09-21: 30 mg via ORAL
  Filled 2020-09-20: qty 1

## 2020-09-20 MED ORDER — ONDANSETRON HCL 4 MG/2ML IJ SOLN: 4.0000 mg | Freq: Four times a day (QID) | INTRAMUSCULAR | Status: DC | PRN

## 2020-09-20 MED ORDER — LINAGLIPTIN 5 MG PO TABS
5.0000 mg | ORAL_TABLET | Freq: Every day | ORAL | Status: DC
  Administered 2020-09-21: 5 mg via ORAL
  Filled 2020-09-20 (×2): qty 1

## 2020-09-20 MED ORDER — ONDANSETRON HCL 4 MG PO TABS: 4.0000 mg | ORAL_TABLET | Freq: Four times a day (QID) | ORAL | Status: DC | PRN

## 2020-09-20 MED ORDER — SODIUM CHLORIDE 0.9 % IV BOLUS
500.0000 mL | Freq: Once | INTRAVENOUS | Status: AC
  Administered 2020-09-20: 500 mL via INTRAVENOUS

## 2020-09-20 MED ORDER — FLUTICASONE FUROATE-VILANTEROL 100-25 MCG/INH IN AEPB
1.0000 | INHALATION_SPRAY | Freq: Every day | RESPIRATORY_TRACT | Status: DC
  Filled 2020-09-20: qty 28

## 2020-09-20 MED ORDER — ENOXAPARIN SODIUM 60 MG/0.6ML ~~LOC~~ SOLN
0.5000 mg/kg | SUBCUTANEOUS | Status: DC
  Administered 2020-09-21: 50 mg via SUBCUTANEOUS
  Filled 2020-09-20: qty 0.6

## 2020-09-20 MED ORDER — ASPIRIN 81 MG PO CHEW
324.0000 mg | CHEWABLE_TABLET | Freq: Once | ORAL | Status: AC
  Administered 2020-09-20: 324 mg via ORAL
  Filled 2020-09-20: qty 4

## 2020-09-20 MED ORDER — INSULIN ASPART 100 UNIT/ML ~~LOC~~ SOLN
0.0000 [IU] | Freq: Three times a day (TID) | SUBCUTANEOUS | Status: DC
  Administered 2020-09-21: 2 [IU] via SUBCUTANEOUS
  Filled 2020-09-20: qty 1

## 2020-09-20 MED ORDER — ALBUTEROL SULFATE HFA 108 (90 BASE) MCG/ACT IN AERS
2.0000 | INHALATION_SPRAY | Freq: Four times a day (QID) | RESPIRATORY_TRACT | Status: DC | PRN
  Filled 2020-09-20: qty 6.7

## 2020-09-20 MED ORDER — OXYCODONE HCL ER 15 MG PO T12A
60.0000 mg | EXTENDED_RELEASE_TABLET | Freq: Three times a day (TID) | ORAL | Status: DC
Start: 2020-09-20 — End: 2020-09-21
  Administered 2020-09-21 (×2): 60 mg via ORAL
  Filled 2020-09-20 (×2): qty 4

## 2020-09-20 MED ORDER — LACTATED RINGERS IV SOLN: INTRAVENOUS | Status: DC

## 2020-09-20 MED ORDER — ACETAMINOPHEN 325 MG PO TABS: 325.0000 mg | ORAL_TABLET | Freq: Four times a day (QID) | ORAL | Status: DC | PRN

## 2020-09-20 MED ORDER — KETOROLAC TROMETHAMINE 30 MG/ML IJ SOLN
15.0000 mg | Freq: Once | INTRAMUSCULAR | Status: AC
  Administered 2020-09-20: 15 mg via INTRAVENOUS
  Filled 2020-09-20: qty 1

## 2020-09-20 MED ORDER — PRAVASTATIN SODIUM 40 MG PO TABS
40.0000 mg | ORAL_TABLET | Freq: Every day | ORAL | Status: DC
  Administered 2020-09-21: 40 mg via ORAL
  Filled 2020-09-20 (×2): qty 1

## 2020-09-20 MED ORDER — ACETAMINOPHEN 650 MG RE SUPP: 325.0000 mg | Freq: Four times a day (QID) | RECTAL | Status: DC | PRN

## 2020-09-20 MED ORDER — PANTOPRAZOLE SODIUM 40 MG PO TBEC
40.0000 mg | DELAYED_RELEASE_TABLET | Freq: Every day | ORAL | Status: DC
  Administered 2020-09-21: 40 mg via ORAL
  Filled 2020-09-20: qty 1

## 2020-09-20 MED ORDER — GABAPENTIN 400 MG PO CAPS
400.0000 mg | ORAL_CAPSULE | Freq: Every day | ORAL | Status: DC
  Administered 2020-09-21: 400 mg via ORAL
  Filled 2020-09-20: qty 1

## 2020-09-20 MED ORDER — OXYCODONE HCL ER 15 MG PO T12A: 60.0000 mg | EXTENDED_RELEASE_TABLET | Freq: Three times a day (TID) | ORAL | Status: DC | PRN

## 2020-09-20 MED ORDER — ASCORBIC ACID 500 MG PO TABS
500.0000 mg | ORAL_TABLET | Freq: Every day | ORAL | Status: DC
  Administered 2020-09-21: 500 mg via ORAL
  Filled 2020-09-20: qty 1

## 2020-09-20 MED ORDER — CITALOPRAM HYDROBROMIDE 20 MG PO TABS
20.0000 mg | ORAL_TABLET | Freq: Every day | ORAL | Status: DC
Start: 2020-09-21 — End: 2020-09-21
  Administered 2020-09-21: 20 mg via ORAL
  Filled 2020-09-20: qty 1

## 2020-09-20 MED ORDER — ASPIRIN EC 81 MG PO TBEC
81.0000 mg | DELAYED_RELEASE_TABLET | Freq: Every day | ORAL | Status: DC
  Administered 2020-09-21: 81 mg via ORAL
  Filled 2020-09-20: qty 1

## 2020-09-20 MED ORDER — NICOTINE 14 MG/24HR TD PT24
14.0000 mg | MEDICATED_PATCH | Freq: Every day | TRANSDERMAL | Status: DC
  Filled 2020-09-20: qty 1

## 2020-09-20 NOTE — Progress Notes (Signed)
PHARMACIST - PHYSICIAN COMMUNICATION  CONCERNING:  Enoxaparin (Lovenox) for DVT Prophylaxis    RECOMMENDATION: Patient was prescribed enoxaprin 40mg  q24 hours for VTE prophylaxis.   Filed Weights   09/20/20 1925  Weight: 102.1 kg (225 lb)    Body mass index is 38.62 kg/m.  Estimated Creatinine Clearance: 43.5 mL/min (A) (by C-G formula based on SCr of 1.36 mg/dL (H)).   Based on [facility] policy patient is candidate for enoxaparin 0.5mg /kg TBW SQ every 24 hours based on BMI being >30.  DESCRIPTION: Pharmacy has adjusted enoxaparin dose per [facility] policy.  Patient is now receiving enoxaparin 0.5 mg/kg every 24 hours   [facility], PharmD, [facility] 09/20/2020 11:18 PM

## 2020-09-20 NOTE — Discharge Instructions (Signed)
Please call your cardiologist tomorrow morning to arrange a follow-up appointment for consideration of a Holter monitor.  Return to the emergency department for any rapid heart rate, chest pain, or any other symptom personally concerning to yourself.

## 2020-09-20 NOTE — H&P (Signed)
History and Physical   KARE DADO YBO:[id number] DOB: [birth year] DOA: 09/20/2020  PCP: Lyndon Code, MD  Outpatient Specialists: [facility] Pain [facility] Patient coming from: home   I have personally briefly reviewed patient's old medical records in [facility] EMR.  Chief Concern: dizziness  HPI: Stephanie Small is a 75 y.o. female with medical history significant for chronic pain syndrome, hypertension, long-term use of opioid medication for chronic neck and low back pain, right knee arthritis, hyperlipidemia, remote history of CAD status post PCI over 10 years ago, non-insulin-dependent diabetes mellitus, presents to the emergency department for chief concerns of dizziness.  She further reports that she has been having diarrhea persistently since Friday, September 14, 2020.  She states the bowel movements have been watery and yellow.  She denies any diarrhea on day of admission.  She denies changes to diet, sick contacts, and her family members having symptoms as her.  She reports the dizziness was today with rest and worse with ambulation.  She denies falling and head trauma.  She denies nausea and vomiting, fever, cough, chest pain, abdominal pain.  She experienced 1 episode of feeling like her heart was going to jump out of her chest.  She reports that she does not have that sensation of palpitation at this time.  Social history: lives with daughter and son-in-law. Her daughter helps take care of her and her son-in-law cooks for her. She smokes about 0.5 - 1 ppd and has been smoking for 38 years. At [place], she smoked 1 ppd. She denies recreational drugs. She is retired at this time and formerly worked in [employer].  Vaccination: she is vaccinated for covid 19 with pfizer for two doses and flu vaccine.  ROS: Constitutional: no weight change, no fever ENT/Mouth: no sore throat, no rhinorrhea Eyes: no eye pain, no vision changes Cardiovascular: no chest pain, no  dyspnea,  no edema, no palpitations Respiratory: no cough, no sputum, no wheezing Gastrointestinal: no nausea, no vomiting, + diarrhea, no constipation Genitourinary: no urinary incontinence, no dysuria, no hematuria Musculoskeletal: no arthralgias, no myalgias Skin: no skin lesions, no pruritus, Neuro: + weakness, no loss of consciousness, no syncope Psych: no anxiety, no depression, no decrease appetite Heme/Lymph: no bruising, no bleeding  ED Course: Discussed with ED provider, patient requiring hospitalization due to dizziness, palpitation, elevated troponin.  Vitals in the emergency department was reassuring with temperatures of 97.6, respiration rate of 20, heart rate of 92 and now 72, initial blood pressure was 92/68 and improved with fluids.  Labs in the emergency department was remarkable for serum potassium 3.4, chloride 105, serum creatinine of 1.36, BUN 29, nonfasting blood glucose 184, GFR 41, troponin initially was 28 and increased to 73, and CBC was unremarkable.  ED provider gave patient normal saline 1 L bolus x2, ceftriaxone 1 g IV dose, aspirin loading dose of 324, Toradol 50 mg once.  Assessment/Plan  Principal Problem:   Chest pain Active Problems:   Benign essential tremor   DDD (degenerative disc disease), cervical   DDD (degenerative disc disease), lumbar   Essential hypertension   Gastroesophageal reflux disease with esophagitis   Myofascial muscle pain   Polypharmacy   Chest pain/palpitation-at this time patient does not have any chest pain, low clinical suspicion for ACS -EKG in the emergency department was sinus rhythm with rate of 69, QTc 436 no ST-T wave elevation -Third troponin HS ordered timed for 2321 -At this time no clinical suspicion for ACS  and therefore heparin was not started -If heparin is elevated with positive delta, would recommend a.m. team to order echo to assess EF function -Patient does not endorse shortness of breath or lower  extremity swelling therefore BNP was not ordered  Hypotension-secondary to volume loss due to GI loss -Responsive to fluid -LR IVF at 125 mL/h, 1 day ordered -Admit to progressive cardiac unit  Acute kidney injury-likely prerenal and secondary to GI loss -Status post 2 L bolus of normal saline per ED provider -LR 125 mL/h for 1 day ordered -BMP in the a.m.  Remote history of CAD-resumed pravastatin 40 mg daily -Would recommend PCP to start patient on high intensity as patient also has non-insulin-dependent diabetes mellitus -Imdur 30 mg daily ordered for 09/21/2020, Plavix 5 mg daily  History Hypertension-resumed Imdur for 09/21/20 -Losartan 100 mg daily has not been reordered due to acute kidney injury -Also pending med reconciliation by pharmacy tech  Diarrhea-work-up in progress -GI panel ordered, C. 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 team to complete med rec  Chart reviewed.   DVT prophylaxis: Enoxaparin 40 mg subcutaneous every 24 hours Code Status: Full code Diet: Heart healthy/carb modified Family Communication: Updated eldest daughter at bedside Disposition Plan: Pending clinical course Consults called: None at this time Admission status: Observation, progressive cardiac  Past Medical History:  Diagnosis Date  . Arthritis   . COPD (chronic obstructive pulmonary disease) (HCC)   . Diabetes mellitus without complication (HCC)   . Glaucoma 2010  . Hypertension 2005  . Personal history of colonic polyps   . Thyroid cyst    Past Surgical History:  Procedure Laterality Date  . CAROTID STENT INSERTION  2011  . CHOLECYSTECTOMY  2009  . COLONOSCOPY  2007  . COLONOSCOPY WITH  PROPOFOL N/A 08/18/2016   Procedure: COLONOSCOPY WITH PROPOFOL;  Surgeon: Midge Minium, MD;  Location: [facility];  Service: Endoscopy;  Laterality: N/A;  . FOOT SURGERY    . SHOULDER SURGERY Bilateral 2004  . UPPER GI ENDOSCOPY     Social History:  reports that she has been smoking e-cigarettes. She has never used smokeless tobacco. She reports that she does not drink alcohol and does not use drugs.  Allergies  Allergen Reactions  . Shellfish Allergy Rash   Family History  Problem Relation Age of Onset  . Hypertension Mother    Family history: Family history reviewed and not pertinent  Prior to Admission medications   Medication Sig Start Date End Date Taking? Authorizing Provider  albuterol (PROVENTIL HFA;VENTOLIN HFA) 108 (90 Base) MCG/ACT inhaler Inhale into the lungs every 6 (six) hours as needed for wheezing or shortness of breath.    [provider]  aspirin 81 MG tablet Take 81 mg by mouth daily.    [provider]  citalopram (CELEXA) 20 MG tablet Take 20 mg by mouth daily.    [provider]  clopidogrel (PLAVIX) 75 MG tablet Take 75 mg by mouth daily.    [provider]  diclofenac sodium (VOLTAREN) 1 % GEL Apply topically as needed.    [provider]  fluticasone furoate-vilanterol (BREO ELLIPTA) 100-25 MCG/INH AEPB Inhale 1 puff into the lungs daily.    [provider]  gabapentin (NEURONTIN) 400 MG capsule Take 400 mg by mouth  daily.    [provider]  isosorbide mononitrate (IMDUR) 30 MG 24 hr tablet Take 30 mg by mouth daily.    [provider]  levofloxacin (LEVAQUIN) 750 MG tablet Take 1 tablet (750 mg total) by mouth daily. Patient not taking: Reported on 04/27/2017 10/24/15   [place] SirenSainani, Vivek J, MD  losartan (COZAAR) 100 MG tablet Take 1 tablet by mouth daily. 04/13/17   [provider]  methocarbamol (ROBAXIN) 500 MG tablet Take 500 mg by mouth 3 (three) times daily.    [provider]  oxyCODONE (OXYCONTIN) 60 MG 12 hr tablet Take by mouth 3 (three) times daily.    [provider]  pantoprazole (PROTONIX) 40 MG tablet Take 40 mg by mouth daily.    [provider]  polyethylene glycol (GOLYTELY) 236 g solution Drink one 8 oz glass every 20 mins until stools are clear 08/15/16   Midge MiniumWohl, Darren, MD  pravastatin (PRAVACHOL) 40 MG tablet Take 40 mg by mouth daily.    [provider]  predniSONE (DELTASONE) 10 MG tablet 40 mg daily for 4 days. 04/27/17   Governor RooksLord, Rebecca, MD  saxagliptin HCl (ONGLYZA) 2.5 MG TABS tablet Take 2.5 mg by mouth daily.    [provider]  SUMAtriptan (IMITREX) 100 MG tablet Take 100 mg by mouth every 2 (two) hours as needed for migraine. May repeat in 2 hours if headache persists or recurs.    [provider]  vitamin C (ASCORBIC ACID) 500 MG tablet Take 500 mg by mouth daily.    [provider]   Physical Exam: Vitals:   09/20/20 2045 09/20/20 2100 09/20/20 2115 09/20/20 2130  BP: 102/73 98/72 98/74  102/68  Pulse: 69 71 63 64  Resp: 15 (!) 21 (!) 26 18  Temp:      TempSrc:      SpO2: 95% 97% 95% 95%  Weight:      Height:       Constitutional: appears age-appropriate, NAD, calm, comfortable Eyes: PERRL, lids and conjunctivae normal ENMT: Mucous membranes are moist. Posterior pharynx clear of any exudate or lesions. Age-appropriate dentition. Hearing appropriate Neck: normal, supple, no masses, no thyromegaly Respiratory: clear to auscultation bilaterally, no wheezing, no crackles. Normal respiratory effort. No accessory muscle use.  Cardiovascular: Regular rate and rhythm, no murmurs / rubs / gallops. No extremity edema. 2+ pedal pulses. No carotid bruits.  Abdomen: obese abdomen, no tenderness, no masses palpated, no hepatosplenomegaly. Bowel sounds positive.  Musculoskeletal: no clubbing / cyanosis. No joint deformity upper and lower extremities. Good ROM, no contractures, no  atrophy. Normal muscle tone.  Skin: no rashes, lesions, ulcers. No induration Neurologic: Sensation intact. Strength 5/5 in all 4.  Psychiatric: Normal judgment and insight. Alert and oriented x 3. Normal mood.   EKG: independently reviewed, showing sinus rhythm with rate of 69, QTc 436  Chest x-ray on Admission: I personally reviewed and I agree with radiologist reading as below.  CT Head Wo Contrast  Result Date: 09/20/2020 CLINICAL DATA:  75 year old female with worsening headache. EXAM: CT HEAD WITHOUT CONTRAST TECHNIQUE: Contiguous axial images were obtained from the base of the skull through the vertex without intravenous contrast. COMPARISON:  Head CT dated 10/21/2014. FINDINGS: Brain: The ventricles and sulci appropriate size for patient's age. The gray-white matter discrimination is preserved. There is no acute intracranial hemorrhage. No mass effect or midline shift. No extra-axial fluid collection. Vascular: No hyperdense vessel or unexpected calcification. Skull: Normal. Negative for fracture or focal  lesion. Sinuses/Orbits: No acute finding. Other: None IMPRESSION: Unremarkable noncontrast CT of the brain. Electronically Signed   By: Elgie Collard M.D.   On: 09/20/2020 20:06   DG Chest Portable 1 View  Result Date: 09/20/2020 CLINICAL DATA:  Chest pain EXAM: PORTABLE CHEST 1 VIEW COMPARISON:  April 27, 2017 chest radiograph and chest CT FINDINGS: There is chronic interstitial thickening without edema or airspace opacity. Heart is upper normal in size with pulmonary vascularity normal. There is aortic atherosclerosis. There is arthropathy in each shoulder. There is slight leftward deviation of the upper thoracic trachea, likely due to known thyroid enlargement IMPRESSION: Chronic interstitial thickening, likely due to chronic bronchitis. No edema or airspace opacity. Stable cardiac silhouette. Mild deviation of the upper thoracic trachea to the left, likely due to known thyroid  prominence. Aortic Atherosclerosis (ICD10-I70.0). Electronically Signed   By: Bretta Bang III M.D.   On: 09/20/2020 19:59   Labs on Admission: I have personally reviewed following labs  CBC: Recent Labs  Lab 09/20/20 1933  WBC 9.6  HGB 14.1  HCT 42.4  MCV 95.9  PLT 268   Basic Metabolic Panel: Recent Labs  Lab 09/20/20 1933  NA 137  K 3.4*  CL 105  CO2 23  GLUCOSE 184*  BUN 29*  CREATININE 1.36*  CALCIUM 9.5   GFR: Estimated Creatinine Clearance: 43.5 mL/min (A) (by C-G formula based on SCr of 1.36 mg/dL (H)).  Liver Function Tests: Recent Labs  Lab 09/20/20 1933  AST 20  ALT 20  ALKPHOS 93  BILITOT 0.6  PROT 7.0  ALBUMIN 4.1   Urine analysis:    Component Value Date/Time   COLORURINE AMBER (A) 09/20/2020 2044   APPEARANCEUR CLOUDY (A) 09/20/2020 2044   LABSPEC 1.018 09/20/2020 2044   PHURINE 5.0 09/20/2020 2044   GLUCOSEU NEGATIVE 09/20/2020 2044   HGBUR NEGATIVE 09/20/2020 2044   BILIRUBINUR NEGATIVE 09/20/2020 2044   KETONESUR NEGATIVE 09/20/2020 2044   PROTEINUR NEGATIVE 09/20/2020 2044   NITRITE NEGATIVE 09/20/2020 2044   LEUKOCYTESUR LARGE (A) 09/20/2020 2044   Amy N Cox D.O. [facility]  If 7PM-7AM, please contact overnight-coverage provider If 7AM-7PM, please contact day coverage provider [email or web address]  09/20/2020, 11:14 PM

## 2020-09-20 NOTE — ED Provider Notes (Signed)
[facility] Emergency Department Provider Note  Time seen: 7:54 PM  I have reviewed the triage vital signs and the nursing notes.   HISTORY  Chief Complaint Dizziness, Headache, and Abdominal Pain   HPI Stephanie Small is a 75 y.o. female with a past medical history of COPD, diabetes, hypertension, presents emergency department for palpitations and headache.  According to the patient  she has been experiencing diarrhea for the past 2 days although states none today, states around 2 PM or so today she developed a headache felt dizzy and felt like her heart was racing in her chest.  Per EMS family states there is a mild cognitive impairment which is baseline for the patient.  Patient denies any nausea or vomiting.  No known fever cough congestion or shortness of breath.  Denies any chest pain or current palpitations.  Patient was complaining of some mild epigastric pain but has since resolved as well.  Past Medical History:  Diagnosis Date  . Arthritis   . COPD (chronic obstructive pulmonary disease) (HCC)   . Diabetes mellitus without complication (HCC)   . Glaucoma 2010  . Hypertension 2005  . Personal history of colonic polyps   . Thyroid cyst     Patient Active Problem List   Diagnosis Date Noted  . Dilated cbd, acquired 03/18/2017  . Pain medication agreement signed 03/05/2017  . Atypical squamous cells of undetermined significance (ASCUS) on Papanicolaou smear of cervix 10/08/2016  . History of adenomatous polyp of colon   . Benign essential tremor 12/13/2015  . Essential hypertension 10/25/2015  . Goiter 10/25/2015  . Respiratory distress 10/25/2015  . Thyroid cyst 10/25/2015  . Type 2 diabetes mellitus without complication (HCC) 10/25/2015  . Wheezing 10/25/2015  . COPD with hypoxia (HCC) 10/23/2015  . Gastroesophageal reflux disease with esophagitis 01/18/2015  . 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 Myofascial muscle pain 12/14/2014  . DDD  (degenerative disc disease), cervical 07/31/2014  . DDD (degenerative disc disease), lumbar 07/31/2014  . Polypharmacy 05/01/2014  . Neck pain 10/07/2012  . Other chronic pain 10/07/2012  . Degenerative disk disease 10/07/2012    Past Surgical History:  Procedure Laterality Date  . CAROTID STENT INSERTION  2011  . CHOLECYSTECTOMY  2009  . COLONOSCOPY  2007  . COLONOSCOPY WITH PROPOFOL N/A 08/18/2016   Procedure: COLONOSCOPY WITH PROPOFOL;  Surgeon: Midge Minium, MD;  Location: [facility];  Service: Endoscopy;  Laterality: N/A;  . FOOT SURGERY    . SHOULDER SURGERY Bilateral 2004  . UPPER GI ENDOSCOPY      Prior to Admission medications   Medication Sig Start Date End Date Taking? Authorizing Provider  albuterol (PROVENTIL HFA;VENTOLIN HFA) 108 (90 Base) MCG/ACT inhaler Inhale into the lungs every 6 (six) hours as needed for wheezing or shortness of breath.    [provider]  aspirin 81 MG tablet Take 81 mg by mouth daily.    [provider]  citalopram (CELEXA) 20 MG tablet Take 20 mg by mouth daily.    [provider]  clopidogrel (PLAVIX) 75 MG tablet Take 75 mg by mouth daily.    [provider]  diclofenac sodium (VOLTAREN) 1 % GEL Apply topically as needed.    [provider]  fluticasone furoate-vilanterol (BREO ELLIPTA) 100-25 MCG/INH AEPB Inhale 1 puff into the lungs daily.    [provider]  gabapentin (NEURONTIN) 400 MG capsule Take 400 mg by mouth daily.    [provider]  isosorbide mononitrate (  IMDUR) 30 MG 24 hr tablet Take 30 mg by mouth daily.    [provider]  levofloxacin (LEVAQUIN) 750 MG tablet Take 1 tablet (750 mg total) by mouth daily. Patient not taking: Reported on 04/27/2017 10/24/15   Houston Siren, MD  losartan (COZAAR) 100 MG tablet Take 1 tablet by mouth daily. 04/13/17   [provider]  methocarbamol (ROBAXIN) 500 MG tablet Take 500 mg by mouth 3 (three)  times daily.    [provider]  oxyCODONE (OXYCONTIN) 60 MG 12 hr tablet Take by mouth 3 (three) times daily.    [provider]  pantoprazole (PROTONIX) 40 MG tablet Take 40 mg by mouth daily.    [provider]  polyethylene glycol (GOLYTELY) 236 g solution Drink one 8 oz glass every 20 mins until stools are clear 08/15/16   Midge Minium, MD  pravastatin (PRAVACHOL) 40 MG tablet Take 40 mg by mouth daily.    [provider]  predniSONE (DELTASONE) 10 MG tablet 40 mg daily for 4 days. 04/27/17   Governor Rooks, MD  saxagliptin HCl (ONGLYZA) 2.5 MG TABS tablet Take 2.5 mg by mouth daily.    [provider]  SUMAtriptan (IMITREX) 100 MG tablet Take 100 mg by mouth every 2 (two) hours as needed for migraine. May repeat in 2 hours if headache persists or recurs.    [provider]  vitamin C (ASCORBIC ACID) 500 MG tablet Take 500 mg by mouth daily.    [provider]    Allergies  Allergen Reactions  . Shellfish Allergy Rash    Family History  Problem Relation Age of Onset  . Hypertension Mother     Social History Social History   Tobacco Use  . Smoking status: Current Every Day Smoker    Types: E-cigarettes  . Smokeless tobacco: Never Used  Substance Use Topics  . Alcohol use: No  . Drug use: No    Review of Systems Constitutional: Negative for fever. Cardiovascular: Mild chest discomfort earlier today due to heart racing per patient.  This is since resolved. Respiratory: Negative for shortness of breath.  Negative for cough. Gastrointestinal: Negative for abdominal pain, vomiting.  States 2 days of diarrhea but none today. Genitourinary: Negative for urinary compaints Musculoskeletal: Negative for musculoskeletal complaints Neurological: Negative for headache All other ROS negative  ____________________________________________   PHYSICAL EXAM:  VITAL SIGNS: ED Triage Vitals  Enc Vitals Group     BP  09/20/20 1924 (!) 87/64     Pulse Rate 09/20/20 1924 92     Resp 09/20/20 1924 20     Temp 09/20/20 1924 97.6 F (36.4 C)     Temp Source 09/20/20 1924 Oral     SpO2 09/20/20 1924 95 %     Weight 09/20/20 1925 225 lb (102.1 kg)     Height 09/20/20 1925 5\' 4"  (1.626 m)     Head Circumference --      Peak Flow --      Pain Score 09/20/20 1925 0     Pain Loc --      Pain Edu? --      Excl. in GC? --     Constitutional: Alert and oriented. Well appearing and in no distress. Eyes: Normal exam ENT      Head: Normocephalic and atraumatic.      Mouth/Throat: Mucous membranes are moist. Cardiovascular: Normal rate, regular rhythm.  Respiratory: Normal respiratory effort without tachypnea nor retractions. Breath sounds are  clear  Gastrointestinal: Soft and nontender. No distention.  Benign abdominal exam. Musculoskeletal: Nontender with normal range of motion in all extremities. Neurologic:  Normal speech and language. No gross focal neurologic deficits  Skin:  Skin is warm, dry and intact.  Psychiatric: Mood and affect are normal.   ____________________________________________    EKG  EKG viewed and interpreted by myself shows sinus tachycardia at 99 bpm with a narrow QRS, normal axis, normal intervals, nonspecific but no concerning ST changes.  Repeat EKG viewed and interpreted by myself shows a normal sinus rhythm at 69 bpm with a narrow QRS, normal axis, normal intervals, nonspecific ST changes.  No ST elevation.  ____________________________________________    RADIOLOGY  Chest x-ray shows chronic findings without acute abnormality. CT scan head is negative. ____________________________________________   INITIAL IMPRESSION / ASSESSMENT AND PLAN / ED COURSE  Pertinent labs & imaging results that were available during my care of the patient were reviewed by me and considered in my medical decision making (see chart for details).   Patient presents to the emergency  department for dizziness palpitations and a headache.  Overall patient appears well states her symptoms have since resolved.  Patient is borderline hypotensive currently 92/68.  Given her recent episodes of diarrhea we will IV hydrate the patient and check labs including electrolytes, cardiac enzyme and obtain a CT scan of the head.  We will treat the patient's headache while in the emergency department while awaiting for the results.  Patient agreeable to plan of care.  Patient's labs show very slight troponin elevation otherwise largely within normal limits.  CT scan of the head and chest x-ray showed no acute abnormalities.  Patient did experience an additional episode of chest pain lasting several minutes approximately 30 minutes ago, repeat EKG was unchanged.  Repeat troponin is pending.  Patient care signed out to oncoming provider.   Mackynzie Woolford Anguiano was evaluated in Emergency Department on 09/20/2020 for the symptoms described in the history of present illness. She was evaluated in the context of the global COVID-19 pandemic, which necessitated consideration that the patient might be at risk for infection with the SARS-CoV-2 virus that causes COVID-19. Institutional protocols and algorithms that pertain to the evaluation of patients at risk for COVID-19 are in a state of rapid change based on information released by regulatory bodies including the CDC and federal and state organizations. These policies and algorithms were followed during the patient's care in the ED.  ____________________________________________   FINAL CLINICAL IMPRESSION(S) / ED DIAGNOSES  Headache Palpitations Dizziness   Minna Antis, MD 09/20/20 2047

## 2020-09-20 NOTE — ED Triage Notes (Addendum)
Pt c/o diarrhea x2 days, denies diarrhea today. Presents today with headache and dizziness. States earlier today it felt like her heart was going to jump out of her chest and she was having left side abdominal/epigastric abdominal pain, pain is resolved upon arrival to ED.  Per EMS, family states there is preexisting cognitive impairment. Unsure of diagnosis etc.

## 2020-09-20 NOTE — ED Provider Notes (Signed)
-----------------------------------------   8:58 PM on 09/20/2020 -----------------------------------------  Blood pressure 107/71, pulse 75, temperature 97.6 F (36.4 C), temperature source Oral, resp. rate 17, height 5\' 4"  (1.626 m), weight 102.1 kg, SpO2 96 %.  Assuming care from Dr. .  In short, Stephanie Small is a 75 y.o. female with a chief complaint of Dizziness, Headache, and Abdominal Pain .  Refer to the original H&P for additional details.  The current plan of care is to follow-up UA results and repeat troponin.  ----------------------------------------- 10:11 PM on 09/20/2020 -----------------------------------------  UA is concerning for infection, we will send for culture and treat with Rocephin.  Additionally, patient's repeat troponin is uptrending.  She continues to deny any chest pain or shortness of breath at this time but earlier episode is concerning for possible cardiac etiology.  We will load with aspirin and plan to discuss with hospitalist for admission.  Given she is pain-free with a relatively mild troponin elevation, we will hold off on heparin for now.    09/22/2020, MD 09/20/20 2212

## 2020-09-21 ENCOUNTER — Observation Stay (HOSPITAL_BASED_OUTPATIENT_CLINIC_OR_DEPARTMENT_OTHER)
Admit: 2020-09-21 | Discharge: 2020-09-21 | Disposition: A | Discharge status: Home / Self Care | Payer: Medicare Other | Attending: Internal Medicine | Admitting: Internal Medicine

## 2020-09-21 DIAGNOSIS — R7989 Other specified abnormal findings of blood chemistry: Secondary | ICD-10-CM | POA: Diagnosis not present

## 2020-09-21 DIAGNOSIS — M5030 Other cervical disc degeneration, unspecified cervical region: Secondary | ICD-10-CM | POA: Diagnosis not present

## 2020-09-21 DIAGNOSIS — G250 Essential tremor: Secondary | ICD-10-CM | POA: Diagnosis not present

## 2020-09-21 DIAGNOSIS — I10 Essential (primary) hypertension: Secondary | ICD-10-CM

## 2020-09-21 DIAGNOSIS — R079 Chest pain, unspecified: Secondary | ICD-10-CM | POA: Diagnosis not present

## 2020-09-21 DIAGNOSIS — R778 Other specified abnormalities of plasma proteins: Secondary | ICD-10-CM | POA: Diagnosis not present

## 2020-09-21 LAB — GLUCOSE, CAPILLARY
Glucose-Capillary: 107 mg/dL — ABNORMAL HIGH (ref 70–99)
Glucose-Capillary: 168 mg/dL — ABNORMAL HIGH (ref 70–99)

## 2020-09-21 LAB — CBC
HCT: 39.5 % (ref 36.0–46.0)
Hemoglobin: 13.1 g/dL (ref 12.0–15.0)
MCH: 32.1 pg (ref 26.0–34.0)
MCHC: 33.2 g/dL (ref 30.0–36.0)
MCV: 96.8 fL (ref 80.0–100.0)
Platelets: 228 10*3/uL (ref 150–400)
RBC: 4.08 MIL/uL (ref 3.87–5.11)
RDW: 12.6 % (ref 11.5–15.5)
WBC: 8.2 10*3/uL (ref 4.0–10.5)
nRBC: 0 % (ref 0.0–0.2)

## 2020-09-21 LAB — ECHOCARDIOGRAM COMPLETE
AR max vel: 2.17 cm2
AV Area VTI: 2.59 cm2
AV Area mean vel: 1.92 cm2
AV Mean grad: 5 mmHg
AV Peak grad: 8.2 mmHg
Ao pk vel: 1.43 m/s
Area-P 1/2: 2.78 cm2
Height: 64 in
S' Lateral: 3.17 cm
Weight: 3684.8 oz

## 2020-09-21 LAB — BASIC METABOLIC PANEL
Anion gap: 9 (ref 5–15)
BUN: 26 mg/dL — ABNORMAL HIGH (ref 8–23)
CO2: 21 mmol/L — ABNORMAL LOW (ref 22–32)
Calcium: 8.7 mg/dL — ABNORMAL LOW (ref 8.9–10.3)
Chloride: 105 mmol/L (ref 98–111)
Creatinine, Ser: 1.14 mg/dL — ABNORMAL HIGH (ref 0.44–1.00)
GFR, Estimated: 51 mL/min — ABNORMAL LOW (ref >60)
Glucose, Bld: 169 mg/dL — ABNORMAL HIGH (ref 70–99)
Potassium: 3.6 mmol/L (ref 3.5–5.1)
Sodium: 135 mmol/L (ref 135–145)

## 2020-09-21 LAB — HEMOGLOBIN A1C
Hgb A1c MFr Bld: 7.1 % — ABNORMAL HIGH (ref 4.8–5.6)
Mean Plasma Glucose: 157.07 mg/dL

## 2020-09-21 LAB — TROPONIN I (HIGH SENSITIVITY): Troponin I (High Sensitivity): 82 ng/L — ABNORMAL HIGH (ref <18)

## 2020-09-21 MED ORDER — CALCIUM CARBONATE ANTACID 500 MG PO CHEW
1.0000 | CHEWABLE_TABLET | Freq: Every day | ORAL | Status: DC
  Administered 2020-09-21: 200 mg via ORAL
  Filled 2020-09-21: qty 1

## 2020-09-21 MED ORDER — LOPERAMIDE HCL 2 MG PO CAPS: 2.0000 mg | ORAL_CAPSULE | ORAL | 0 refills | Status: DC | PRN

## 2020-09-21 MED ORDER — LOSARTAN POTASSIUM 25 MG PO TABS: 25.0000 mg | ORAL_TABLET | Freq: Every day | ORAL | 0 refills | Status: DC

## 2020-09-21 MED ORDER — LOPERAMIDE HCL 2 MG PO CAPS
2.0000 mg | ORAL_CAPSULE | ORAL | Status: DC | PRN
  Administered 2020-09-21: 2 mg via ORAL
  Filled 2020-09-21: qty 1

## 2020-09-21 NOTE — Discharge Summary (Addendum)
Physician Discharge Summary  Stephanie Small YHC:[id number] DOB: 1945/11/07 DOA: 09/20/2020  PCP: Lyndon Code, MD  Admit date: 09/20/2020 Discharge date: 09/21/2020  Admitted From: Home Disposition:  Home  Recommendations for Outpatient Follow-up:  1. Follow up with PCP in 1-2 weeks 2. Please obtain BMP/CBC in one week 3. Please follow up on the following pending results: Echocardiogram results.  Home Health: Yes Equipment/Devices: None Discharge Condition: Stable CODE STATUS: Full Diet recommendation: Heart Healthy / Carb Modified   Brief/Interim Summary: Stephanie Small is a 74 y.o. female with medical history significant for chronic pain syndrome, hypertension, long-term use of opioid medication for chronic neck and low back pain, right knee arthritis, hyperlipidemia, remote history of CAD status post PCI over 10 years ago, non-insulin-dependent diabetes mellitus, presents to the emergency department for chief concerns of dizziness.  She further reports that she has been having diarrhea persistently since Friday, September 14, 2020.  She states the bowel movements have been watery and yellow.  She denies any diarrhea on day of admission.  GI pathogen and C. difficile studies were ordered by admitting provider but she never had any bowel movement during her stay.  She was hypotensive on admission which improved with IV fluid, most likely secondary to GI losses.  She mildly positive troponin most likely secondary to demand ischemia. Echocardiogram   She also had mild AKI secondary to GI losses which resolved with IV fluid.  She was feeling much better next day and ready to go home.  We will continue her home medications and follow-up with her providers.  Discharge Diagnoses:  Principal Problem:   Chest pain Active Problems:   Benign essential tremor   DDD (degenerative disc disease), cervical   DDD (degenerative disc disease), lumbar   Essential hypertension    Gastroesophageal reflux disease with esophagitis   Myofascial muscle pain   Polypharmacy   Elevated troponin   Discharge Instructions  Discharge Instructions    Diet - low sodium heart healthy   Complete by: As directed    Discharge instructions   Complete by: As directed    It was pleasure taking care of you. I am decreasing the dose of losartan as your blood pressure was within normal limit without it, please discuss with your primary care provider and they can uptitrate as needed. Keep yourself well-hydrated. You can use over-the-counter Imodium as needed for diarrhea.   Increase activity slowly   Complete by: As directed      Allergies as of 09/21/2020      Reactions   Shellfish Allergy Rash      Medication List    STOP taking these medications   levofloxacin 750 MG tablet Commonly known as: Levaquin   predniSONE 10 MG tablet Commonly known as: DELTASONE     TAKE these medications   albuterol 108 (90 Base) MCG/ACT inhaler Commonly known as: VENTOLIN HFA Inhale into the lungs every 6 (six) hours as needed for wheezing or shortness of breath.   aspirin 81 MG tablet Take 81 mg by mouth daily.   Breo Ellipta 100-25 MCG/INH Aepb Generic drug: fluticasone furoate-vilanterol Inhale 1 puff into the lungs daily.   citalopram 20 MG tablet Commonly known as: CELEXA Take 20 mg by mouth daily.   clopidogrel 75 MG tablet Commonly known as: PLAVIX Take 75 mg by mouth daily.   diclofenac sodium 1 % Gel Commonly known as: VOLTAREN Apply topically as needed.   gabapentin 400 MG capsule Commonly known as: NEURONTIN  Take 400 mg by mouth daily.   isosorbide mononitrate 30 MG 24 hr tablet Commonly known as: IMDUR Take 30 mg by mouth daily.   loperamide 2 MG capsule Commonly known as: IMODIUM Take 1 capsule (2 mg total) by mouth as needed for diarrhea or loose stools.   losartan 25 MG tablet Commonly known as: COZAAR Take 1 tablet (25 mg total) by mouth  daily. What changed:   medication strength  how much to take   methocarbamol 500 MG tablet Commonly known as: ROBAXIN Take 500 mg by mouth 3 (three) times daily.   oxyCODONE 60 MG 12 hr tablet Commonly known as: OXYCONTIN Take by mouth 3 (three) times daily.   pantoprazole 40 MG tablet Commonly known as: PROTONIX Take 40 mg by mouth daily.   polyethylene glycol 236 g solution Commonly known as: Golytely Drink one 8 oz glass every 20 mins until stools are clear   pravastatin 40 MG tablet Commonly known as: PRAVACHOL Take 40 mg by mouth daily.   saxagliptin HCl 2.5 MG Tabs tablet Commonly known as: ONGLYZA Take 2.5 mg by mouth daily.   SUMAtriptan 100 MG tablet Commonly known as: IMITREX Take 100 mg by mouth every 2 (two) hours as needed for migraine. May repeat in 2 hours if headache persists or recurs.   vitamin C 500 MG tablet Commonly known as: ASCORBIC ACID Take 500 mg by mouth daily.       Follow-up Information    Laurier Nancy, MD. Schedule an appointment as soon as possible for a visit on 09/24/2020.   Specialty: Cardiology Why: @ 3pm Contact information: [address] [place] [address] [phone]        Schedule an appointment as soon as possible for a visit with Lyndon Code, MD.   Specialty: Internal Medicine Why: Patient will need to make a follow up appointment. Contact information: [address] [place] [address] [phone]              Allergies  Allergen Reactions  . Shellfish Allergy Rash    Consultations:  None  Procedures/Studies: CT Head Wo Contrast  Result Date: 09/20/2020 CLINICAL DATA:  75 year old female with worsening headache. EXAM: CT HEAD WITHOUT CONTRAST TECHNIQUE: Contiguous axial images were obtained from the base of the skull through the vertex without intravenous contrast. COMPARISON:  Head CT dated 10/21/2014. FINDINGS: Brain: The ventricles and sulci appropriate size for patient's age. The  gray-white matter discrimination is preserved. There is no acute intracranial hemorrhage. No mass effect or midline shift. No extra-axial fluid collection. Vascular: No hyperdense vessel or unexpected calcification. Skull: Normal. Negative for fracture or focal lesion. Sinuses/Orbits: No acute finding. Other: None IMPRESSION: Unremarkable noncontrast CT of the brain. Electronically Signed   By: Elgie Collard M.D.   On: 09/20/2020 20:06   DG Chest Portable 1 View  Result Date: 09/20/2020 CLINICAL DATA:  Chest pain EXAM: PORTABLE CHEST 1 VIEW COMPARISON:  April 27, 2017 chest radiograph and chest CT FINDINGS: There is chronic interstitial thickening without edema or airspace opacity. Heart is upper normal in size with pulmonary vascularity normal. There is aortic atherosclerosis. There is arthropathy in each shoulder. There is slight leftward deviation of the upper thoracic trachea, likely due to known thyroid enlargement IMPRESSION: Chronic interstitial thickening, likely due to chronic bronchitis. No edema or airspace opacity. Stable cardiac silhouette. Mild deviation of the upper thoracic trachea to the left, likely due to known thyroid prominence. Aortic Atherosclerosis (ICD10-I70.0). Electronically Signed  By: Bretta Bang III M.D.   On: 09/20/2020 19:59    Subjective: Patient was seen and examined today.  Denies any chest pain or dizziness.  Stating that she is feeling much better and ready to go home.  Discharge Exam: Vitals:   09/21/20 0752 09/21/20 1129  BP: 103/90 112/63  Pulse: 65 71  Resp: 16 18  Temp: 98.5 F (36.9 C) 97.8 F (36.6 C)  SpO2: 95% 91%   Vitals:   09/21/20 0352 09/21/20 0752 09/21/20 1051 09/21/20 1129  BP: 116/66 103/90  112/63  Pulse: 67 65  71  Resp: 16 16  18   Temp: 97.8 F (36.6 C) 98.5 F (36.9 C)  97.8 F (36.6 C)  TempSrc: Oral Oral  Oral  SpO2: 92% 95%  91%  Weight:   104.5 kg   Height:        General: Pt is alert, awake, not in acute  distress Cardiovascular: RRR, S1/S2 +, no rubs, no gallops Respiratory: CTA bilaterally, no wheezing, no rhonchi Abdominal: Soft, NT, ND, bowel sounds + Extremities: no edema, no cyanosis   The results of significant diagnostics from this hospitalization (including imaging, microbiology, ancillary and laboratory) are listed below for reference.    Microbiology: Recent Results (from the past 240 hour(s))  Resp Panel by RT-PCR (Flu A&B, Covid)     Status: None   Collection Time: 09/20/20  8:44 PM   Specimen: Nasopharyngeal(NP) swabs in vial transport medium  Result Value Ref Range Status   SARS Coronavirus 2 by RT PCR NEGATIVE NEGATIVE Final    Comment: (NOTE) SARS-CoV-2 target nucleic acids are NOT DETECTED.  The SARS-CoV-2 RNA is generally detectable in upper respiratory specimens during the acute phase of infection. The lowest concentration of SARS-CoV-2 viral copies this assay can detect is 138 copies/mL. A negative result does not preclude SARS-Cov-2 infection and should not be used as the sole basis for treatment or other patient management decisions. A negative result may occur with  improper specimen collection/handling, submission of specimen other than nasopharyngeal swab, presence of viral mutation(s) within the areas targeted by this assay, and inadequate number of viral copies(<138 copies/mL). A negative result must be combined with clinical observations, patient history, and epidemiological information. The expected result is Negative.  Fact Sheet for Patients:  09/22/20  Fact Sheet for Healthcare Providers:  BloggerCourse.com  This test is no t yet approved or cleared by the [place] FDA and  has been authorized for detection and/or diagnosis of SARS-CoV-2 by FDA under an Emergency Use Authorization (EUA). This EUA will remain  in effect (meaning this test can be used) for the duration of the COVID-19  declaration under Section 564(b)(1) of the Act, 21 U.S.C.section 360bbb-3(b)(1), unless the authorization is terminated  or revoked sooner.       Influenza A by PCR NEGATIVE NEGATIVE Final   Influenza B by PCR NEGATIVE NEGATIVE Final    Comment: (NOTE) The Xpert Xpress SARS-CoV-2/FLU/RSV plus assay is intended as an aid in the diagnosis of influenza from Nasopharyngeal swab specimens and should not be used as a sole basis for treatment. Nasal washings and aspirates are unacceptable for Xpert Xpress SARS-CoV-2/FLU/RSV testing.  Fact Sheet for Patients: [place]  Fact Sheet for Healthcare Providers: BloggerCourse.com  This test is not yet approved or cleared by the [place] FDA and has been authorized for detection and/or diagnosis of SARS-CoV-2 by FDA under an Emergency Use Authorization (EUA). This EUA will remain in effect (meaning this test  can be used) for the duration of the COVID-19 declaration under Section 564(b)(1) of the Act, 21 U.S.C. section 360bbb-3(b)(1), unless the authorization is terminated or revoked.  Performed at [facility], [address] [place], [place] [id number]      Labs: BNP (last 3 results) No results for input(s): BNP in the last 8760 hours. Basic Metabolic Panel: Recent Labs  Lab 09/20/20 1933 09/21/20 0004  NA 137 135  K 3.4* 3.6  CL 105 105  CO2 23 21*  GLUCOSE 184* 169*  BUN 29* 26*  CREATININE 1.36* 1.14*  CALCIUM 9.5 8.7*   Liver Function Tests: Recent Labs  Lab 09/20/20 1933  AST 20  ALT 20  ALKPHOS 93  BILITOT 0.6  PROT 7.0  ALBUMIN 4.1   No results for input(s): LIPASE, AMYLASE in the last 168 hours. No results for input(s): AMMONIA in the last 168 hours. CBC: Recent Labs  Lab 09/20/20 1933 09/21/20 0004  WBC 9.6 8.2  HGB 14.1 13.1  HCT 42.4 39.5  MCV 95.9 96.8  PLT 268 228   Cardiac Enzymes: No results for input(s): CKTOTAL, CKMB,  CKMBINDEX, TROPONINI in the last 168 hours. BNP: Invalid input(s): POCBNP CBG: Recent Labs  Lab 09/21/20 0751 09/21/20 1129  GLUCAP 107* 168*   D-Dimer No results for input(s): DDIMER in the last 72 hours. Hgb A1c Recent Labs    09/21/20 0004  HGBA1C 7.1*   Lipid Profile No results for input(s): CHOL, HDL, LDLCALC, TRIG, CHOLHDL, LDLDIRECT in the last 72 hours. Thyroid function studies No results for input(s): TSH, T4TOTAL, T3FREE, THYROIDAB in the last 72 hours.  Invalid input(s): FREET3 Anemia work up No results for input(s): VITAMINB12, FOLATE, FERRITIN, TIBC, IRON, RETICCTPCT in the last 72 hours. Urinalysis    Component Value Date/Time   COLORURINE AMBER (A) 09/20/2020 2044   APPEARANCEUR CLOUDY (A) 09/20/2020 2044   LABSPEC 1.018 09/20/2020 2044   PHURINE 5.0 09/20/2020 2044   GLUCOSEU NEGATIVE 09/20/2020 2044   HGBUR NEGATIVE 09/20/2020 2044   BILIRUBINUR NEGATIVE 09/20/2020 2044   KETONESUR NEGATIVE 09/20/2020 2044   PROTEINUR NEGATIVE 09/20/2020 2044   NITRITE NEGATIVE 09/20/2020 2044   LEUKOCYTESUR LARGE (A) 09/20/2020 2044   Sepsis Labs Invalid input(s): PROCALCITONIN,  WBC,  LACTICIDVEN Microbiology Recent Results (from the past 240 hour(s))  Resp Panel by RT-PCR (Flu A&B, Covid)     Status: None   Collection Time: 09/20/20  8:44 PM   Specimen: Nasopharyngeal(NP) swabs in vial transport medium  Result Value Ref Range Status   SARS Coronavirus 2 by RT PCR NEGATIVE NEGATIVE Final    Comment: (NOTE) SARS-CoV-2 target nucleic acids are NOT DETECTED.  The SARS-CoV-2 RNA is generally detectable in upper respiratory specimens during the acute phase of infection. The lowest concentration of SARS-CoV-2 viral copies this assay can detect is 138 copies/mL. A negative result does not preclude SARS-Cov-2 infection and should not be used as the sole basis for treatment or other patient management decisions. A negative result may occur with  improper specimen  collection/handling, submission of specimen other than nasopharyngeal swab, presence of viral mutation(s) within the areas targeted by this assay, and inadequate number of viral copies(<138 copies/mL). A negative result must be combined with clinical observations, patient history, and epidemiological information. The expected result is Negative.  Fact Sheet for Patients:  BloggerCourse.comhttps://www.fda.gov/media/152166/download  Fact Sheet for Healthcare Providers:  SeriousBroker.ithttps://www.fda.gov/media/152162/download  This test is no t yet approved or cleared by the [place] FDA and  has been  authorized for detection and/or diagnosis of SARS-CoV-2 by FDA under an Emergency Use Authorization (EUA). This EUA will remain  in effect (meaning this test can be used) for the duration of the COVID-19 declaration under Section 564(b)(1) of the Act, 21 U.S.C.section 360bbb-3(b)(1), unless the authorization is terminated  or revoked sooner.       Influenza A by PCR NEGATIVE NEGATIVE Final   Influenza B by PCR NEGATIVE NEGATIVE Final    Comment: (NOTE) The Xpert Xpress SARS-CoV-2/FLU/RSV plus assay is intended as an aid in the diagnosis of influenza from Nasopharyngeal swab specimens and should not be used as a sole basis for treatment. Nasal washings and aspirates are unacceptable for Xpert Xpress SARS-CoV-2/FLU/RSV testing.  Fact Sheet for Patients: BloggerCourse.com  Fact Sheet for Healthcare Providers: SeriousBroker.it  This test is not yet approved or cleared by the [place] FDA and has been authorized for detection and/or diagnosis of SARS-CoV-2 by FDA under an Emergency Use Authorization (EUA). This EUA will remain in effect (meaning this test can be used) for the duration of the COVID-19 declaration under Section 564(b)(1) of the Act, 21 U.S.C. section 360bbb-3(b)(1), unless the authorization is terminated or revoked.  Performed at [facility], [address] [place], [place] [address]     Time coordinating discharge: Over 30 minutes  SIGNED:  Arnetha Courser, MD  [facility] 09/21/2020, 3:16 PM  If 7PM-7AM, please contact night-coverage [email or web address]  This record has been created using Conservation officer, historic buildings. Errors have been sought and corrected,but may not always be located. Such creation errors do not reflect on the standard of care.

## 2020-09-21 NOTE — Progress Notes (Signed)
*  PRELIMINARY RESULTS* Echocardiogram 2D Echocardiogram has been performed.  Cristela Blue 09/21/2020, 1:20 PM

## 2020-09-21 NOTE — Progress Notes (Signed)
*  PRELIMINARY RESULTS* Echocardiogram 2D Echocardiogram has been performed.  Stephanie Small 09/21/2020, 1:20 PM 

## 2020-09-21 NOTE — TOC Transition Note (Signed)
Transition of Care [facility]) - CM/SW Discharge Note   Patient Details  Name: RHIANON ZABAWA MRN: [id number] Date of Birth: [birth year]  Transition of Care [facility]) CM/SW Contact:  Hetty Ely, RN Phone Number: 09/21/2020, 4:37 PM   Clinical Narrative:  Patient discharged today, daughter at bedside. Discussed [facility] PT/OT service recommendations. Both agrees, daughter lives in the home, prefers the Agency to call her [phone] because patient will not answer phone if she does not recognize number. [facility] called and HH PT/OT arranged, daughter contact information given. Blood pressure cuff also given to patient. TOC barriers resolved.           Patient Goals and CMS Choice        Discharge Placement                       Discharge Plan and Services                                     Social Determinants of Health (SDOH) Interventions     Readmission Risk Interventions No flowsheet data found.

## 2020-09-21 NOTE — Evaluation (Signed)
Physical Therapy Evaluation Patient Details Name: Stephanie Small MRN: [id number] DOB: 01-06-1946 Today's Date: 09/21/2020   History of Present Illness  is a 75 y.o. female who presents ED for palpitations and headache.  According to the patient  she has been experiencing diarrhea for the past 2 days although states none today, states around 2 PM or so today she developed a headache felt dizzy and felt like her heart was racing in her chest.  Per EMS family states there is a mild cognitive impairment which is baseline for the patient.  Patient denies any nausea or vomiting.  No known fever cough congestion or shortness of breath.  Denies any chest pain or current palpitations.  Patient was complaining of some mild epigastric pain but has since resolved as well. PMH: chronic pain syndrome, HTN, long term use of opioid meds for chronic neck & LBP, R knee arthritis, HLD, remote hx of CAD s/p PCI over 10 years ago, NIDDM, COPD, glaucoma, thyroid cyst  Clinical Impression  Pt seen for PT evaluation with daughter present throughout. BP checked in LUE and no orthostatic hypotension noted; educated pt & daughter on need for pt to consume water to help replenish fluids lost 2/2 diarrhea & impact of dehydration on BP. Pt is able to ambulate without AD with CGA with antalgic gait pattern 2/2 hx of chronic R knee pain & decreased balance during turning. Provided pt with RW & pt able to ambulate increased distances with close supervision with ongoing antalgic gait pattern but less LOB noted with pt reporting RW provides more support. Encouraged pt to use RW to help prevent LOB & alleviate R knee pain, educated them to remove throw rugs/tripping hazards in home, & discussed f/u HHPT to address balance, endurance, and R knee pain management.   BP in LUE: Sitting 130/119 mmHg (MAP 124) HR 123 bpm  Rechecked in sitting BP 117/73 mmHg (MAP 83), HR 62 bpm Standing BP 118/65 mmHg (MAP 75), HR 71 bpm HR Up to 94 bpm  after gait at end of session    Follow Up Recommendations Home health PT;Supervision for mobility/OOB    Equipment Recommendations  None recommended by PT (pt already has RW)    Recommendations for Other Services       Precautions / Restrictions Precautions Precautions: Fall Restrictions Weight Bearing Restrictions: No      Mobility  Bed Mobility Overal bed mobility: Modified Independent                  Transfers Overall transfer level: Needs assistance Equipment used: None Transfers: Sit to/from Stand Sit to Stand: Min assist         General transfer comment: min assist without AD  Ambulation/Gait Ambulation/Gait assistance: Min guard;Supervision Gait Distance (Feet): 35 Feet (+ 200 ft) Assistive device: None;Rolling walker (2 wheeled) Gait Pattern/deviations: Decreased stride length;Decreased dorsiflexion - right;Decreased step length - left;Decreased step length - right Gait velocity: decreased   General Gait Details: increased weight shift to L, increased lateral instability when turning, CGA when ambulating without AD, close supervision when ambulating with RW  Stairs            Wheelchair Mobility    Modified Rankin (Stroke Patients Only)       Balance Overall balance assessment: Needs assistance Sitting-balance support: No upper extremity supported;Feet supported Sitting balance-Leahy Scale: Good     Standing balance support: No upper extremity supported;During functional activity Standing balance-Leahy Scale: Fair  Pertinent Vitals/Pain Pain Assessment: 0-10 Pain Score: 6  Pain Location: R knee after gait Pain Descriptors / Indicators: Aching Pain Intervention(s): Limited activity within patient's tolerance;Monitored during session;Repositioned (notified nurse of pt's c/o pain)    Home Living Family/patient expects to be discharged to:: Private residence Living Arrangements: Children  (daughter & son in law) Available Help at Discharge: Family;Available 24 hours/day Type of Home: House Home Access: Stairs to enter Entrance Stairs-Rails:  (hand supports on B sides) Entrance Stairs-Number of Steps: 1 Home Layout: One level Home Equipment: Walker - 2 wheels;Cane - single point      Prior Function Level of Independence: Independent with assistive device(s)         Comments: denies falls in the past 6 months     Hand Dominance        Extremity/Trunk Assessment   Upper Extremity Assessment Upper Extremity Assessment: Overall WFL for tasks assessed    Lower Extremity Assessment Lower Extremity Assessment: Generalized weakness (chronic R Knee pain)       Communication      Cognition Arousal/Alertness: Awake/alert Behavior During Therapy: WFL for tasks assessed/performed Overall Cognitive Status: History of cognitive impairments - at baseline (baseline cognitive deficits but pt oriented to location, situation, self)                                 General Comments: pleasant & agreeable to tx      General Comments      Exercises     Assessment/Plan    PT Assessment Patient needs continued PT services  PT Problem List Decreased strength;Decreased mobility;Decreased activity tolerance;Pain;Decreased balance;Decreased knowledge of use of DME       PT Treatment Interventions DME instruction;Therapeutic activities;Modalities;Gait training;Therapeutic exercise;Patient/family education;Stair training;Balance training;Neuromuscular re-education;Functional mobility training;Manual techniques    PT Goals (Current goals can be found in the Care Plan section)  Acute Rehab PT Goals Patient Stated Goal: go home PT Goal Formulation: With patient/family Time For Goal Achievement: 10/05/20 Potential to Achieve Goals: Good    Frequency Min 2X/week   Barriers to discharge        Co-evaluation               AM-PAC PT "6 Clicks"  Mobility  Outcome Measure Help needed turning from your back to your side while in a flat bed without using bedrails?: None Help needed moving from lying on your back to sitting on the side of a flat bed without using bedrails?: None Help needed moving to and from a bed to a chair (including a wheelchair)?: None Help needed standing up from a chair using your arms (e.g., wheelchair or bedside chair)?: A Little Help needed to walk in hospital room?: A Little Help needed climbing 3-5 steps with a railing? : A Little 6 Click Score: 21    End of Session Equipment Utilized During Treatment: Gait belt Activity Tolerance: Patient limited by pain Patient left: in bed;with call bell/phone within reach;with family/visitor present Nurse Communication: Mobility status PT Visit Diagnosis: Difficulty in walking, not elsewhere classified (R26.2);Muscle weakness (generalized) (M62.81);Pain;Unsteadiness on feet (R26.81) Pain - Right/Left: Right Pain - part of body: Knee    Time: 1217-1236 PT Time Calculation (min) (ACUTE ONLY): 19 min   Charges:   PT Evaluation $PT Eval Low Complexity: 1 Low PT Treatments $Therapeutic Activity: 8-22 mins        Aleda Grana, PT, DPT 09/21/20, 12:47 PM  Sandi Mariscal 09/21/2020, 12:44 PM

## 2020-09-22 LAB — URINE CULTURE: Culture: >=100000 — AB

## 2021-01-08 ENCOUNTER — Inpatient Hospital Stay
Admission: EM | Admit: 2021-01-08 | Discharge: 2021-01-11 | DRG: 871 | Disposition: A | Discharge status: Home / Self Care | Payer: Medicare Other | Attending: Internal Medicine | Admitting: Internal Medicine

## 2021-01-08 ENCOUNTER — Other Ambulatory Visit: Payer: Self-pay

## 2021-01-08 ENCOUNTER — Emergency Department: Payer: Medicare Other

## 2021-01-08 DIAGNOSIS — A401 Sepsis due to streptococcus, group B: Principal | ICD-10-CM | POA: Diagnosis present

## 2021-01-08 DIAGNOSIS — Z79891 Long term (current) use of opiate analgesic: Secondary | ICD-10-CM

## 2021-01-08 DIAGNOSIS — Z66 Do not resuscitate: Secondary | ICD-10-CM | POA: Diagnosis present

## 2021-01-08 DIAGNOSIS — Z7902 Long term (current) use of antithrombotics/antiplatelets: Secondary | ICD-10-CM

## 2021-01-08 DIAGNOSIS — I959 Hypotension, unspecified: Secondary | ICD-10-CM | POA: Diagnosis present

## 2021-01-08 DIAGNOSIS — N179 Acute kidney failure, unspecified: Secondary | ICD-10-CM

## 2021-01-08 DIAGNOSIS — R42 Dizziness and giddiness: Secondary | ICD-10-CM

## 2021-01-08 DIAGNOSIS — F17210 Nicotine dependence, cigarettes, uncomplicated: Secondary | ICD-10-CM | POA: Diagnosis present

## 2021-01-08 DIAGNOSIS — E785 Hyperlipidemia, unspecified: Secondary | ICD-10-CM | POA: Diagnosis present

## 2021-01-08 DIAGNOSIS — N170 Acute kidney failure with tubular necrosis: Secondary | ICD-10-CM | POA: Diagnosis present

## 2021-01-08 DIAGNOSIS — Z6837 Body mass index (BMI) 37.0-37.9, adult: Secondary | ICD-10-CM

## 2021-01-08 DIAGNOSIS — N183 Chronic kidney disease, stage 3 unspecified: Secondary | ICD-10-CM | POA: Diagnosis present

## 2021-01-08 DIAGNOSIS — N390 Urinary tract infection, site not specified: Secondary | ICD-10-CM | POA: Diagnosis present

## 2021-01-08 DIAGNOSIS — Z91013 Allergy to seafood: Secondary | ICD-10-CM

## 2021-01-08 DIAGNOSIS — G250 Essential tremor: Secondary | ICD-10-CM | POA: Diagnosis present

## 2021-01-08 DIAGNOSIS — Z7982 Long term (current) use of aspirin: Secondary | ICD-10-CM

## 2021-01-08 DIAGNOSIS — J449 Chronic obstructive pulmonary disease, unspecified: Secondary | ICD-10-CM | POA: Diagnosis present

## 2021-01-08 DIAGNOSIS — R7989 Other specified abnormal findings of blood chemistry: Secondary | ICD-10-CM | POA: Diagnosis present

## 2021-01-08 DIAGNOSIS — A419 Sepsis, unspecified organism: Secondary | ICD-10-CM | POA: Diagnosis present

## 2021-01-08 DIAGNOSIS — E1129 Type 2 diabetes mellitus with other diabetic kidney complication: Secondary | ICD-10-CM

## 2021-01-08 DIAGNOSIS — R778 Other specified abnormalities of plasma proteins: Secondary | ICD-10-CM | POA: Diagnosis present

## 2021-01-08 DIAGNOSIS — Z7984 Long term (current) use of oral hypoglycemic drugs: Secondary | ICD-10-CM

## 2021-01-08 DIAGNOSIS — N1831 Chronic kidney disease, stage 3a: Secondary | ICD-10-CM | POA: Diagnosis present

## 2021-01-08 DIAGNOSIS — K529 Noninfective gastroenteritis and colitis, unspecified: Secondary | ICD-10-CM

## 2021-01-08 DIAGNOSIS — I10 Essential (primary) hypertension: Secondary | ICD-10-CM | POA: Diagnosis present

## 2021-01-08 DIAGNOSIS — E1122 Type 2 diabetes mellitus with diabetic chronic kidney disease: Secondary | ICD-10-CM | POA: Diagnosis present

## 2021-01-08 DIAGNOSIS — G894 Chronic pain syndrome: Secondary | ICD-10-CM | POA: Diagnosis present

## 2021-01-08 DIAGNOSIS — K219 Gastro-esophageal reflux disease without esophagitis: Secondary | ICD-10-CM | POA: Diagnosis present

## 2021-01-08 DIAGNOSIS — Z79899 Other long term (current) drug therapy: Secondary | ICD-10-CM

## 2021-01-08 DIAGNOSIS — I248 Other forms of acute ischemic heart disease: Secondary | ICD-10-CM | POA: Diagnosis present

## 2021-01-08 DIAGNOSIS — F32A Depression, unspecified: Secondary | ICD-10-CM | POA: Diagnosis present

## 2021-01-08 DIAGNOSIS — M1990 Unspecified osteoarthritis, unspecified site: Secondary | ICD-10-CM | POA: Diagnosis present

## 2021-01-08 DIAGNOSIS — Z9049 Acquired absence of other specified parts of digestive tract: Secondary | ICD-10-CM

## 2021-01-08 DIAGNOSIS — I129 Hypertensive chronic kidney disease with stage 1 through stage 4 chronic kidney disease, or unspecified chronic kidney disease: Secondary | ICD-10-CM | POA: Diagnosis present

## 2021-01-08 DIAGNOSIS — I2510 Atherosclerotic heart disease of native coronary artery without angina pectoris: Secondary | ICD-10-CM | POA: Diagnosis present

## 2021-01-08 DIAGNOSIS — E669 Obesity, unspecified: Secondary | ICD-10-CM | POA: Diagnosis present

## 2021-01-08 DIAGNOSIS — R600 Localized edema: Secondary | ICD-10-CM

## 2021-01-08 DIAGNOSIS — Z7983 Long term (current) use of bisphosphonates: Secondary | ICD-10-CM

## 2021-01-08 DIAGNOSIS — F17290 Nicotine dependence, other tobacco product, uncomplicated: Secondary | ICD-10-CM | POA: Diagnosis present

## 2021-01-08 DIAGNOSIS — I4891 Unspecified atrial fibrillation: Secondary | ICD-10-CM

## 2021-01-08 DIAGNOSIS — J441 Chronic obstructive pulmonary disease with (acute) exacerbation: Secondary | ICD-10-CM

## 2021-01-08 DIAGNOSIS — Z8249 Family history of ischemic heart disease and other diseases of the circulatory system: Secondary | ICD-10-CM

## 2021-01-08 DIAGNOSIS — E119 Type 2 diabetes mellitus without complications: Secondary | ICD-10-CM | POA: Diagnosis present

## 2021-01-08 DIAGNOSIS — Z7951 Long term (current) use of inhaled steroids: Secondary | ICD-10-CM

## 2021-01-08 DIAGNOSIS — Z20822 Contact with and (suspected) exposure to covid-19: Secondary | ICD-10-CM | POA: Diagnosis present

## 2021-01-08 DIAGNOSIS — R197 Diarrhea, unspecified: Secondary | ICD-10-CM | POA: Diagnosis present

## 2021-01-08 MED ORDER — IPRATROPIUM-ALBUTEROL 0.5-2.5 (3) MG/3ML IN SOLN
3.0000 mL | Freq: Once | RESPIRATORY_TRACT | Status: AC
  Administered 2021-01-09: 3 mL via RESPIRATORY_TRACT
  Filled 2021-01-08: qty 3

## 2021-01-08 MED ORDER — METHYLPREDNISOLONE SODIUM SUCC 125 MG IJ SOLR
125.0000 mg | Freq: Once | INTRAMUSCULAR | Status: AC
  Administered 2021-01-09: 125 mg via INTRAVENOUS
  Filled 2021-01-08: qty 2

## 2021-01-08 MED ORDER — DILTIAZEM HCL 25 MG/5ML IV SOLN
INTRAVENOUS | Status: AC
  Filled 2021-01-08: qty 5

## 2021-01-08 MED ORDER — LACTATED RINGERS IV BOLUS
1000.0000 mL | Freq: Once | INTRAVENOUS | Status: AC
  Administered 2021-01-08: 1000 mL via INTRAVENOUS

## 2021-01-08 NOTE — ED Notes (Signed)
ED Provider at bedside. 

## 2021-01-08 NOTE — ED Notes (Signed)
xr at bedside

## 2021-01-08 NOTE — ED Notes (Signed)
Hr 170's, afib rvr on ekg

## 2021-01-08 NOTE — ED Triage Notes (Signed)
Pt says that she was getting ready to go to bed and had onset of dizziness. She says her daughter took her BP and it was "low" but she does not know what the numbers were. Has a headache. Denies numbness/tingling, reports speech is normal for her, symmetrical smile, equal grips.

## 2021-01-08 NOTE — ED Provider Notes (Signed)
[facility] Emergency Department Provider Note ____________________________________________   Event Date/Time   First MD Initiated Contact with Patient 01/08/21 2331     (approximate)  I have reviewed the triage vital signs and the nursing notes.  HISTORY  Chief Complaint Dizziness   HPI Stephanie Small is a 75 y.o. femalewho presents to the ED for evaluation of dizziness, diarrhea  Chart review indicates history of CAD with remote stenting.  HTN, chronic pain syndrome on OxyContin 60 mg 3 times daily, COPD  Patient presents to the ED via POV for evaluation of a few hours of lightheaded presyncopal dizziness since about 6 PM this afternoon.  She reports about 5 days of watery diarrhea, with about 5 episodes of loose stools per day over the past few days.  Denies hematochezia, melena, emesis.  She reports some postprandial abdominal discomfort this evening, and she is s/p cholecystectomy.  She denies any fever, chest pressure, shortness of breath, syncopal episodes.  Denies known history of atrial fibrillation.   When daughter later arrives, she tells me that patient has had more than a month of diarrhea and this is more of a chronic issue for her.  Daughter reports that patient pulled her aside this evening was complaining of lightheaded presyncopal dizziness, so daughter checked her blood pressure, noting a BP of 90/40, due to this they present to the ED for evaluation.  Past Medical History:  Diagnosis Date   Arthritis    COPD (chronic obstructive pulmonary disease) (HCC)    Diabetes mellitus without complication (HCC)    Glaucoma 2010   Hypertension 2005   Personal history of colonic polyps    Thyroid cyst     Patient Active Problem List   Diagnosis Date Noted   Elevated troponin    Chest pain 09/20/2020   Dilated cbd, acquired 03/18/2017   Pain medication agreement signed 03/05/2017   Atypical squamous cells of undetermined significance  (ASCUS) on Papanicolaou smear of cervix 10/08/2016   History of adenomatous polyp of colon    Benign essential tremor 12/13/2015   Essential hypertension 10/25/2015   Goiter 10/25/2015   Respiratory distress 10/25/2015   Thyroid cyst 10/25/2015   Type 2 diabetes mellitus without complication (HCC) 10/25/2015   Wheezing 10/25/2015   COPD with hypoxia (HCC) 10/23/2015   Gastroesophageal reflux disease with esophagitis 01/18/2015   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   Myofascial muscle pain 12/14/2014   DDD (degenerative disc disease), cervical 07/31/2014   DDD (degenerative disc disease), lumbar 07/31/2014   Polypharmacy 05/01/2014   Neck pain 10/07/2012   Other chronic pain 10/07/2012   Degenerative disk disease 10/07/2012    Past Surgical History:  Procedure Laterality Date   CAROTID STENT INSERTION  2011   CHOLECYSTECTOMY  2009   COLONOSCOPY  2007   COLONOSCOPY WITH PROPOFOL N/A 08/18/2016   Procedure: COLONOSCOPY WITH PROPOFOL;  Surgeon: Midge Minium, MD;  Location: [facility];  Service: Endoscopy;  Laterality: N/A;   FOOT SURGERY     SHOULDER SURGERY Bilateral 2004   UPPER GI ENDOSCOPY      Prior to Admission medications   Medication Sig Start Date End Date Taking? Authorizing Provider  albuterol (PROVENTIL HFA;VENTOLIN HFA) 108 (90 Base) MCG/ACT inhaler Inhale into the lungs every 6 (six) hours as needed for wheezing or shortness of breath.    [provider]  aspirin 81 MG tablet Take 81 mg by mouth daily.    [provider]  citalopram (CELEXA) 20 MG tablet Take  20 mg by mouth daily.    [provider]  clopidogrel (PLAVIX) 75 MG tablet Take 75 mg by mouth daily.    [provider]  diclofenac sodium (VOLTAREN) 1 % GEL Apply topically as needed.    [provider]  fluticasone furoate-vilanterol (BREO ELLIPTA) 100-25 MCG/INH AEPB Inhale 1 puff into the lungs daily.    [provider]  gabapentin (NEURONTIN) 400 MG  capsule Take 400 mg by mouth daily.    [provider]  isosorbide mononitrate (IMDUR) 30 MG 24 hr tablet Take 30 mg by mouth daily.    [provider]  loperamide (IMODIUM) 2 MG capsule Take 1 capsule (2 mg total) by mouth as needed for diarrhea or loose stools. 09/21/20   Arnetha Courser, MD  losartan (COZAAR) 25 MG tablet Take 1 tablet (25 mg total) by mouth daily. 09/21/20   Arnetha Courser, MD  methocarbamol (ROBAXIN) 500 MG tablet Take 500 mg by mouth 3 (three) times daily.    [provider]  oxyCODONE (OXYCONTIN) 60 MG 12 hr tablet Take by mouth 3 (three) times daily.    [provider]  pantoprazole (PROTONIX) 40 MG tablet Take 40 mg by mouth daily.    [provider]  polyethylene glycol (GOLYTELY) 236 g solution Drink one 8 oz glass every 20 mins until stools are clear 08/15/16   Midge Minium, MD  pravastatin (PRAVACHOL) 40 MG tablet Take 40 mg by mouth daily.    [provider]  saxagliptin HCl (ONGLYZA) 2.5 MG TABS tablet Take 2.5 mg by mouth daily.    [provider]  SUMAtriptan (IMITREX) 100 MG tablet Take 100 mg by mouth every 2 (two) hours as needed for migraine. May repeat in 2 hours if headache persists or recurs.    [provider]  vitamin C (ASCORBIC ACID) 500 MG tablet Take 500 mg by mouth daily.    [provider]    Allergies Shellfish allergy  Family History  Problem Relation Age of Onset   Hypertension Mother     Social History Social History   Tobacco Use   Smoking status: Every Day    Pack years: 0.00    Types: E-cigarettes   Smokeless tobacco: Never  Substance Use Topics   Alcohol use: No   Drug use: No    Review of Systems  Constitutional: No fever/chills.  Positive for presyncopal lightheaded dizziness Eyes: No visual changes. ENT: No sore throat. Cardiovascular: Denies chest pain. Respiratory: Denies shortness of breath. Gastrointestinal: No abdominal pain.  No nausea,  no vomiting.  No constipation. Positive for chronic watery diarrhea. Genitourinary: Negative for dysuria. Musculoskeletal: Negative for back pain. Skin: Negative for rash. Neurological: Negative for headaches, focal weakness or numbness. ____________________________________________   PHYSICAL EXAM:  VITAL SIGNS: Vitals:   01/09/21 0233 01/09/21 0300  BP: 92/62 99/64  Pulse: 73 71  Resp: 18 17  Temp:    SpO2: 95% 97%     Constitutional: Alert and oriented. Well appearing and in no acute distress.  Conversational. Eyes: Conjunctivae are normal. PERRL. EOMI. Head: Atraumatic. Nose: No congestion/rhinnorhea. Mouth/Throat: Mucous membranes are dry.  Oropharynx non-erythematous. Neck: No stridor. No cervical spine tenderness to palpation. Cardiovascular: Tachycardic and irregular rhythm. Grossly normal heart sounds.   Respiratory: Minimal tachypnea to about 20, no further evidence of distress.  Diffuse expiratory wheezes and slight decrease in air movement throughout. Gastrointestinal: Soft , nondistended,. No CVA tenderness. Mild suprapubic tenderness to palpation without peritoneal features.  Abdomen is otherwise benign. Musculoskeletal: No lower extremity tenderness nor edema.  No joint effusions. No signs of acute trauma. Neurologic:  Normal speech and language. No gross focal neurologic deficits are appreciated.  Skin:  Skin is warm, dry and intact. No rash noted. Psychiatric: Mood and affect are normal. Speech and behavior are normal.  ____________________________________________   LABS (all labs ordered are listed, but only abnormal results are displayed)  Labs Reviewed  BASIC METABOLIC PANEL - Abnormal; Notable for the following components:      Result Value   Sodium 133 (*)    CO2 19 (*)    Glucose, Bld 184 (*)    BUN 32 (*)    Creatinine, Ser 2.25 (*)    GFR, Estimated 23 (*)    All other components within normal limits  CBC - Abnormal; Notable for the following  components:   WBC 14.6 (*)    All other components within normal limits  URINALYSIS, COMPLETE (UACMP) WITH MICROSCOPIC - Abnormal; Notable for the following components:   Color, Urine AMBER (*)    APPearance CLOUDY (*)    Leukocytes,Ua MODERATE (*)    Bacteria, UA RARE (*)    All other components within normal limits  MAGNESIUM - Abnormal; Notable for the following components:   Magnesium 1.2 (*)    All other components within normal limits  TROPONIN I (HIGH SENSITIVITY) - Abnormal; Notable for the following components:   Troponin I (High Sensitivity) 28 (*)    All other components within normal limits  RESP PANEL BY RT-PCR (FLU A&B, COVID) ARPGX2  C DIFFICILE QUICK SCREEN W PCR REFLEX    GASTROINTESTINAL PANEL BY PCR, STOOL (REPLACES STOOL CULTURE)  PROTIME-INR  HEPATIC FUNCTION PANEL  TROPONIN I (HIGH SENSITIVITY)   ____________________________________________  12 Lead EKG  First EKG demonstrates A. fib with RVR, rate of 174 bpm.  Normal axis.  No STEMI.  Repeat EKG printed while I am in the room when she converts to a sinus rhythm demonstrates sinus tachycardia, rate of 106 bpm.  Couple PVCs.  Normal axis and intervals.  No ischemic features. ____________________________________________  RADIOLOGY  ED MD interpretation: 1 view CXR reviewed by me without evidence of acute cardiopulmonary pathology.  Official radiology report(s): CT ABDOMEN PELVIS WO CONTRAST  Result Date: 01/09/2021 CLINICAL DATA:  Diarrhea and acute kidney injury EXAM: CT ABDOMEN AND PELVIS WITHOUT CONTRAST TECHNIQUE: Multidetector CT imaging of the abdomen and pelvis was performed following the standard protocol without IV contrast. COMPARISON:  None. FINDINGS: LOWER CHEST: Normal. HEPATOBILIARY: Normal hepatic contours. No intra- or extrahepatic biliary dilatation. Status post cholecystectomy. PANCREAS: Normal pancreas. No ductal dilatation or peripancreatic fluid collection. SPLEEN: Normal.  ADRENALS/URINARY TRACT: The adrenal glands are normal. No hydronephrosis, nephroureterolithiasis or solid renal mass. 3.1 cm left renal cyst. The urinary bladder is normal for degree of distention STOMACH/BOWEL: There is no hiatal hernia. Normal duodenal course and caliber. No small bowel dilatation or inflammation. No focal colonic abnormality. Normal appendix. VASCULAR/LYMPHATIC: There is calcific atherosclerosis of the abdominal aorta. No lymphadenopathy. REPRODUCTIVE: Normal uterus. No adnexal mass. MUSCULOSKELETAL. No bony spinal canal stenosis or focal osseous abnormality. OTHER: None. IMPRESSION: No acute abnormality of the abdomen or pelvis. Aortic atherosclerosis (ICD10-I70.0). Electronically Signed   By: Deatra Robinson M.D.   On: 01/09/2021 02:38   DG Chest Portable 1 View  Result Date: 01/09/2021 CLINICAL DATA:  Dizziness and shortness of breath EXAM: PORTABLE CHEST 1 VIEW COMPARISON:  09/20/2020 FINDINGS: Cardiac shadow is within normal  limits. Aortic calcifications are again seen. Lungs are clear bilaterally. No bony abnormality is noted. IMPRESSION: No acute abnormality seen. Electronically Signed   By: Alcide Clever M.D.   On: 01/09/2021 00:14    ____________________________________________   PROCEDURES and INTERVENTIONS  Procedure(s) performed (including Critical Care):  .1-3 Lead EKG Interpretation  Date/Time: 01/09/2021 1:01 AM Performed by: Delton Prairie, MD Authorized by: Delton Prairie, MD     Interpretation: abnormal     ECG rate:  162   ECG rate assessment: tachycardic     Rhythm: atrial fibrillation     Ectopy: none     Conduction: normal   .Critical Care  Date/Time: 01/09/2021 1:01 AM Performed by: Delton Prairie, MD Authorized by: Delton Prairie, MD   Critical care provider statement:    Critical care time (minutes):  45   Critical care was necessary to treat or prevent imminent or life-threatening deterioration of the following conditions:  Circulatory failure and  cardiac failure   Critical care was time spent personally by me on the following activities:  Discussions with consultants, evaluation of patient's response to treatment, examination of patient, ordering and performing treatments and interventions, ordering and review of laboratory studies, ordering and review of radiographic studies, pulse oximetry, re-evaluation of patient's condition, obtaining history from patient or surrogate and review of old charts  Medications  lactated ringers bolus 1,000 mL (0 mLs Intravenous Stopped 01/09/21 0034)  methylPREDNISolone sodium succinate (SOLU-MEDROL) 125 mg/2 mL injection 125 mg (125 mg Intravenous Given 01/09/21 0036)  ipratropium-albuterol (DUONEB) 0.5-2.5 (3) MG/3ML nebulizer solution 3 mL (3 mLs Nebulization Given 01/09/21 0035)  lactated ringers bolus 1,000 mL (0 mLs Intravenous Stopped 01/09/21 0232)  magnesium sulfate IVPB 2 g 50 mL (0 g Intravenous Stopped 01/09/21 0242)    ____________________________________________   MDM / ED COURSE   75 year old woman presents with acute dizziness, with evidence of AKI and dehydration, COPD exacerbation and new onset atrial fibrillation requiring medical admission.  She presents in A. fib with RVR that self resolves prior to intervention, with soft blood pressures that improved with IV fluid resuscitation and not necessitating pressors.  Exam with some mild suprapubic tenderness to palpation, but no peritoneal features.  Further has evidence of COPD exacerbation with diffuse expiratory wheezes and decreased air movement throughout.  CXR without infiltrates and CT abdomen/pelvis without SBO or signs of colitis.  Blood work with AKI that appears prerenal in etiology considering her diarrhea and likely volume loss from this.  Awaiting stool studies and C. difficile panel.  Noted to have hypomagnesemia, possibly contributing to her A. fib, which was repleted IV.  No ischemic features on follow-up EKG with sinus rhythm.  No  recurrence of atrial fibrillation while in the ED.  Urine without infectious features..  We will discuss the case with hospitalist for admission.  Clinical Course as of 01/09/21 0414  Tue Jan 08, 2021  2342 When I first into the room, patient has heart rate 150s-170s with maps in the 50s, A. fib with RVR with irregularity on the monitor.  She is reporting dizziness.  As I am evaluating the patient and acquiring history/physical she converts to sinus rhythm with rates in the 100s, her BP improves and she reports improvement of her dizziness. [DS]  2357 Reassessed.  Daughter now at the bedside.  Provides some additional history.  Remains in a sinus tachycardic rhythm with maps in the 60s. [DS]  Wed Jan 09, 2021  0028 Reassessed.  Heart rate slowing to the  80s with a sinus rhythm.  Continues to look well [DS]  0057 Reassessed.  Heart rate slowing to the 70s.  Continues to look well, sitting up in bed.  Her blood pressures remain soft, but she is clinically improving and overall seems to be going the right direction. [DS]    Clinical Course User Index [DS] Delton PrairieSmith, Tekela Garguilo, MD    ____________________________________________   FINAL CLINICAL IMPRESSION(S) / ED DIAGNOSES  Final diagnoses:  Dizziness  AKI (acute kidney injury) (HCC)  New onset a-fib (HCC)  Chronic diarrhea  COPD exacerbation [facility](HCC)     ED Discharge Orders     None        Diamante Rubin   Note:  This document was prepared using Dragon voice recognition software and may include unintentional dictation errors.    Delton PrairieSmith, Elbia Paro, MD 01/09/21 [phone]

## 2021-01-09 ENCOUNTER — Inpatient Hospital Stay: Payer: Medicare Other

## 2021-01-09 ENCOUNTER — Emergency Department: Payer: Medicare Other

## 2021-01-09 DIAGNOSIS — I248 Other forms of acute ischemic heart disease: Secondary | ICD-10-CM | POA: Diagnosis present

## 2021-01-09 DIAGNOSIS — F17290 Nicotine dependence, other tobacco product, uncomplicated: Secondary | ICD-10-CM | POA: Diagnosis present

## 2021-01-09 DIAGNOSIS — Z6837 Body mass index (BMI) 37.0-37.9, adult: Secondary | ICD-10-CM | POA: Diagnosis not present

## 2021-01-09 DIAGNOSIS — R197 Diarrhea, unspecified: Secondary | ICD-10-CM | POA: Diagnosis present

## 2021-01-09 DIAGNOSIS — R778 Other specified abnormalities of plasma proteins: Secondary | ICD-10-CM

## 2021-01-09 DIAGNOSIS — G894 Chronic pain syndrome: Secondary | ICD-10-CM | POA: Diagnosis present

## 2021-01-09 DIAGNOSIS — A401 Sepsis due to streptococcus, group B: Secondary | ICD-10-CM | POA: Diagnosis present

## 2021-01-09 DIAGNOSIS — F32A Depression, unspecified: Secondary | ICD-10-CM | POA: Diagnosis present

## 2021-01-09 DIAGNOSIS — N1831 Chronic kidney disease, stage 3a: Secondary | ICD-10-CM

## 2021-01-09 DIAGNOSIS — K2100 Gastro-esophageal reflux disease with esophagitis, without bleeding: Secondary | ICD-10-CM

## 2021-01-09 DIAGNOSIS — I129 Hypertensive chronic kidney disease with stage 1 through stage 4 chronic kidney disease, or unspecified chronic kidney disease: Secondary | ICD-10-CM | POA: Diagnosis present

## 2021-01-09 DIAGNOSIS — I4891 Unspecified atrial fibrillation: Secondary | ICD-10-CM | POA: Diagnosis present

## 2021-01-09 DIAGNOSIS — J441 Chronic obstructive pulmonary disease with (acute) exacerbation: Secondary | ICD-10-CM | POA: Diagnosis present

## 2021-01-09 DIAGNOSIS — N170 Acute kidney failure with tubular necrosis: Secondary | ICD-10-CM | POA: Diagnosis present

## 2021-01-09 DIAGNOSIS — K219 Gastro-esophageal reflux disease without esophagitis: Secondary | ICD-10-CM | POA: Insufficient documentation

## 2021-01-09 DIAGNOSIS — E669 Obesity, unspecified: Secondary | ICD-10-CM | POA: Diagnosis present

## 2021-01-09 DIAGNOSIS — Z20822 Contact with and (suspected) exposure to covid-19: Secondary | ICD-10-CM | POA: Diagnosis present

## 2021-01-09 DIAGNOSIS — K529 Noninfective gastroenteritis and colitis, unspecified: Secondary | ICD-10-CM | POA: Diagnosis present

## 2021-01-09 DIAGNOSIS — N179 Acute kidney failure, unspecified: Secondary | ICD-10-CM | POA: Diagnosis not present

## 2021-01-09 DIAGNOSIS — N390 Urinary tract infection, site not specified: Secondary | ICD-10-CM | POA: Diagnosis present

## 2021-01-09 DIAGNOSIS — E785 Hyperlipidemia, unspecified: Secondary | ICD-10-CM | POA: Diagnosis present

## 2021-01-09 DIAGNOSIS — Z66 Do not resuscitate: Secondary | ICD-10-CM | POA: Diagnosis present

## 2021-01-09 DIAGNOSIS — R42 Dizziness and giddiness: Secondary | ICD-10-CM | POA: Diagnosis present

## 2021-01-09 DIAGNOSIS — G250 Essential tremor: Secondary | ICD-10-CM | POA: Diagnosis present

## 2021-01-09 DIAGNOSIS — I959 Hypotension, unspecified: Secondary | ICD-10-CM | POA: Diagnosis present

## 2021-01-09 DIAGNOSIS — E1122 Type 2 diabetes mellitus with diabetic chronic kidney disease: Secondary | ICD-10-CM | POA: Diagnosis present

## 2021-01-09 DIAGNOSIS — A419 Sepsis, unspecified organism: Secondary | ICD-10-CM

## 2021-01-09 DIAGNOSIS — N3000 Acute cystitis without hematuria: Secondary | ICD-10-CM

## 2021-01-09 DIAGNOSIS — I10 Essential (primary) hypertension: Secondary | ICD-10-CM

## 2021-01-09 DIAGNOSIS — F17210 Nicotine dependence, cigarettes, uncomplicated: Secondary | ICD-10-CM | POA: Diagnosis present

## 2021-01-09 DIAGNOSIS — I2510 Atherosclerotic heart disease of native coronary artery without angina pectoris: Secondary | ICD-10-CM | POA: Diagnosis present

## 2021-01-09 HISTORY — DX: Urinary tract infection, site not specified: N39.0

## 2021-01-09 HISTORY — DX: Diarrhea, unspecified: R19.7

## 2021-01-09 HISTORY — DX: Sepsis, unspecified organism: A41.9

## 2021-01-09 HISTORY — DX: Hypomagnesemia: E83.42

## 2021-01-09 LAB — URINALYSIS, COMPLETE (UACMP) WITH MICROSCOPIC
Bilirubin Urine: NEGATIVE
Glucose, UA: NEGATIVE mg/dL
Hgb urine dipstick: NEGATIVE
Ketones, ur: NEGATIVE mg/dL
Nitrite: NEGATIVE
Protein, ur: NEGATIVE mg/dL
Specific Gravity, Urine: 1.006 (ref 1.005–1.030)
pH: 5 (ref 5.0–8.0)

## 2021-01-09 LAB — PROTIME-INR
INR: 1.1 (ref 0.8–1.2)
Prothrombin Time: 14.1 seconds (ref 11.4–15.2)

## 2021-01-09 LAB — CBC
HCT: 42.9 % (ref 36.0–46.0)
Hemoglobin: 14.3 g/dL (ref 12.0–15.0)
MCH: 32.3 pg (ref 26.0–34.0)
MCHC: 33.3 g/dL (ref 30.0–36.0)
MCV: 96.8 fL (ref 80.0–100.0)
Platelets: 275 10*3/uL (ref 150–400)
RBC: 4.43 MIL/uL (ref 3.87–5.11)
RDW: 12.7 % (ref 11.5–15.5)
WBC: 14.6 10*3/uL — ABNORMAL HIGH (ref 4.0–10.5)
nRBC: 0 % (ref 0.0–0.2)

## 2021-01-09 LAB — RESP PANEL BY RT-PCR (FLU A&B, COVID) ARPGX2
Influenza A by PCR: NEGATIVE
Influenza B by PCR: NEGATIVE
SARS Coronavirus 2 by RT PCR: NEGATIVE

## 2021-01-09 LAB — HEMOGLOBIN A1C
Hgb A1c MFr Bld: 6.9 % — ABNORMAL HIGH (ref 4.8–5.6)
Mean Plasma Glucose: 151.33 mg/dL

## 2021-01-09 LAB — BASIC METABOLIC PANEL
Anion gap: 14 (ref 5–15)
BUN: 32 mg/dL — ABNORMAL HIGH (ref 8–23)
CO2: 19 mmol/L — ABNORMAL LOW (ref 22–32)
Calcium: 9.4 mg/dL (ref 8.9–10.3)
Chloride: 100 mmol/L (ref 98–111)
Creatinine, Ser: 2.25 mg/dL — ABNORMAL HIGH (ref 0.44–1.00)
GFR, Estimated: 23 mL/min — ABNORMAL LOW (ref >60)
Glucose, Bld: 184 mg/dL — ABNORMAL HIGH (ref 70–99)
Potassium: 3.5 mmol/L (ref 3.5–5.1)
Sodium: 133 mmol/L — ABNORMAL LOW (ref 135–145)

## 2021-01-09 LAB — TROPONIN I (HIGH SENSITIVITY)
Troponin I (High Sensitivity): 11 ng/L (ref <18)
Troponin I (High Sensitivity): 28 ng/L — ABNORMAL HIGH (ref <18)
Troponin I (High Sensitivity): 39 ng/L — ABNORMAL HIGH (ref <18)
Troponin I (High Sensitivity): 50 ng/L — ABNORMAL HIGH (ref <18)

## 2021-01-09 LAB — BRAIN NATRIURETIC PEPTIDE: B Natriuretic Peptide: 75.2 pg/mL (ref 0.0–100.0)

## 2021-01-09 LAB — CBG MONITORING, ED
Glucose-Capillary: 310 mg/dL — ABNORMAL HIGH (ref 70–99)
Glucose-Capillary: 271 mg/dL — ABNORMAL HIGH (ref 70–99)

## 2021-01-09 LAB — HEPATIC FUNCTION PANEL
ALT: 26 U/L (ref 0–44)
AST: 24 U/L (ref 15–41)
Albumin: 4.1 g/dL (ref 3.5–5.0)
Alkaline Phosphatase: 115 U/L (ref 38–126)
Bilirubin, Direct: <0.1 mg/dL (ref 0.0–0.2)
Total Bilirubin: 0.8 mg/dL (ref 0.3–1.2)
Total Protein: 7.1 g/dL (ref 6.5–8.1)

## 2021-01-09 LAB — MAGNESIUM: Magnesium: 1.2 mg/dL — ABNORMAL LOW (ref 1.7–2.4)

## 2021-01-09 LAB — PROCALCITONIN: Procalcitonin: <0.1 ng/mL

## 2021-01-09 LAB — LACTIC ACID, PLASMA: Lactic Acid, Venous: 1.9 mmol/L (ref 0.5–1.9)

## 2021-01-09 MED ORDER — ASPIRIN EC 81 MG PO TBEC
81.0000 mg | DELAYED_RELEASE_TABLET | Freq: Every day | ORAL | Status: DC
  Administered 2021-01-09 – 2021-01-11 (×3): 81 mg via ORAL
  Filled 2021-01-09 (×3): qty 1

## 2021-01-09 MED ORDER — AZITHROMYCIN 500 MG PO TABS
500.0000 mg | ORAL_TABLET | Freq: Every day | ORAL | Status: AC
  Administered 2021-01-09: 500 mg via ORAL
  Filled 2021-01-09: qty 1

## 2021-01-09 MED ORDER — METHYLPREDNISOLONE SODIUM SUCC 40 MG IJ SOLR
40.0000 mg | Freq: Two times a day (BID) | INTRAMUSCULAR | Status: DC
  Administered 2021-01-09 – 2021-01-11 (×5): 40 mg via INTRAVENOUS
  Filled 2021-01-09 (×5): qty 1

## 2021-01-09 MED ORDER — MAGNESIUM SULFATE 2 GM/50ML IV SOLN
2.0000 g | Freq: Once | INTRAVENOUS | Status: AC
  Administered 2021-01-09: 2 g via INTRAVENOUS
  Filled 2021-01-09: qty 50

## 2021-01-09 MED ORDER — ONDANSETRON HCL 4 MG/2ML IJ SOLN: 4.0000 mg | Freq: Three times a day (TID) | INTRAMUSCULAR | Status: DC | PRN

## 2021-01-09 MED ORDER — OXYCODONE HCL ER 15 MG PO T12A
60.0000 mg | EXTENDED_RELEASE_TABLET | Freq: Three times a day (TID) | ORAL | Status: DC
  Filled 2021-01-09: qty 4

## 2021-01-09 MED ORDER — LACTATED RINGERS IV BOLUS
500.0000 mL | Freq: Once | INTRAVENOUS | Status: AC
  Administered 2021-01-09: 500 mL via INTRAVENOUS

## 2021-01-09 MED ORDER — ROSUVASTATIN CALCIUM 10 MG PO TABS
20.0000 mg | ORAL_TABLET | Freq: Every day | ORAL | Status: DC
  Administered 2021-01-09 – 2021-01-10 (×2): 20 mg via ORAL
  Filled 2021-01-09: qty 1
  Filled 2021-01-09: qty 2

## 2021-01-09 MED ORDER — CLOPIDOGREL BISULFATE 75 MG PO TABS
75.0000 mg | ORAL_TABLET | Freq: Every day | ORAL | Status: DC
  Administered 2021-01-09 – 2021-01-11 (×3): 75 mg via ORAL
  Filled 2021-01-09 (×3): qty 1

## 2021-01-09 MED ORDER — LACTATED RINGERS IV BOLUS
1000.0000 mL | Freq: Once | INTRAVENOUS | Status: AC
  Administered 2021-01-09: 1000 mL via INTRAVENOUS

## 2021-01-09 MED ORDER — OXYCODONE HCL ER 15 MG PO T12A
60.0000 mg | EXTENDED_RELEASE_TABLET | Freq: Three times a day (TID) | ORAL | Status: DC
  Administered 2021-01-09 – 2021-01-11 (×6): 60 mg via ORAL
  Filled 2021-01-09 (×5): qty 4

## 2021-01-09 MED ORDER — SUMATRIPTAN SUCCINATE 50 MG PO TABS
100.0000 mg | ORAL_TABLET | ORAL | Status: DC | PRN
  Filled 2021-01-09: qty 2

## 2021-01-09 MED ORDER — CHOLESTYRAMINE 4 G PO PACK
1.0000 | PACK | Freq: Every day | ORAL | Status: DC
  Administered 2021-01-09: 1 via ORAL
  Filled 2021-01-09 (×4): qty 1

## 2021-01-09 MED ORDER — INSULIN ASPART 100 UNIT/ML IJ SOLN
0.0000 [IU] | Freq: Three times a day (TID) | INTRAMUSCULAR | Status: DC
  Administered 2021-01-09: 5 [IU] via SUBCUTANEOUS
  Administered 2021-01-09: 7 [IU] via SUBCUTANEOUS
  Administered 2021-01-10: 5 [IU] via SUBCUTANEOUS
  Administered 2021-01-10: 3 [IU] via SUBCUTANEOUS
  Administered 2021-01-10: 7 [IU] via SUBCUTANEOUS
  Administered 2021-01-11: 5 [IU] via SUBCUTANEOUS
  Filled 2021-01-09 (×3): qty 1

## 2021-01-09 MED ORDER — DM-GUAIFENESIN ER 30-600 MG PO TB12: 1.0000 | ORAL_TABLET | Freq: Two times a day (BID) | ORAL | Status: DC | PRN

## 2021-01-09 MED ORDER — ALBUTEROL SULFATE (2.5 MG/3ML) 0.083% IN NEBU: 2.5000 mg | INHALATION_SOLUTION | RESPIRATORY_TRACT | Status: DC | PRN

## 2021-01-09 MED ORDER — INSULIN ASPART 100 UNIT/ML IJ SOLN
0.0000 [IU] | Freq: Every day | INTRAMUSCULAR | Status: DC
  Administered 2021-01-09: 2 [IU] via SUBCUTANEOUS
  Filled 2021-01-09 (×2): qty 1

## 2021-01-09 MED ORDER — ASCORBIC ACID 500 MG PO TABS
500.0000 mg | ORAL_TABLET | Freq: Every day | ORAL | Status: DC
  Administered 2021-01-09 – 2021-01-11 (×3): 500 mg via ORAL
  Filled 2021-01-09 (×3): qty 1

## 2021-01-09 MED ORDER — PANTOPRAZOLE SODIUM 40 MG PO TBEC
40.0000 mg | DELAYED_RELEASE_TABLET | Freq: Every day | ORAL | Status: DC
  Administered 2021-01-09 – 2021-01-11 (×3): 40 mg via ORAL
  Filled 2021-01-09 (×3): qty 1

## 2021-01-09 MED ORDER — GABAPENTIN 400 MG PO CAPS
400.0000 mg | ORAL_CAPSULE | Freq: Three times a day (TID) | ORAL | Status: DC
  Administered 2021-01-09 – 2021-01-11 (×6): 400 mg via ORAL
  Filled 2021-01-09: qty 4
  Filled 2021-01-09 (×5): qty 1

## 2021-01-09 MED ORDER — POTASSIUM CHLORIDE CRYS ER 20 MEQ PO TBCR
40.0000 meq | EXTENDED_RELEASE_TABLET | Freq: Once | ORAL | Status: AC
  Administered 2021-01-09: 40 meq via ORAL
  Filled 2021-01-09: qty 2

## 2021-01-09 MED ORDER — ACETAMINOPHEN 325 MG PO TABS: 650.0000 mg | ORAL_TABLET | Freq: Four times a day (QID) | ORAL | Status: DC | PRN

## 2021-01-09 MED ORDER — ENOXAPARIN SODIUM 40 MG/0.4ML IJ SOSY
40.0000 mg | PREFILLED_SYRINGE | INTRAMUSCULAR | Status: DC
  Administered 2021-01-09 – 2021-01-10 (×2): 40 mg via SUBCUTANEOUS
  Filled 2021-01-09 (×2): qty 0.4

## 2021-01-09 MED ORDER — AZITHROMYCIN 250 MG PO TABS
250.0000 mg | ORAL_TABLET | Freq: Every day | ORAL | Status: DC
  Administered 2021-01-10 – 2021-01-11 (×2): 250 mg via ORAL
  Filled 2021-01-09 (×2): qty 1

## 2021-01-09 MED ORDER — IPRATROPIUM-ALBUTEROL 0.5-2.5 (3) MG/3ML IN SOLN
3.0000 mL | RESPIRATORY_TRACT | Status: DC
  Administered 2021-01-09 – 2021-01-11 (×10): 3 mL via RESPIRATORY_TRACT
  Filled 2021-01-09 (×10): qty 3

## 2021-01-09 MED ORDER — CITALOPRAM HYDROBROMIDE 20 MG PO TABS
20.0000 mg | ORAL_TABLET | Freq: Every day | ORAL | Status: DC
  Administered 2021-01-09 – 2021-01-11 (×3): 20 mg via ORAL
  Filled 2021-01-09 (×3): qty 1

## 2021-01-09 MED ORDER — NICOTINE 21 MG/24HR TD PT24
21.0000 mg | MEDICATED_PATCH | Freq: Every day | TRANSDERMAL | Status: DC
  Filled 2021-01-09 (×2): qty 1

## 2021-01-09 MED ORDER — SODIUM CHLORIDE 0.9 % IV SOLN
1.0000 g | INTRAVENOUS | Status: DC
  Administered 2021-01-09 – 2021-01-11 (×3): 1 g via INTRAVENOUS
  Filled 2021-01-09: qty 1
  Filled 2021-01-09: qty 10
  Filled 2021-01-09: qty 1

## 2021-01-09 MED ORDER — CYCLOBENZAPRINE HCL 10 MG PO TABS: 5.0000 mg | ORAL_TABLET | Freq: Three times a day (TID) | ORAL | Status: DC | PRN

## 2021-01-09 MED ORDER — AMITRIPTYLINE HCL 25 MG PO TABS
25.0000 mg | ORAL_TABLET | Freq: Every day | ORAL | Status: DC
  Administered 2021-01-09 – 2021-01-10 (×2): 25 mg via ORAL
  Filled 2021-01-09 (×2): qty 1

## 2021-01-09 NOTE — ED Notes (Signed)
Pt ambulatory to and from RR

## 2021-01-09 NOTE — ED Notes (Signed)
Report to Collyn, RN 

## 2021-01-09 NOTE — ED Notes (Signed)
Pt states she is breathing fine, no wheezing or resp.distress noted.

## 2021-01-09 NOTE — ED Notes (Signed)
MD notified of low BP. MD at bedside.

## 2021-01-09 NOTE — ED Notes (Signed)
Pt back from CT at this time 

## 2021-01-09 NOTE — H&P (Signed)
History and Physical    Stephanie Small ZOX:[id number] DOB: [birth year] DOA: 01/08/2021  Referring MD/NP/PA:   PCP: Lyndon CodeKhan, Fozia M, MD   Patient coming from:  The patient is coming from home.  Chief Complaint: SOB, diarrhea  HPI: Stephanie MustardMary H Dittus is a 75 y.o. female with medical history significant of HTN, HLD, COPD, GERD, CKD-IIIa, essential tremor, goiter, polypharmacy, chronic pain syndrome, smokes e-cigarettes. who presents with shortness breath, diarrhea.  Patient states that she has shortness of breath, mild dry cough and wheezing in the past several days.  She does not have chest pain, fever or chills.  She also reports diarrhea, which has been going intermittently for almost a month.  She feels dizzy and lightheaded.  No unilateral numbness or tingling in extremities. No facial droop or slurred speech.  No nausea, vomiting or abdominal pain.  She denies symptoms of UTI.  She has mild bilateral lower leg edema, right leg is worse than the left.  Per report, pt was initially found to have new onset atrial fibrillation with heart rate up to 170s, which was spontaneously converted to sinus rhythm in ED. When I saw pt in ED, her heart rate is 70-90s.  No chest pain. Her initial blood pressure was 72/46, which improved to 90/73 after giving 1 L of LR bolus in ED.  ED Course: pt was found to have troponin level 11, 28, INR 1.1, WBC 14.6, urinalysis (cloudy appearance, moderate amount of leukocyte, rare bacteria, WBC 6-10), worsening renal function, temperature normal, heart rate 98, RR 20, oxygen sat 90-97% on room air.  Chest x-ray negative.  CT abdomen/pelvis negative for acute intra-abdominal issues.  Patient is admitted to progressive bed as inpatient.  Dr. Welton FlakesKhan of cardiology is consulted.   Review of Systems:   General: no fevers, chills, no body weight gain, has poor appetite, has fatigue HEENT: no blurry vision, hearing changes or sore throat Respiratory: has dyspnea, coughing,  wheezing CV: no chest pain, no palpitations GI: no nausea, vomiting, abdominal pain, has diarrhea, no constipation GU: no dysuria, burning on urination, increased urinary frequency, hematuria  Ext: has leg edema Neuro: no unilateral weakness, numbness, or tingling, no vision change or hearing loss. Has lightheadedness and dizziness Skin: no rash, no skin tear. MSK: No muscle spasm, no deformity, no limitation of range of movement in spin Heme: No easy bruising.  Travel history: No recent long distant travel.  Allergy:  Allergies  Allergen Reactions   Shellfish Allergy Rash    Past Medical History:  Diagnosis Date   Arthritis    COPD (chronic obstructive pulmonary disease) (HCC)    Diabetes mellitus without complication (HCC)    Glaucoma 2010   Hypertension 2005   Personal history of colonic polyps    Thyroid cyst     Past Surgical History:  Procedure Laterality Date   CAROTID STENT INSERTION  2011   CHOLECYSTECTOMY  2009   COLONOSCOPY  2007   COLONOSCOPY WITH PROPOFOL N/A 08/18/2016   Procedure: COLONOSCOPY WITH PROPOFOL;  Surgeon: Midge Miniumarren Wohl, MD;  [facility];  Service: Endoscopy;  Laterality: N/A;   FOOT SURGERY     SHOULDER SURGERY Bilateral 2004   UPPER GI ENDOSCOPY      Social History:  reports that she has been smoking e-cigarettes. She has never used smokeless tobacco. She reports that she does not drink alcohol and does not use drugs.  Family History:  Family History  Problem Relation Age of Onset  Hypertension Mother      Prior to Admission medications   Medication Sig Start Date End Date Taking? Authorizing Provider  oxyCODONE (OXYCONTIN) 60 MG 12 hr tablet Take 60 mg by mouth 3 (three) times daily. 01/08/21  Yes [provider]  albuterol (PROVENTIL HFA;VENTOLIN HFA) 108 (90 Base) MCG/ACT inhaler Inhale into the lungs every 6 (six) hours as needed for wheezing or shortness of breath.    [provider]  alendronate  (FOSAMAX) 70 MG tablet Take 70 mg by mouth once a week. 11/28/20   [provider]  amitriptyline (ELAVIL) 25 MG tablet Take 25 mg by mouth at bedtime. 10/31/20   [provider]  aspirin 81 MG tablet Take 81 mg by mouth daily.    [provider]  cholestyramine (QUESTRAN) 4 g packet Take 1 packet by mouth daily. 01/03/21   [provider]  citalopram (CELEXA) 20 MG tablet Take 20 mg by mouth daily.    [provider]  clopidogrel (PLAVIX) 75 MG tablet Take 75 mg by mouth daily.    [provider]  diclofenac sodium (VOLTAREN) 1 % GEL Apply topically as needed.    [provider]  fluticasone furoate-vilanterol (BREO ELLIPTA) 100-25 MCG/INH AEPB Inhale 1 puff into the lungs daily.    [provider]  gabapentin (NEURONTIN) 400 MG capsule Take 400 mg by mouth daily.    [provider]  isosorbide mononitrate (IMDUR) 30 MG 24 hr tablet Take 30 mg by mouth daily.    [provider]  JANUVIA 100 MG tablet Take 100 mg by mouth daily. 11/27/20   [provider]  loperamide (IMODIUM) 2 MG capsule Take 1 capsule (2 mg total) by mouth as needed for diarrhea or loose stools. 09/21/20   Arnetha Courser, MD  losartan (COZAAR) 25 MG tablet Take 1 tablet (25 mg total) by mouth daily. 09/21/20   Arnetha Courser, MD  methocarbamol (ROBAXIN) 500 MG tablet Take 500 mg by mouth 3 (three) times daily.    [provider]  omeprazole (PRILOSEC) 40 MG capsule Take 40 mg by mouth daily. 12/25/20   [provider]  oxyCODONE (OXYCONTIN) 60 MG 12 hr tablet Take by mouth 3 (three) times daily.    [provider]  pantoprazole (PROTONIX) 40 MG tablet Take 40 mg by mouth daily.    [provider]  polyethylene glycol (GOLYTELY) 236 g solution Drink one 8 oz glass every 20 mins until stools are clear 08/15/16   Midge Minium, MD  pravastatin (PRAVACHOL) 40 MG tablet Take 40 mg by mouth daily.    [provider]  rosuvastatin (CRESTOR) 20 MG tablet Take 20 mg by mouth at bedtime. 10/31/20   [provider]  saxagliptin HCl (ONGLYZA) 2.5 MG TABS tablet Take 2.5 mg by mouth daily.    [provider]  SUMAtriptan (IMITREX) 100 MG tablet Take 100 mg by mouth every 2 (two) hours as needed for migraine. May repeat in 2 hours if headache persists or recurs.    [provider]  vitamin C (ASCORBIC ACID) 500 MG tablet Take 500 mg by mouth daily.    [provider]    Physical Exam: Vitals:   01/09/21 1245 01/09/21 1300 01/09/21 1315 01/09/21 1330  BP:  130/74  110/72  Pulse: 75 77 66 65  Resp: (!) 24 (!) Temp:      TempSrc:      SpO2: 95% 93% 93% 100%  Weight:      Height:       General: Not in acute distress HEENT:       Eyes: PERRL, EOMI, no scleral icterus.       ENT: No discharge from the ears and nose, no pharynx injection, no tonsillar enlargement.        Neck: No JVD, no bruit, no mass felt. Heme: No neck lymph node enlargement. Cardiac: S1/S2, RRR, No murmurs, No gallops or rubs. Respiratory: Has diffused rhonchi and wheezing bilaterally GI: Soft, nondistended, nontender, no rebound pain, no organomegaly, BS present. GU: No hematuria Ext: 1+DP/PT pulse bilaterally.  Has asymmetric leg edema, with trace left leg edema and 1+ right leg edema Musculoskeletal: No joint deformities, No joint redness or warmth, no limitation of ROM in spin. Skin: No rashes.  Neuro: Alert, oriented X3, cranial nerves II-XII grossly intact, moves all extremities normally.  Psych: Patient is not psychotic, no suicidal or hemocidal ideation.  Labs on Admission: I have personally reviewed following labs and imaging studies  CBC: Recent Labs  Lab 01/08/21 2329  WBC 14.6*  HGB 14.3  HCT 42.9  MCV 96.8  PLT 275   Basic Metabolic Panel: Recent Labs  Lab 01/08/21 2329 01/08/21 2351  NA 133*  --   K 3.5  --   CL 100  --   CO2 19*  --   GLUCOSE  184*  --   BUN 32*  --   CREATININE 2.25*  --   CALCIUM 9.4  --   MG  --  1.2*   GFR: Estimated Creatinine Clearance: 25.9 mL/min (A) (by C-G formula based on SCr of 2.25 mg/dL (H)). Liver Function Tests: Recent Labs  Lab 01/08/21 2351  AST 24  ALT 26  ALKPHOS 115  BILITOT 0.8  PROT 7.1  ALBUMIN 4.1   No results for input(s): LIPASE, AMYLASE in the last 168 hours. No results for input(s): AMMONIA in the last 168 hours. Coagulation Profile: Recent Labs  Lab 01/08/21 2351  INR 1.1   Cardiac Enzymes: No results for input(s): CKTOTAL, CKMB, CKMBINDEX, TROPONINI in the last 168 hours. BNP (last 3 results) No results for input(s): PROBNP in the last 8760 hours. HbA1C: No results for input(s): HGBA1C in the last 72 hours. CBG: Recent Labs  Lab 01/09/21 1324  GLUCAP 310*   Lipid Profile: No results for input(s): CHOL, HDL, LDLCALC, TRIG, CHOLHDL, LDLDIRECT in the last 72 hours. Thyroid Function Tests: No results for input(s): TSH, T4TOTAL, FREET4, T3FREE, THYROIDAB in the last 72 hours. Anemia Panel: No results for input(s): VITAMINB12, FOLATE, FERRITIN, TIBC, IRON, RETICCTPCT in the last 72 hours. Urine analysis:    Component Value Date/Time   COLORURINE AMBER (A) 01/08/2021 2351   APPEARANCEUR CLOUDY (A) 01/08/2021 2351   LABSPEC 1.006 01/08/2021 2351   PHURINE 5.0 01/08/2021 2351   GLUCOSEU NEGATIVE 01/08/2021 2351   HGBUR NEGATIVE 01/08/2021 2351   BILIRUBINUR NEGATIVE 01/08/2021 2351   KETONESUR NEGATIVE 01/08/2021 2351   PROTEINUR NEGATIVE 01/08/2021 2351   NITRITE NEGATIVE 01/08/2021 2351   LEUKOCYTESUR MODERATE (A) 01/08/2021 2351   Sepsis Labs: @LABRCNTIP (procalcitonin:4,lacticidven:4) ) Recent Results (from the past 240 hour(s))  Resp Panel by RT-PCR (Flu A&B, Covid) Nasopharyngeal Swab     Status: None   Collection Time: 01/08/21 11:51 PM   Specimen: Nasopharyngeal Swab; Nasopharyngeal(NP) swabs in vial transport medium  Result Value Ref Range  Status   SARS Coronavirus 2 by RT PCR NEGATIVE NEGATIVE Final    Comment: (NOTE)  SARS-CoV-2 target nucleic acids are NOT DETECTED.  The SARS-CoV-2 RNA is generally detectable in upper respiratory specimens during the acute phase of infection. The lowest concentration of SARS-CoV-2 viral copies this assay can detect is 138 copies/mL. A negative result does not preclude SARS-Cov-2 infection and should not be used as the sole basis for treatment or other patient management decisions. A negative result may occur with  improper specimen collection/handling, submission of specimen other than nasopharyngeal swab, presence of viral mutation(s) within the areas targeted by this assay, and inadequate number of viral copies(<138 copies/mL). A negative result must be combined with clinical observations, patient history, and epidemiological information. The expected result is Negative.  Fact Sheet for Patients:  BloggerCourse.com  Fact Sheet for Healthcare Providers:  SeriousBroker.it  This test is no t yet approved or cleared by the [place] FDA and  has been authorized for detection and/or diagnosis of SARS-CoV-2 by FDA under an Emergency Use Authorization (EUA). This EUA will remain  in effect (meaning this test can be used) for the duration of the COVID-19 declaration under Section 564(b)(1) of the Act, 21 U.S.C.section 360bbb-3(b)(1), unless the authorization is terminated  or revoked sooner.       Influenza A by PCR NEGATIVE NEGATIVE Final   Influenza B by PCR NEGATIVE NEGATIVE Final    Comment: (NOTE) The Xpert Xpress SARS-CoV-2/FLU/RSV plus assay is intended as an aid in the diagnosis of influenza from Nasopharyngeal swab specimens and should not be used as a sole basis for treatment. Nasal washings and aspirates are unacceptable for Xpert Xpress SARS-CoV-2/FLU/RSV testing.  Fact Sheet for  Patients: BloggerCourse.com  Fact Sheet for Healthcare Providers: SeriousBroker.it  This test is not yet approved or cleared by the [place] FDA and has been authorized for detection and/or diagnosis of SARS-CoV-2 by FDA under an Emergency Use Authorization (EUA). This EUA will remain in effect (meaning this test can be used) for the duration of the COVID-19 declaration under Section 564(b)(1) of the Act, 21 U.S.C. section 360bbb-3(b)(1), unless the authorization is terminated or revoked.  Performed at [facility], [address] [place], [place] [address]      Radiological Exams on Admission: CT ABDOMEN PELVIS WO CONTRAST  Result Date: 01/09/2021 CLINICAL DATA:  Diarrhea and acute kidney injury EXAM: CT ABDOMEN AND PELVIS WITHOUT CONTRAST TECHNIQUE: Multidetector CT imaging of the abdomen and pelvis was performed following the standard protocol without IV contrast. COMPARISON:  None. FINDINGS: LOWER CHEST: Normal. HEPATOBILIARY: Normal hepatic contours. No intra- or extrahepatic biliary dilatation. Status post cholecystectomy. PANCREAS: Normal pancreas. No ductal dilatation or peripancreatic fluid collection. SPLEEN: Normal. ADRENALS/URINARY TRACT: The adrenal glands are normal. No hydronephrosis, nephroureterolithiasis or solid renal mass. 3.1 cm left renal cyst. The urinary bladder is normal for degree of distention STOMACH/BOWEL: There is no hiatal hernia. Normal duodenal course and caliber. No small bowel dilatation or inflammation. No focal colonic abnormality. Normal appendix. VASCULAR/LYMPHATIC: There is calcific atherosclerosis of the abdominal aorta. No lymphadenopathy. REPRODUCTIVE: Normal uterus. No adnexal mass. MUSCULOSKELETAL. No bony spinal canal stenosis or focal osseous abnormality. OTHER: None. IMPRESSION: No acute abnormality of the abdomen or pelvis. Aortic atherosclerosis (ICD10-I70.0). Electronically Signed    By: Deatra Robinson M.D.   On: 01/09/2021 02:38   US Venous Img Lower Bilateral (DVT)  Result Date: 01/09/2021 CLINICAL DATA:  Asymmetric lower extremity edema. EXAM: BILATERAL LOWER EXTREMITY VENOUS DOPPLER ULTRASOUND TECHNIQUE: Gray-scale sonography with graded compression, as well as color Doppler and duplex ultrasound were performed to  evaluate the lower extremity deep venous systems from the level of the common femoral vein and including the common femoral, femoral, profunda femoral, popliteal and calf veins including the posterior tibial, peroneal and gastrocnemius veins when visible. The superficial great saphenous vein was also interrogated. Spectral Doppler was utilized to evaluate flow at rest and with distal augmentation maneuvers in the common femoral, femoral and popliteal veins. COMPARISON:  None. FINDINGS: RIGHT LOWER EXTREMITY Common Femoral Vein: No evidence of thrombus. Normal compressibility, respiratory phasicity and response to augmentation. Saphenofemoral Junction: No evidence of thrombus. Normal compressibility and flow on color Doppler imaging. Profunda Femoral Vein: No evidence of thrombus. Normal compressibility and flow on color Doppler imaging. Femoral Vein: No evidence of thrombus. Normal compressibility, respiratory phasicity and response to augmentation. Popliteal Vein: No evidence of thrombus. Normal compressibility, respiratory phasicity and response to augmentation. Calf Veins: No evidence of thrombus. Normal compressibility and flow on color Doppler imaging. LEFT LOWER EXTREMITY Common Femoral Vein: No evidence of thrombus. Normal compressibility, respiratory phasicity and response to augmentation. Saphenofemoral Junction: No evidence of thrombus. Normal compressibility and flow on color Doppler imaging. Profunda Femoral Vein: No evidence of thrombus. Normal compressibility and flow on color Doppler imaging. Femoral Vein: No evidence of thrombus. Normal compressibility,  respiratory phasicity and response to augmentation. Popliteal Vein: No evidence of thrombus. Normal compressibility, respiratory phasicity and response to augmentation. Calf Veins: No evidence of thrombus. Normal compressibility and flow on color Doppler imaging. IMPRESSION: No evidence of deep venous thrombosis in either lower extremity. Electronically Signed   By: Feliberto Harts MD   On: 01/09/2021 13:48   DG Chest Portable 1 View  Result Date: 01/09/2021 CLINICAL DATA:  Dizziness and shortness of breath EXAM: PORTABLE CHEST 1 VIEW COMPARISON:  09/20/2020 FINDINGS: Cardiac shadow is within normal limits. Aortic calcifications are again seen. Lungs are clear bilaterally. No bony abnormality is noted. IMPRESSION: No acute abnormality seen. Electronically Signed   By: Alcide Clever M.D.   On: 01/09/2021 00:14     EKG: I have personally reviewed.  First EKG showed atrial fibrillation, QTC 570, heart rate 174, low voltage and poor R wave progression.  The repeated EKG showed sinus rhythm with PVC.  Assessment/Plan Principal Problem:   COPD exacerbation (HCC) Active Problems:   Essential hypertension   Type II diabetes mellitus with renal manifestations (HCC)   Elevated troponin   HLD (hyperlipidemia)   Sepsis (HCC)   Depression   Acute renal failure superimposed on stage 3a chronic kidney disease (HCC)   UTI (urinary tract infection)   New onset atrial fibrillation (HCC)   Diarrhea   Hypomagnesemia   Chronic pain syndrome   COPD exacerbation (HCC): Patient has diffuse wheezing and rhonchi on auscultation, indicating COPD exacerbation.  Chest x-ray negative for infiltration.  Oxygen saturation 90-97% on room air currently.  - will admit to progressive unit as inpatient -Bronchodilators -Solu-Medrol 40 mg IV bid -Z pak  -Mucinex for cough  -Incentive spirometry - sputum culture -Nasal cannula oxygen as needed to maintain O2 saturation 93% or greater  UTI (urinary tract infection):  Urinalysis showed cloudy appearance, moderate amount of leukocyte, rare bacteria, WBC 6-10. She has worsening renal function -IV Rocephin -Follow-up blood culture and urine culture  Sepsis due to UTI and COPD exacerbation: Patient meets criteria for sepsis with leukocytosis with WBC 14.6, tachycardia with heart rate 98.  Pending lactic acid level. -will get Procalcitonin and trend lactic acid levels per sepsis protocol. -IVF: 1.5L of LR bolus  Essential hypertension: Patient was initially hypotensive.  Blood pressure still soft -Hold all blood pressure medications: Lasix, Cozaar, Imdur  Type II diabetes mellitus with renal manifestations [facility]): Recent A1c 7.1, poorly controlled.  Patient is not taking Januvia, Onglyza at home -SSI  Elevated troponin: trop  11 -->28. No CP.  Likely due to demand ischemia -Trend troponin -Aspirin, Crestor -Check A1c, FLP  HLD (hyperlipidemia) -Crestor  Depression -Continue home medications  Acute renal failure superimposed on stage 3a chronic kidney disease (HCC): Baseline creatinine 1.1-1.3.  Her creatinine is at 2.25, BUN 32.  Likely due to multifactorial etiology, including UTI, continuation of Cozaar and Lasix, and possible ATN due to hypotension. -IV fluid as above -Hold Cozaar and Lasix  New onset atrial fibrillation [facility]): Initially blood patient had new onset A. fib with RVR with heart rate up to 170, spontaneously converted to sinus rhythm.  Currently heart rate is 98. -Cardiac monitoring -Consulted Dr. Welton Flakes of card  Diarrhea -Check C diff and GI pathogen panel  Hypomagnesemia: Magnesium 1.2 -Give 2 g of magnesium sulfate -Potassium 3.5, will give 40 mEq of potassium chloride due to A. fib with RVR  Chronic pain syndrome -continue home OxyContin 60 mg 3 times daily  Asymmetric leg edema -f/u LE doppler to r/o DVT --> negative for DVT    DVT ppx: SQ Lovenox Code Status: DNR (I discussed with patient and explained the meaning of  CODE STATUS. Patient wants sto be DNR). DNR is confirmed by her daughter. Family Communication:  Yes, patient's daughter by phone Disposition Plan:  Anticipate discharge back to previous environment Consults called: Dr. Welton Flakes ofcardiology Admission status and Level of care: Progressive Cardiac:    as inpt       Status is: Inpatient  Remains inpatient appropriate because:Inpatient level of care appropriate due to severity of illness  Dispo: The patient is from: Home              Anticipated d/c is to: Home              Patient currently is not medically stable to d/c.   Difficult to place patient No          Date of Service 01/09/2021    Lorretta Harp [facility]   If 7PM-7AM, please contact night-coverage [email or web address] 01/09/2021, 6:06 PM

## 2021-01-09 NOTE — ED Notes (Signed)
Pt resting with no complaints, walked to the bsc with no issues to urinate. Tolerated well.

## 2021-01-09 NOTE — ED Notes (Signed)
Pt resting in bed with eyes closed, rise and fall of chest noted. No distress seen, call light in reach.

## 2021-01-09 NOTE — Progress Notes (Signed)
PHARMACIST - PHYSICIAN COMMUNICATION  CONCERNING:  Enoxaparin (Lovenox) for DVT Prophylaxis    RECOMMENDATION: Patient was prescribed enoxaprin 30mg  q24 hours for VTE prophylaxis.   Filed Weights   01/08/21 2332  Weight: 99.8 kg (220 lb)    Body mass index is 37.76 kg/m.  Estimated Creatinine Clearance: 25.9 mL/min (A) (by C-G formula based on SCr of 2.25 mg/dL (H)).   Based on [facility] policy patient is candidate for enoxaparin 0.5mg /kg TBW SQ every 24 hours based on BMI being >30.  Patient is candidate for enoxaparin 40mg  every 24 hours based on CrCl <69ml/min and BMI >30  DESCRIPTION: Pharmacy has adjusted enoxaparin dose per [facility] policy.  Patient is now receiving enoxaparin 40 mg every 24 hours    31m, PharmD Clinical Pharmacist  01/09/2021 3:31 PM

## 2021-01-09 NOTE — ED Notes (Signed)
ED Provider at bedside. 

## 2021-01-09 NOTE — ED Notes (Signed)
Call light in reach, no distress noted.

## 2021-01-09 NOTE — Progress Notes (Addendum)
Stephanie Small is a 75 y.o. female  [id number]  Primary Cardiologist: Stephanie Blackwater, MD Reason for Consultation: atrial fibrillation with RVR  HPI: Patient improved, denies dizziness. Patient denies chest pain, shortness of breath, palpitations.   Review of Systems: denies dizziness, chest pain, shortness of breath, palpitations.   Past Medical History:  Diagnosis Date   Arthritis    COPD (chronic obstructive pulmonary disease) (HCC)    Diabetes mellitus without complication (HCC)    Glaucoma 2010   Hypertension 2005   Personal history of colonic polyps    Thyroid cyst     (Not in a hospital admission)     [START ON 01/10/2021] azithromycin  250 mg Oral Daily   insulin aspart  0-5 Units Subcutaneous QHS   insulin aspart  0-9 Units Subcutaneous TID WC   ipratropium-albuterol  3 mL Nebulization Q4H   methylPREDNISolone (SOLU-MEDROL) injection  40 mg Intravenous Q12H   nicotine  21 mg Transdermal Daily   oxyCODONE  60 mg Oral TID    Infusions:  cefTRIAXone (ROCEPHIN)  IV Stopped (01/09/21 1202)    Allergies  Allergen Reactions   Shellfish Allergy Rash    Social History   Socioeconomic History   Marital status: Widowed    Spouse name: Not on file   Number of children: Not on file   Years of education: Not on file   Highest education level: Not on file  Occupational History   Not on file  Tobacco Use   Smoking status: Every Day    Pack years: 0.00    Types: E-cigarettes   Smokeless tobacco: Never  Substance and Sexual Activity   Alcohol use: No   Drug use: No   Sexual activity: Not Currently    Birth control/protection: Post-menopausal  Other Topics Concern   Not on file  Social History Narrative   Not on file   Social Determinants of Health   Financial Resource Strain: Not on file  Food Insecurity: Not on file  Transportation Needs: Not on file  Physical Activity: Not on file  Stress: Not on file  Social Connections: Not on file  Intimate  Partner Violence: Not on file    Family History  Problem Relation Age of Onset   Hypertension Mother     PHYSICAL EXAM: Vitals:   01/09/21 1315 01/09/21 1330  BP:  110/72  Pulse: 66 65  Resp: 18 16  Temp:    SpO2: 93% 100%     Intake/Output Summary (Last 24 hours) at 01/09/2021 1423 Last data filed at 01/09/2021 1202 Gross per 24 hour  Intake 100 ml  Output --  Net 100 ml    General:  Well appearing. No respiratory difficulty HEENT: normal Neck: supple. no JVD. Carotids 2+ bilat; no bruits. No lymphadenopathy or thryomegaly appreciated. Cor: PMI nondisplaced. Regular rate & rhythm. No rubs, gallops or murmurs. Lungs: clear Abdomen: soft, nontender, nondistended. No hepatosplenomegaly. No bruits or masses. Good bowel sounds. Extremities: no cyanosis, clubbing, rash, edema Neuro: alert & oriented x 3, cranial nerves grossly intact. moves all 4 extremities w/o difficulty. Affect pleasant.   Results for orders placed or performed during the hospital encounter of 01/08/21 (from the past 24 hour(s))  Basic metabolic panel     Status: Abnormal   Collection Time: 01/08/21 11:29 PM  Result Value Ref Range   Sodium 133 (L) 135 - 145 mmol/L   Potassium 3.5 3.5 - 5.1 mmol/L   Chloride 100 98 - 111 mmol/L  CO2 19 (L) 22 - 32 mmol/L   Glucose, Bld 184 (H) 70 - 99 mg/dL   BUN 32 (H) 8 - 23 mg/dL   Creatinine, Ser 1.192.25 (H) 0.44 - 1.00 mg/dL   Calcium 9.4 8.9 - 14.710.3 mg/dL   GFR, Estimated 23 (L) >60 mL/min   Anion gap 14 5 - 15  CBC     Status: Abnormal   Collection Time: 01/08/21 11:29 PM  Result Value Ref Range   WBC 14.6 (H) 4.0 - 10.5 K/uL   RBC 4.43 3.87 - 5.11 MIL/uL   Hemoglobin 14.3 12.0 - 15.0 g/dL   HCT 82.942.9 56.236.0 - 13.046.0 %   MCV 96.8 80.0 - 100.0 fL   MCH 32.3 26.0 - 34.0 pg   MCHC 33.3 30.0 - 36.0 g/dL   RDW 86.512.7 78.411.5 - 69.615.5 %   Platelets 275 150 - 400 K/uL   nRBC 0.0 0.0 - 0.2 %  Urinalysis, Complete w Microscopic Urine, Clean Catch     Status: Abnormal    Collection Time: 01/08/21 11:51 PM  Result Value Ref Range   Color, Urine [name] (A) YELLOW   APPearance CLOUDY (A) CLEAR   Specific Gravity, Urine 1.006 1.005 - 1.030   pH 5.0 5.0 - 8.0   Glucose, UA NEGATIVE NEGATIVE mg/dL   Hgb urine dipstick NEGATIVE NEGATIVE   Bilirubin Urine NEGATIVE NEGATIVE   Ketones, ur NEGATIVE NEGATIVE mg/dL   Protein, ur NEGATIVE NEGATIVE mg/dL   Nitrite NEGATIVE NEGATIVE   Leukocytes,Ua MODERATE (A) NEGATIVE   RBC / HPF 0-5 0 - 5 RBC/hpf   WBC, UA 6-10 0 - 5 WBC/hpf   Bacteria, UA RARE (A) NONE SEEN   Squamous Epithelial / LPF 0-5 0 - 5   Mucus PRESENT    Hyaline Casts, UA PRESENT    Amorphous Crystal PRESENT   Protime-INR     Status: None   Collection Time: 01/08/21 11:51 PM  Result Value Ref Range   Prothrombin Time 14.1 11.4 - 15.2 seconds   INR 1.1 0.8 - 1.2  Hepatic function panel     Status: None   Collection Time: 01/08/21 11:51 PM  Result Value Ref Range   Total Protein 7.1 6.5 - 8.1 g/dL   Albumin 4.1 3.5 - 5.0 g/dL   AST 24 15 - 41 U/L   ALT 26 0 - 44 U/L   Alkaline Phosphatase 115 38 - 126 U/L   Total Bilirubin 0.8 0.3 - 1.2 mg/dL   Bilirubin, Direct <2.9<0.1 0.0 - 0.2 mg/dL   Indirect Bilirubin NOT CALCULATED 0.3 - 0.9 mg/dL  Magnesium     Status: Abnormal   Collection Time: 01/08/21 11:51 PM  Result Value Ref Range   Magnesium 1.2 (L) 1.7 - 2.4 mg/dL  Resp Panel by RT-PCR (Flu A&B, Covid) Nasopharyngeal Swab     Status: None   Collection Time: 01/08/21 11:51 PM   Specimen: Nasopharyngeal Swab; Nasopharyngeal(NP) swabs in vial transport medium  Result Value Ref Range   SARS Coronavirus 2 by RT PCR NEGATIVE NEGATIVE   Influenza A by PCR NEGATIVE NEGATIVE   Influenza B by PCR NEGATIVE NEGATIVE  Troponin I (High Sensitivity)     Status: None   Collection Time: 01/08/21 11:51 PM  Result Value Ref Range   Troponin I (High Sensitivity) 11 <18 ng/L  Troponin I (High Sensitivity)     Status: Abnormal   Collection Time: 01/09/21  1:38 AM   Result Value Ref Range   Troponin I (  High Sensitivity) 28 (H) <18 ng/L  CBG monitoring, ED     Status: Abnormal   Collection Time: 01/09/21  1:24 PM  Result Value Ref Range   Glucose-Capillary 310 (H) 70 - 99 mg/dL   CT ABDOMEN PELVIS WO CONTRAST  Result Date: 01/09/2021 CLINICAL DATA:  Diarrhea and acute kidney injury EXAM: CT ABDOMEN AND PELVIS WITHOUT CONTRAST TECHNIQUE: Multidetector CT imaging of the abdomen and pelvis was performed following the standard protocol without IV contrast. COMPARISON:  None. FINDINGS: LOWER CHEST: Normal. HEPATOBILIARY: Normal hepatic contours. No intra- or extrahepatic biliary dilatation. Status post cholecystectomy. PANCREAS: Normal pancreas. No ductal dilatation or peripancreatic fluid collection. SPLEEN: Normal. ADRENALS/URINARY TRACT: The adrenal glands are normal. No hydronephrosis, nephroureterolithiasis or solid renal mass. 3.1 cm left renal cyst. The urinary bladder is normal for degree of distention STOMACH/BOWEL: There is no hiatal hernia. Normal duodenal course and caliber. No small bowel dilatation or inflammation. No focal colonic abnormality. Normal appendix. VASCULAR/LYMPHATIC: There is calcific atherosclerosis of the abdominal aorta. No lymphadenopathy. REPRODUCTIVE: Normal uterus. No adnexal mass. MUSCULOSKELETAL. No bony spinal canal stenosis or focal osseous abnormality. OTHER: None. IMPRESSION: No acute abnormality of the abdomen or pelvis. Aortic atherosclerosis (ICD10-I70.0). Electronically Signed   By: Deatra Robinson M.D.   On: 01/09/2021 02:38   US Venous Img Lower Bilateral (DVT)  Result Date: 01/09/2021 CLINICAL DATA:  Asymmetric lower extremity edema. EXAM: BILATERAL LOWER EXTREMITY VENOUS DOPPLER ULTRASOUND TECHNIQUE: Gray-scale sonography with graded compression, as well as color Doppler and duplex ultrasound were performed to evaluate the lower extremity deep venous systems from the level of the common femoral vein and including the  common femoral, femoral, profunda femoral, popliteal and calf veins including the posterior tibial, peroneal and gastrocnemius veins when visible. The superficial great saphenous vein was also interrogated. Spectral Doppler was utilized to evaluate flow at rest and with distal augmentation maneuvers in the common femoral, femoral and popliteal veins. COMPARISON:  None. FINDINGS: RIGHT LOWER EXTREMITY Common Femoral Vein: No evidence of thrombus. Normal compressibility, respiratory phasicity and response to augmentation. Saphenofemoral Junction: No evidence of thrombus. Normal compressibility and flow on color Doppler imaging. Profunda Femoral Vein: No evidence of thrombus. Normal compressibility and flow on color Doppler imaging. Femoral Vein: No evidence of thrombus. Normal compressibility, respiratory phasicity and response to augmentation. Popliteal Vein: No evidence of thrombus. Normal compressibility, respiratory phasicity and response to augmentation. Calf Veins: No evidence of thrombus. Normal compressibility and flow on color Doppler imaging. LEFT LOWER EXTREMITY Common Femoral Vein: No evidence of thrombus. Normal compressibility, respiratory phasicity and response to augmentation. Saphenofemoral Junction: No evidence of thrombus. Normal compressibility and flow on color Doppler imaging. Profunda Femoral Vein: No evidence of thrombus. Normal compressibility and flow on color Doppler imaging. Femoral Vein: No evidence of thrombus. Normal compressibility, respiratory phasicity and response to augmentation. Popliteal Vein: No evidence of thrombus. Normal compressibility, respiratory phasicity and response to augmentation. Calf Veins: No evidence of thrombus. Normal compressibility and flow on color Doppler imaging. IMPRESSION: No evidence of deep venous thrombosis in either lower extremity. Electronically Signed   By: Feliberto Harts MD   On: 01/09/2021 13:48   DG Chest Portable 1 View  Result Date:  01/09/2021 CLINICAL DATA:  Dizziness and shortness of breath EXAM: PORTABLE CHEST 1 VIEW COMPARISON:  09/20/2020 FINDINGS: Cardiac shadow is within normal limits. Aortic calcifications are again seen. Lungs are clear bilaterally. No bony abnormality is noted. IMPRESSION: No acute abnormality seen. Electronically Signed   By: Alcide Clever  M.D.   On: 01/09/2021 00:14    TELE: NSR, HR 71  ASSESSMENT AND PLAN: Troponin mildly elevated, no indication for heart cath right now. Blood pressure and HR/rhythm improved with fluids.  Google , FNP-C

## 2021-01-09 NOTE — ED Notes (Signed)
Report given to Ji, RN.

## 2021-01-09 NOTE — ED Notes (Signed)
[place] at bedside, daughter in room.

## 2021-01-10 DIAGNOSIS — I4891 Unspecified atrial fibrillation: Secondary | ICD-10-CM | POA: Diagnosis not present

## 2021-01-10 DIAGNOSIS — N390 Urinary tract infection, site not specified: Secondary | ICD-10-CM | POA: Diagnosis not present

## 2021-01-10 DIAGNOSIS — J441 Chronic obstructive pulmonary disease with (acute) exacerbation: Secondary | ICD-10-CM | POA: Diagnosis not present

## 2021-01-10 LAB — CBC
HCT: 33 % — ABNORMAL LOW (ref 36.0–46.0)
Hemoglobin: 11.2 g/dL — ABNORMAL LOW (ref 12.0–15.0)
MCH: 32.8 pg (ref 26.0–34.0)
MCHC: 33.9 g/dL (ref 30.0–36.0)
MCV: 96.8 fL (ref 80.0–100.0)
Platelets: 209 10*3/uL (ref 150–400)
RBC: 3.41 MIL/uL — ABNORMAL LOW (ref 3.87–5.11)
RDW: 12.9 % (ref 11.5–15.5)
WBC: 9.8 10*3/uL (ref 4.0–10.5)
nRBC: 0 % (ref 0.0–0.2)

## 2021-01-10 LAB — GLUCOSE, CAPILLARY
Glucose-Capillary: 248 mg/dL — ABNORMAL HIGH (ref 70–99)
Glucose-Capillary: 260 mg/dL — ABNORMAL HIGH (ref 70–99)
Glucose-Capillary: 343 mg/dL — ABNORMAL HIGH (ref 70–99)
Glucose-Capillary: 107 mg/dL — ABNORMAL HIGH (ref 70–99)

## 2021-01-10 LAB — LIPID PANEL
Cholesterol: 107 mg/dL (ref 0–200)
HDL: 43 mg/dL (ref >40)
LDL Cholesterol: 51 mg/dL (ref 0–99)
Total CHOL/HDL Ratio: 2.5 RATIO
Triglycerides: 66 mg/dL (ref <150)
VLDL: 13 mg/dL (ref 0–40)

## 2021-01-10 LAB — BASIC METABOLIC PANEL
Anion gap: 6 (ref 5–15)
BUN: 30 mg/dL — ABNORMAL HIGH (ref 8–23)
CO2: 23 mmol/L (ref 22–32)
Calcium: 8.9 mg/dL (ref 8.9–10.3)
Chloride: 106 mmol/L (ref 98–111)
Creatinine, Ser: 1.12 mg/dL — ABNORMAL HIGH (ref 0.44–1.00)
GFR, Estimated: 52 mL/min — ABNORMAL LOW (ref >60)
Glucose, Bld: 244 mg/dL — ABNORMAL HIGH (ref 70–99)
Potassium: 5 mmol/L (ref 3.5–5.1)
Sodium: 135 mmol/L (ref 135–145)

## 2021-01-10 LAB — URINE CULTURE: Culture: 50000 — AB

## 2021-01-10 LAB — CBG MONITORING, ED: Glucose-Capillary: 209 mg/dL — ABNORMAL HIGH (ref 70–99)

## 2021-01-10 LAB — TSH: TSH: 0.077 u[IU]/mL — ABNORMAL LOW (ref 0.350–4.500)

## 2021-01-10 LAB — MAGNESIUM: Magnesium: 1.7 mg/dL (ref 1.7–2.4)

## 2021-01-10 NOTE — Progress Notes (Signed)
PROGRESS NOTE    Stephanie Small  IEP:[id number] DOB: 06/22/1946 DOA: 01/08/2021 PCP: Lyndon Code, MD    Assessment & Plan:   Principal Problem:   COPD exacerbation [facility]) Active Problems:   Essential hypertension   Type II diabetes mellitus with renal manifestations (HCC)   Elevated troponin   HLD (hyperlipidemia)   Sepsis (HCC)   Depression   Acute renal failure superimposed on stage 3a chronic kidney disease (HCC)   UTI (urinary tract infection)   New onset atrial fibrillation (HCC)   Diarrhea   Hypomagnesemia   Chronic pain syndrome    COPD exacerbation: continue on azithromycin, IV steroids & bronchodilators. Encourage incentive spirometry. Mucinex for cough   UTI: UA was positive. Urine cx growing group B strep. Continue on IV rocephin    Sepsis due to UTI and COPD exacerbation: meets criteria  w/ leukocytosis, tachycardia. Continue on IVFs   HTN: continue to hold home dose of lasix, losartan & imdur as BP is low normal    DM2:  HbA1c 7.1, poorly controlled. Continue on SSI w/ accuchecks    Elevated troponin: likely secondary to demand ischemia    HLD: continue on statin    Depression: severity unknown. Continue on home dose of citalopram   AKI on CKDIIIa: baseline Cr 1.1-1.3. Likely due to multifactorial etiology, including UTI, continuation of Cozaar and Lasix, and possible ATN due to hypotension.  New onset atrial fibrillation: initially had new onset A. fib with RVR with heart rate up to 170, spontaneously converted to sinus rhythm. Continue on tele. Cardio consulted (Dr. Welton Flakes)    Diarrhea: GI PCR panel & c. diff ordered but not collected yet    Hypomagnesemia: WNL today   Chronic pain syndrome: continue on home dose of oxycontin    Asymmetric leg edema: [place] of b/l LE neg for DVTs   DVT prophylaxis: lovenox  Code Status: DNR Family Communication: called pt's daughter but no answer and unable to leave a voicemail  Disposition Plan: depends on  PT/OT recs   Level of care: Progressive Cardiac   Status is: Inpatient  Remains inpatient appropriate because:IV treatments appropriate due to intensity of illness or inability to take PO and Inpatient level of care appropriate due to severity of illness  Dispo: The patient is from: Home              Anticipated d/c is to: Home              Patient currently is not medically stable to d/c.   Difficult to place patient : unclear   Consultants:    Procedures:   Antimicrobials: azithromycin, rocephin    Subjective: Pt c/o shortness of breath  Objective: Vitals:   01/10/21 0800 01/10/21 0845 01/10/21 1151 01/10/21 1233  BP: (!) 113/54 102/70  (!) 126/58  Pulse: 71 67  63  Resp: 14     Temp:  97.7 F (36.5 C)  97.9 F (36.6 C)  TempSrc:      SpO2: 90% 97% 95% 96%  Weight:  98.7 kg    Height:  5\' 4"  (1.626 m)     No intake or output data in the 24 hours ending 01/10/21 1547  Filed Weights   01/08/21 2332 01/10/21 0845  Weight: 99.8 kg 98.7 kg    Examination:  General exam: Appears calm and comfortable  Respiratory system: diminished breath sounds b/l  Cardiovascular system: irregularly irregular. No  rubs, gallops or clicks.  Gastrointestinal system: Abdomen is  nondistended, soft and nontender. Hyperactive bowel sounds heard. Central nervous system: Alert and awake. Moves all extremities. Psychiatry: Judgement and insight appear abnormal. Flat mood and affect     Data Reviewed: I have personally reviewed following labs and imaging studies  CBC: Recent Labs  Lab 01/08/21 2329 01/10/21 0659  WBC 14.6* 9.8  HGB 14.3 11.2*  HCT 42.9 33.0*  MCV 96.8 96.8  PLT 275 209   Basic Metabolic Panel: Recent Labs  Lab 01/08/21 2329 01/08/21 2351 01/10/21 0659  NA 133*  --  135  K 3.5  --  5.0  CL 100  --  106  CO2 19*  --  23  GLUCOSE 184*  --  244*  BUN 32*  --  30*  CREATININE 2.25*  --  1.12*  CALCIUM 9.4  --  8.9  MG  --  1.2* 1.7    GFR: Estimated Creatinine Clearance: 51.8 mL/min (A) (by C-G formula based on SCr of 1.12 mg/dL (H)). Liver Function Tests: Recent Labs  Lab 01/08/21 2351  AST 24  ALT 26  ALKPHOS 115  BILITOT 0.8  PROT 7.1  ALBUMIN 4.1   No results for input(s): LIPASE, AMYLASE in the last 168 hours. No results for input(s): AMMONIA in the last 168 hours. Coagulation Profile: Recent Labs  Lab 01/08/21 2351  INR 1.1   Cardiac Enzymes: No results for input(s): CKTOTAL, CKMB, CKMBINDEX, TROPONINI in the last 168 hours. BNP (last 3 results) No results for input(s): PROBNP in the last 8760 hours. HbA1C: Recent Labs    01/09/21 1359  HGBA1C 6.9*   CBG: Recent Labs  Lab 01/09/21 1324 01/09/21 2104 01/09/21 2339 01/10/21 0842 01/10/21 1231  GLUCAP 310* 271* 209* 248* 260*   Lipid Profile: Recent Labs    01/10/21 0659  CHOL 107  HDL 43  LDLCALC 51  TRIG 66  CHOLHDL 2.5   Thyroid Function Tests: Recent Labs    01/10/21 0659  TSH 0.077*   Anemia Panel: No results for input(s): VITAMINB12, FOLATE, FERRITIN, TIBC, IRON, RETICCTPCT in the last 72 hours. Sepsis Labs: Recent Labs  Lab 01/09/21 1359  PROCALCITON <0.10  LATICACIDVEN 1.9    Recent Results (from the past 240 hour(s))  Resp Panel by RT-PCR (Flu A&B, Covid) Nasopharyngeal Swab     Status: None   Collection Time: 01/08/21 11:51 PM   Specimen: Nasopharyngeal Swab; Nasopharyngeal(NP) swabs in vial transport medium  Result Value Ref Range Status   SARS Coronavirus 2 by RT PCR NEGATIVE NEGATIVE Final    Comment: (NOTE) SARS-CoV-2 target nucleic acids are NOT DETECTED.  The SARS-CoV-2 RNA is generally detectable in upper respiratory specimens during the acute phase of infection. The lowest concentration of SARS-CoV-2 viral copies this assay can detect is 138 copies/mL. A negative result does not preclude SARS-Cov-2 infection and should not be used as the sole basis for treatment or other patient management  decisions. A negative result may occur with  improper specimen collection/handling, submission of specimen other than nasopharyngeal swab, presence of viral mutation(s) within the areas targeted by this assay, and inadequate number of viral copies(<138 copies/mL). A negative result must be combined with clinical observations, patient history, and epidemiological information. The expected result is Negative.  Fact Sheet for Patients:  BloggerCourse.com  Fact Sheet for Healthcare Providers:  SeriousBroker.it  This test is no t yet approved or cleared by the [place] FDA and  has been authorized for detection and/or diagnosis of SARS-CoV-2 by FDA under an Emergency  Use Authorization (EUA). This EUA will remain  in effect (meaning this test can be used) for the duration of the COVID-19 declaration under Section 564(b)(1) of the Act, 21 U.S.C.section 360bbb-3(b)(1), unless the authorization is terminated  or revoked sooner.       Influenza A by PCR NEGATIVE NEGATIVE Final   Influenza B by PCR NEGATIVE NEGATIVE Final    Comment: (NOTE) The Xpert Xpress SARS-CoV-2/FLU/RSV plus assay is intended as an aid in the diagnosis of influenza from Nasopharyngeal swab specimens and should not be used as a sole basis for treatment. Nasal washings and aspirates are unacceptable for Xpert Xpress SARS-CoV-2/FLU/RSV testing.  Fact Sheet for Patients: BloggerCourse.comhttps://www.fda.gov/media/152166/download  Fact Sheet for Healthcare Providers: SeriousBroker.ithttps://www.fda.gov/media/152162/download  This test is not yet approved or cleared by the [place] FDA and has been authorized for detection and/or diagnosis of SARS-CoV-2 by FDA under an Emergency Use Authorization (EUA). This EUA will remain in effect (meaning this test can be used) for the duration of the COVID-19 declaration under Section 564(b)(1) of the Act, 21 U.S.C. section 360bbb-3(b)(1), unless the  authorization is terminated or revoked.  Performed at [facility], [address] [place], [place] [address]   Urine Culture     Status: Abnormal   Collection Time: 01/09/21  2:30 AM   Specimen: Urine, Random  Result Value Ref Range Status   Specimen Description   Final    URINE, RANDOM Performed at [facility], [address] [place], [place] [address]    Special Requests   Final    NONE Performed at [facility], [address] [place], [place] [address]    Culture (A)  Final    50,000 COLONIES/mL GROUP B STREP(S.AGALACTIAE)ISOLATED TESTING AGAINST S. AGALACTIAE NOT ROUTINELY PERFORMED DUE TO PREDICTABILITY OF AMP/PEN/VAN SUSCEPTIBILITY. Performed at [facility], [address] [place], [place] [address]    Report Status 01/10/2021 FINAL  Final  Culture, blood (x 2)     Status: None (Preliminary result)   Collection Time: 01/09/21  2:00 PM   Specimen: BLOOD  Result Value Ref Range Status   Specimen Description BLOOD BLOOD RIGHT HAND  Final   Special Requests   Final    BOTTLES DRAWN AEROBIC AND ANAEROBIC Blood Culture adequate volume   Culture   Final    NO GROWTH < 24 HOURS Performed at [facility], [address] [place], [place] [address]    Report Status PENDING  Incomplete  Culture, blood (x 2)     Status: None (Preliminary result)   Collection Time: 01/09/21  2:01 PM   Specimen: BLOOD  Result Value Ref Range Status   Specimen Description BLOOD BLOOD LEFT FOREARM  Final   Special Requests   Final    BOTTLES DRAWN AEROBIC AND ANAEROBIC Blood Culture adequate volume   Culture   Final    NO GROWTH < 24 HOURS Performed at [facility], [address] [place], [place] [address]    Report Status PENDING  Incomplete         Radiology Studies: CT ABDOMEN PELVIS WO CONTRAST  Result Date: 01/09/2021 CLINICAL DATA:  Diarrhea and acute kidney injury EXAM: CT ABDOMEN AND PELVIS WITHOUT CONTRAST  TECHNIQUE: Multidetector CT imaging of the abdomen and pelvis was performed following the standard protocol without IV contrast. COMPARISON:  None. FINDINGS: LOWER CHEST: Normal. HEPATOBILIARY: Normal hepatic contours. No intra- or extrahepatic biliary dilatation. Status post cholecystectomy. PANCREAS: Normal pancreas. No ductal dilatation or  peripancreatic fluid collection. SPLEEN: Normal. ADRENALS/URINARY TRACT: The adrenal glands are normal. No hydronephrosis, nephroureterolithiasis or solid renal mass. 3.1 cm left renal cyst. The urinary bladder is normal for degree of distention STOMACH/BOWEL: There is no hiatal hernia. Normal duodenal course and caliber. No small bowel dilatation or inflammation. No focal colonic abnormality. Normal appendix. VASCULAR/LYMPHATIC: There is calcific atherosclerosis of the abdominal aorta. No lymphadenopathy. REPRODUCTIVE: Normal uterus. No adnexal mass. MUSCULOSKELETAL. No bony spinal canal stenosis or focal osseous abnormality. OTHER: None. IMPRESSION: No acute abnormality of the abdomen or pelvis. Aortic atherosclerosis (ICD10-I70.0). Electronically Signed   By: Deatra Robinson M.D.   On: 01/09/2021 02:38   US Venous Img Lower Bilateral (DVT)  Result Date: 01/09/2021 CLINICAL DATA:  Asymmetric lower extremity edema. EXAM: BILATERAL LOWER EXTREMITY VENOUS DOPPLER ULTRASOUND TECHNIQUE: Gray-scale sonography with graded compression, as well as color Doppler and duplex ultrasound were performed to evaluate the lower extremity deep venous systems from the level of the common femoral vein and including the common femoral, femoral, profunda femoral, popliteal and calf veins including the posterior tibial, peroneal and gastrocnemius veins when visible. The superficial great saphenous vein was also interrogated. Spectral Doppler was utilized to evaluate flow at rest and with distal augmentation maneuvers in the common femoral, femoral and popliteal veins. COMPARISON:  None. FINDINGS:  RIGHT LOWER EXTREMITY Common Femoral Vein: No evidence of thrombus. Normal compressibility, respiratory phasicity and response to augmentation. Saphenofemoral Junction: No evidence of thrombus. Normal compressibility and flow on color Doppler imaging. Profunda Femoral Vein: No evidence of thrombus. Normal compressibility and flow on color Doppler imaging. Femoral Vein: No evidence of thrombus. Normal compressibility, respiratory phasicity and response to augmentation. Popliteal Vein: No evidence of thrombus. Normal compressibility, respiratory phasicity and response to augmentation. Calf Veins: No evidence of thrombus. Normal compressibility and flow on color Doppler imaging. LEFT LOWER EXTREMITY Common Femoral Vein: No evidence of thrombus. Normal compressibility, respiratory phasicity and response to augmentation. Saphenofemoral Junction: No evidence of thrombus. Normal compressibility and flow on color Doppler imaging. Profunda Femoral Vein: No evidence of thrombus. Normal compressibility and flow on color Doppler imaging. Femoral Vein: No evidence of thrombus. Normal compressibility, respiratory phasicity and response to augmentation. Popliteal Vein: No evidence of thrombus. Normal compressibility, respiratory phasicity and response to augmentation. Calf Veins: No evidence of thrombus. Normal compressibility and flow on color Doppler imaging. IMPRESSION: No evidence of deep venous thrombosis in either lower extremity. Electronically Signed   By: Feliberto Harts MD   On: 01/09/2021 13:48   DG Chest Portable 1 View  Result Date: 01/09/2021 CLINICAL DATA:  Dizziness and shortness of breath EXAM: PORTABLE CHEST 1 VIEW COMPARISON:  09/20/2020 FINDINGS: Cardiac shadow is within normal limits. Aortic calcifications are again seen. Lungs are clear bilaterally. No bony abnormality is noted. IMPRESSION: No acute abnormality seen. Electronically Signed   By: Alcide Clever M.D.   On: 01/09/2021 00:14         Scheduled Meds:  amitriptyline  25 mg Oral QHS   vitamin C  500 mg Oral Daily   aspirin EC  81 mg Oral Daily   azithromycin  250 mg Oral Daily   cholestyramine  1 packet Oral Daily   citalopram  20 mg Oral Daily   clopidogrel  75 mg Oral Daily   enoxaparin (LOVENOX) injection  40 mg Subcutaneous Q24H   gabapentin  400 mg Oral TID   insulin aspart  0-5 Units Subcutaneous QHS   insulin aspart  0-9 Units Subcutaneous TID WC  ipratropium-albuterol  3 mL Nebulization Q4H   methylPREDNISolone (SOLU-MEDROL) injection  40 mg Intravenous Q12H   nicotine  21 mg Transdermal Daily   oxyCODONE  60 mg Oral TID   pantoprazole  40 mg Oral Daily   rosuvastatin  20 mg Oral QHS   Continuous Infusions:  cefTRIAXone (ROCEPHIN)  IV 1 g (01/10/21 1055)     LOS: 1 day    Time spent: 32 mins    Charise Killian, MD [facility] Pager [phone]  If 7PM-7AM, please contact night-coverage 01/10/2021, 3:47 PM

## 2021-01-10 NOTE — ED Notes (Signed)
Pt given warm bath wipes to clean self

## 2021-01-10 NOTE — Progress Notes (Signed)
SUBJECTIVE: Patient denies chest pain, shortness of breath, dizziness. Patient reports no more episodes of fast heart rate, palpitations.   Vitals:   01/10/21 0609 01/10/21 0700 01/10/21 0800 01/10/21 0845  BP:  106/65 (!) 113/54 102/70  Pulse: 66 97 71 67  Resp: 17 (!) 22 14   Temp:    97.7 F (36.5 C)  TempSrc:      SpO2: 94% 97% 90% 97%  Weight:      Height:        Intake/Output Summary (Last 24 hours) at 01/10/2021 0846 Last data filed at 01/09/2021 1202 Gross per 24 hour  Intake 600 ml  Output --  Net 600 ml    LABS: Basic Metabolic Panel: Recent Labs    01/08/21 2329 01/08/21 2351  NA 133*  --   K 3.5  --   CL 100  --   CO2 19*  --   GLUCOSE 184*  --   BUN 32*  --   CREATININE 2.25*  --   CALCIUM 9.4  --   MG  --  1.2*   Liver Function Tests: Recent Labs    01/08/21 2351  AST 24  ALT 26  ALKPHOS 115  BILITOT 0.8  PROT 7.1  ALBUMIN 4.1   No results for input(s): LIPASE, AMYLASE in the last 72 hours. CBC: Recent Labs    01/08/21 2329 01/10/21 0659  WBC 14.6* 9.8  HGB 14.3 11.2*  HCT 42.9 33.0*  MCV 96.8 96.8  PLT 275 209   Cardiac Enzymes: No results for input(s): CKTOTAL, CKMB, CKMBINDEX, TROPONINI in the last 72 hours. BNP: Invalid input(s): POCBNP D-Dimer: No results for input(s): DDIMER in the last 72 hours. Hemoglobin A1C: Recent Labs    01/09/21 1359  HGBA1C 6.9*   Fasting Lipid Panel: No results for input(s): CHOL, HDL, LDLCALC, TRIG, CHOLHDL, LDLDIRECT in the last 72 hours. Thyroid Function Tests: No results for input(s): TSH, T4TOTAL, T3FREE, THYROIDAB in the last 72 hours.  Invalid input(s): FREET3 Anemia Panel: No results for input(s): VITAMINB12, FOLATE, FERRITIN, TIBC, IRON, RETICCTPCT in the last 72 hours.   PHYSICAL EXAM General: Well developed, well nourished, in no acute distress HEENT:  Normocephalic and atramatic Neck:  No JVD.  Lungs: Clear bilaterally to auscultation and percussion. Heart: HRRR . Normal  S1 and S2 without gallops or murmurs.  Abdomen: Bowel sounds are positive, abdomen soft and non-tender  Msk:  Back normal, normal gait. Normal strength and tone for age. Extremities: No clubbing, cyanosis or edema.   Neuro: Alert and oriented X 3. Psych:  Good affect, responds appropriately  TELEMETRY: NSR, HR 68  ASSESSMENT AND PLAN: Echocardiogram completed in office 10/04/20, normal heart function. EF 61%. Stress test completed in office 10/04/20, equivocal with normal LVEF. Recommend CCTA as an outpatient with follow up in the office.  Principal Problem:   COPD exacerbation (HCC) Active Problems:   Essential hypertension   Type II diabetes mellitus with renal manifestations (HCC)   Elevated troponin   HLD (hyperlipidemia)   Sepsis (HCC)   Depression   Acute renal failure superimposed on stage 3a chronic kidney disease (HCC)   UTI (urinary tract infection)   New onset atrial fibrillation (HCC)   Diarrhea   Hypomagnesemia   Chronic pain syndrome    Stephanie Karis, FNP-C 01/10/2021 8:46 AM

## 2021-01-10 NOTE — Progress Notes (Signed)
Inpatient Diabetes Program Recommendations  AACE/ADA: New Consensus Statement on Inpatient Glycemic Control (2015)  Target Ranges:  Prepandial:   less than 140 mg/dL      Peak postprandial:   less than 180 mg/dL (1-2 hours)      Critically ill patients:  140 - 180 mg/dL   Results for Stephanie Small, Stephanie Small (MRN [id number]) as of 01/10/2021 09:38  Ref. Range 01/09/2021 13:24 01/09/2021 21:04 01/09/2021 23:39  Glucose-Capillary Latest Ref Range: 70 - 99 mg/dL 101 (H)  7 units NOVOLOG  271 (H)  5 units NOVOLOG  209 (H)  2 units NOVOLOG    Results for Stephanie Small, Stephanie Small (MRN [id number]) as of 01/10/2021 09:38  Ref. Range 01/10/2021 08:42  Glucose-Capillary Latest Ref Range: 70 - 99 mg/dL 778 (H)     Current Orders: Novolog 0-9 units TID ac/hs    MD- Note pt getting Solumedrol 40 mg BID  CBG 248 this AM  If pt will remain on Steroids today, please consider the following:  1. Start Lantus 9 units Daily (0.1 units/kg)  2. Increase Novolog SSI to the 0-15 unit scale     --Will follow patient during hospitalization--  Ambrose Finland RN, MSN, CDE Diabetes Coordinator Inpatient Glycemic Control Team Team Pager: [phone] (8a-5p)

## 2021-01-10 NOTE — ED Notes (Signed)
Pt walked to bathroom and back to room with steady gait

## 2021-01-11 DIAGNOSIS — N390 Urinary tract infection, site not specified: Secondary | ICD-10-CM | POA: Diagnosis not present

## 2021-01-11 DIAGNOSIS — I4891 Unspecified atrial fibrillation: Secondary | ICD-10-CM | POA: Diagnosis not present

## 2021-01-11 DIAGNOSIS — J441 Chronic obstructive pulmonary disease with (acute) exacerbation: Secondary | ICD-10-CM | POA: Diagnosis not present

## 2021-01-11 LAB — CBC
HCT: 35.5 % — ABNORMAL LOW (ref 36.0–46.0)
Hemoglobin: 11.5 g/dL — ABNORMAL LOW (ref 12.0–15.0)
MCH: 32.5 pg (ref 26.0–34.0)
MCHC: 32.4 g/dL (ref 30.0–36.0)
MCV: 100.3 fL — ABNORMAL HIGH (ref 80.0–100.0)
Platelets: 237 10*3/uL (ref 150–400)
RBC: 3.54 MIL/uL — ABNORMAL LOW (ref 3.87–5.11)
RDW: 13 % (ref 11.5–15.5)
WBC: 12 10*3/uL — ABNORMAL HIGH (ref 4.0–10.5)
nRBC: 0 % (ref 0.0–0.2)

## 2021-01-11 LAB — GLUCOSE, CAPILLARY: Glucose-Capillary: 294 mg/dL — ABNORMAL HIGH (ref 70–99)

## 2021-01-11 LAB — BASIC METABOLIC PANEL
Anion gap: 8 (ref 5–15)
BUN: 37 mg/dL — ABNORMAL HIGH (ref 8–23)
CO2: 22 mmol/L (ref 22–32)
Calcium: 8.8 mg/dL — ABNORMAL LOW (ref 8.9–10.3)
Chloride: 103 mmol/L (ref 98–111)
Creatinine, Ser: 1.29 mg/dL — ABNORMAL HIGH (ref 0.44–1.00)
GFR, Estimated: 44 mL/min — ABNORMAL LOW (ref >60)
Glucose, Bld: 294 mg/dL — ABNORMAL HIGH (ref 70–99)
Potassium: 5 mmol/L (ref 3.5–5.1)
Sodium: 133 mmol/L — ABNORMAL LOW (ref 135–145)

## 2021-01-11 MED ORDER — PREDNISONE 20 MG PO TABS: 40.0000 mg | ORAL_TABLET | Freq: Every day | ORAL | 0 refills | Status: AC

## 2021-01-11 MED ORDER — APIXABAN 5 MG PO TABS
5.0000 mg | ORAL_TABLET | Freq: Two times a day (BID) | ORAL | Status: DC
  Administered 2021-01-11: 5 mg via ORAL
  Filled 2021-01-11: qty 1

## 2021-01-11 MED ORDER — AMOXICILLIN-POT CLAVULANATE 875-125 MG PO TABS: 1.0000 | ORAL_TABLET | Freq: Two times a day (BID) | ORAL | 0 refills | Status: AC

## 2021-01-11 MED ORDER — AZITHROMYCIN 250 MG PO TABS: 250.0000 mg | ORAL_TABLET | Freq: Every day | ORAL | 0 refills | Status: AC

## 2021-01-11 MED ORDER — IPRATROPIUM-ALBUTEROL 0.5-2.5 (3) MG/3ML IN SOLN
3.0000 mL | Freq: Three times a day (TID) | RESPIRATORY_TRACT | Status: DC
  Filled 2021-01-11: qty 3

## 2021-01-11 MED ORDER — APIXABAN 5 MG PO TABS: 5.0000 mg | ORAL_TABLET | Freq: Two times a day (BID) | ORAL | 0 refills | Status: DC

## 2021-01-11 NOTE — Discharge Summary (Signed)
Physician Discharge Summary  Stephanie Small FIE:[id number] DOB: 1946/06/20 DOA: 01/08/2021  PCP: Lavera Guise, MD  Admit date: 01/08/2021 Discharge date: 01/11/2021  Admitted From: home  Disposition:  home  Recommendations for Outpatient Follow-up:  Follow up with PCP in 1-2 weeks F/u w/ cardio, Dr. Humphrey Rolls, on 01/15/21 at 9 AM F/u w/ pulmon, Dr. Lanney Gins in 1-2 weeks  Home Health: no  Equipment/Devices:  Discharge Condition: stable CODE STATUS: full  Diet recommendation: Heart Healthy / Carb Modified  Brief/Interim Summary: HPI was taken from Dr. Blaine Hamper: Stephanie Small is a 75 y.o. female with medical history significant of HTN, HLD, COPD, GERD, CKD-IIIa, essential tremor, goiter, polypharmacy, chronic pain syndrome, smokes e-cigarettes. who presents with shortness breath, diarrhea.   Patient states that she has shortness of breath, mild dry cough and wheezing in the past several days.  She does not have chest pain, fever or chills.  She also reports diarrhea, which has been going intermittently for almost a month.  She feels dizzy and lightheaded.  No unilateral numbness or tingling in extremities. No facial droop or slurred speech.  No nausea, vomiting or abdominal pain.  She denies symptoms of UTI.  She has mild bilateral lower leg edema, right leg is worse than the left.   Per report, pt was initially found to have new onset atrial fibrillation with heart rate up to 170s, which was spontaneously converted to sinus rhythm in ED. When I saw pt in ED, her heart rate is 70-90s.  No chest pain. Her initial blood pressure was 72/46, which improved to 90/73 after giving 1 L of LR bolus in ED.   ED Course: pt was found to have troponin level 11, 28, INR 1.1, WBC 14.6, urinalysis (cloudy appearance, moderate amount of leukocyte, rare bacteria, WBC 6-10), worsening renal function, temperature normal, heart rate 98, RR 20, oxygen sat 90-97% on room air.  Chest x-ray negative.  CT abdomen/pelvis  negative for acute intra-abdominal issues.  Patient is admitted to progressive bed as inpatient.  Dr. Humphrey Rolls of cardiology is consulted.   Hospital course from Dr. Jimmye Norman 7/14-7/15/22: Pt was found to have a COPD exacerbation and was treated w/ azithromycin, steroids, bronchodilators & incentive spirometry. Pt will f/u outpatient w/ pulmon, Dr. Lanney Gins. Of note, pt was found to have group B strep UTI and was treated w/ IV rocephin inpatient and switched to po augmentin at d/c to complete the course. Furthermore, pt was to have new onset a. fib w/ RVR and spontaneously converted back to sinus rhythm. Pt was started on eliquis (for CHADsVAsc score of at least 4), aspirin was d/c,  and pt was continued on home dose of plavix as per cardio. Pt did receive education/counseling on increase risk of bleeding with eliquis and plavix as well as the signs/symptoms of when to return to the ER. Pt understands no to take aspirin anymore and not to take NSAIDs ( including but not limited to ibuprofen, aleve, naproxen, naprosyn, voltaren). Pt verbalized her understanding.  Pt will f/u w/ cardio, Dr. Humphrey Rolls, on 01/15/21 at [place]. For more information, please see previous progress/consult notes.   Discharge Diagnoses:  Principal Problem:   COPD exacerbation ([place]) Active Problems:   Essential hypertension   Type II diabetes mellitus with renal manifestations (HCC)   Elevated troponin   HLD (hyperlipidemia)   Sepsis (HCC)   Depression   Acute renal failure superimposed on stage 3a chronic kidney disease (HCC)   UTI (urinary tract infection)  New onset atrial fibrillation (HCC)   Diarrhea   Hypomagnesemia   Chronic pain syndrome  COPD exacerbation: continue on azithromycin, steroids & bronchodilators. Encourage incentive spirometry. Mucinex for cough   UTI: UA was positive. Urine cx growing group B strep. Continue on IV rocephin while inpatient & switch to po augmentin at d/c    Sepsis due to UTI and COPD  exacerbation: meets criteria  w/ leukocytosis, tachycardia. Resolved   HTN: restart home dose lasix, losartan & imdur    DM2:  HbA1c 7.1, poorly controlled. Continue on SSI w/ accuchecks    Elevated troponin: likely secondary to demand ischemia    HLD: continue on statin    Depression: severity unknown. Continue on home dose of citalopram   AKI on CKDIIIa: Cr is labile. Baseline Cr 1.1-1.3. Likely due to multifactorial etiology, including UTI, continuation of Cozaar and Lasix, and possible ATN due to hypotension.  New onset atrial fibrillation: initially had new onset A. fib with RVR with heart rate up to 170, spontaneously converted to sinus rhythm. Continue on tele. CHADsVasc score at least 4, so started on eliquis and aspirin was d/c as per cardio    Diarrhea: resolved    Hypomagnesemia: resolved    Chronic pain syndrome: continue on home dose of oxycontin    Asymmetric leg edema: [place] of b/l LE neg for DVTs  Obesity: BMI 37.3. Complicates overall care and prognosis   Discharge Instructions  Discharge Instructions     Diet - low sodium heart healthy   Complete by: As directed    Diet Carb Modified   Complete by: As directed    Discharge instructions   Complete by: As directed    F/u w/ cardio, Dr. Humphrey Rolls, 01/15/21 at 9 AM. Do not take aspirin and NSAIDs (including but not limited to ibuprofen, naproxen, naprosyn, aleve, voltaren) while taking eliquis as that will increase your risk of bleeding. F/u w/ pulmon, Dr. Lanney Gins in 1-2 weeks. F/u w/ PCP in 1-2 weeks   Increase activity slowly   Complete by: As directed       Allergies as of 01/11/2021       Reactions   Shellfish Allergy Rash        Medication List     STOP taking these medications    aspirin 81 MG tablet   diclofenac sodium 1 % Gel Commonly known as: VOLTAREN   methocarbamol 500 MG tablet Commonly known as: ROBAXIN   pantoprazole 40 MG tablet Commonly known as: PROTONIX   polyethylene glycol  236 g solution Commonly known as: Golytely   pravastatin 40 MG tablet Commonly known as: PRAVACHOL       TAKE these medications    albuterol 108 (90 Base) MCG/ACT inhaler Commonly known as: VENTOLIN HFA Inhale into the lungs every 6 (six) hours as needed for wheezing or shortness of breath.   alendronate 70 MG tablet Commonly known as: FOSAMAX Take 70 mg by mouth once a week.   amitriptyline 25 MG tablet Commonly known as: ELAVIL Take 25 mg by mouth at bedtime.   amoxicillin-clavulanate 875-125 MG tablet Commonly known as: Augmentin Take 1 tablet by mouth 2 (two) times daily for 2 days.   apixaban 5 MG Tabs tablet Commonly known as: ELIQUIS Take 1 tablet (5 mg total) by mouth 2 (two) times daily.   azithromycin 250 MG tablet Commonly known as: ZITHROMAX Take 1 tablet (250 mg total) by mouth daily for 3 days.   cholestyramine 4 g packet Commonly  known as: QUESTRAN Take 1 packet by mouth daily.   citalopram 20 MG tablet Commonly known as: CELEXA Take 20 mg by mouth daily.   clopidogrel 75 MG tablet Commonly known as: PLAVIX Take 75 mg by mouth daily.   cyclobenzaprine 5 MG tablet Commonly known as: FLEXERIL Take 5 mg by mouth 3 (three) times daily as needed for muscle spasms.   furosemide 40 MG tablet Commonly known as: LASIX Take 40 mg by mouth daily.   gabapentin 400 MG capsule Commonly known as: NEURONTIN Take 400 mg by mouth 3 (three) times daily.   isosorbide mononitrate 30 MG 24 hr tablet Commonly known as: IMDUR Take 30 mg by mouth daily.   loperamide 2 MG capsule Commonly known as: IMODIUM Take 1 capsule (2 mg total) by mouth as needed for diarrhea or loose stools.   losartan 100 MG tablet Commonly known as: COZAAR Take 100 mg by mouth daily.   naloxone 4 MG/0.1ML Liqd nasal spray kit Commonly known as: NARCAN Place 1 spray into the nose once.   omeprazole 40 MG capsule Commonly known as: PRILOSEC Take 40 mg by mouth daily.    oxyCODONE 60 MG 12 hr tablet Commonly known as: OXYCONTIN Take by mouth 3 (three) times daily. What changed: Another medication with the same name was removed. Continue taking this medication, and follow the directions you see here.   predniSONE 20 MG tablet Commonly known as: DELTASONE Take 2 tablets (40 mg total) by mouth daily for 5 days.   rosuvastatin 20 MG tablet Commonly known as: CRESTOR Take 20 mg by mouth at bedtime.   SUMAtriptan 100 MG tablet Commonly known as: IMITREX Take 100 mg by mouth every 2 (two) hours as needed for migraine. May repeat in 2 hours if headache persists or recurs.   vitamin C 500 MG tablet Commonly known as: ASCORBIC ACID Take 500 mg by mouth daily.       ASK your doctor about these medications    fluticasone furoate-vilanterol 100-25 MCG/INH Aepb Commonly known as: BREO ELLIPTA Inhale 1 puff into the lungs daily.   Januvia 100 MG tablet Generic drug: sitaGLIPtin Take 100 mg by mouth daily.   saxagliptin HCl 2.5 MG Tabs tablet Commonly known as: ONGLYZA Take 2.5 mg by mouth daily.        Allergies  Allergen Reactions   Shellfish Allergy Rash    Consultations: cardio   Procedures/Studies: CT ABDOMEN PELVIS WO CONTRAST  Result Date: 01/09/2021 CLINICAL DATA:  Diarrhea and acute kidney injury EXAM: CT ABDOMEN AND PELVIS WITHOUT CONTRAST TECHNIQUE: Multidetector CT imaging of the abdomen and pelvis was performed following the standard protocol without IV contrast. COMPARISON:  None. FINDINGS: LOWER CHEST: Normal. HEPATOBILIARY: Normal hepatic contours. No intra- or extrahepatic biliary dilatation. Status post cholecystectomy. PANCREAS: Normal pancreas. No ductal dilatation or peripancreatic fluid collection. SPLEEN: Normal. ADRENALS/URINARY TRACT: The adrenal glands are normal. No hydronephrosis, nephroureterolithiasis or solid renal mass. 3.1 cm left renal cyst. The urinary bladder is normal for degree of distention STOMACH/BOWEL:  There is no hiatal hernia. Normal duodenal course and caliber. No small bowel dilatation or inflammation. No focal colonic abnormality. Normal appendix. VASCULAR/LYMPHATIC: There is calcific atherosclerosis of the abdominal aorta. No lymphadenopathy. REPRODUCTIVE: Normal uterus. No adnexal mass. MUSCULOSKELETAL. No bony spinal canal stenosis or focal osseous abnormality. OTHER: None. IMPRESSION: No acute abnormality of the abdomen or pelvis. Aortic atherosclerosis (ICD10-I70.0). Electronically Signed   By: Ulyses Jarred M.D.   On: 01/09/2021 02:38   US  Venous Img Lower Bilateral (DVT)  Result Date: 01/09/2021 CLINICAL DATA:  Asymmetric lower extremity edema. EXAM: BILATERAL LOWER EXTREMITY VENOUS DOPPLER ULTRASOUND TECHNIQUE: Gray-scale sonography with graded compression, as well as color Doppler and duplex ultrasound were performed to evaluate the lower extremity deep venous systems from the level of the common femoral vein and including the common femoral, femoral, profunda femoral, popliteal and calf veins including the posterior tibial, peroneal and gastrocnemius veins when visible. The superficial great saphenous vein was also interrogated. Spectral Doppler was utilized to evaluate flow at rest and with distal augmentation maneuvers in the common femoral, femoral and popliteal veins. COMPARISON:  None. FINDINGS: RIGHT LOWER EXTREMITY Common Femoral Vein: No evidence of thrombus. Normal compressibility, respiratory phasicity and response to augmentation. Saphenofemoral Junction: No evidence of thrombus. Normal compressibility and flow on color Doppler imaging. Profunda Femoral Vein: No evidence of thrombus. Normal compressibility and flow on color Doppler imaging. Femoral Vein: No evidence of thrombus. Normal compressibility, respiratory phasicity and response to augmentation. Popliteal Vein: No evidence of thrombus. Normal compressibility, respiratory phasicity and response to augmentation. Calf Veins: No  evidence of thrombus. Normal compressibility and flow on color Doppler imaging. LEFT LOWER EXTREMITY Common Femoral Vein: No evidence of thrombus. Normal compressibility, respiratory phasicity and response to augmentation. Saphenofemoral Junction: No evidence of thrombus. Normal compressibility and flow on color Doppler imaging. Profunda Femoral Vein: No evidence of thrombus. Normal compressibility and flow on color Doppler imaging. Femoral Vein: No evidence of thrombus. Normal compressibility, respiratory phasicity and response to augmentation. Popliteal Vein: No evidence of thrombus. Normal compressibility, respiratory phasicity and response to augmentation. Calf Veins: No evidence of thrombus. Normal compressibility and flow on color Doppler imaging. IMPRESSION: No evidence of deep venous thrombosis in either lower extremity. Electronically Signed   By: Margaretha Sheffield MD   On: 01/09/2021 13:48   DG Chest Portable 1 View  Result Date: 01/09/2021 CLINICAL DATA:  Dizziness and shortness of breath EXAM: PORTABLE CHEST 1 VIEW COMPARISON:  09/20/2020 FINDINGS: Cardiac shadow is within normal limits. Aortic calcifications are again seen. Lungs are clear bilaterally. No bony abnormality is noted. IMPRESSION: No acute abnormality seen. Electronically Signed   By: Inez Catalina M.D.   On: 01/09/2021 00:14   (Echo, Carotid, EGD, Colonoscopy, ERCP)    Subjective: pt c/o fatigue    Discharge Exam: Vitals:   01/11/21 0845 01/11/21 1059  BP: 129/60   Pulse: 83   Resp:    Temp: 97.6 F (36.4 C)   SpO2: 94% 95%   Vitals:   01/11/21 0423 01/11/21 0821 01/11/21 0845 01/11/21 1059  BP:   129/60   Pulse:   83   Resp:      Temp:   97.6 F (36.4 C)   TempSrc:      SpO2: 93% 94% 94% 95%  Weight:      Height:        General: Pt is alert, awake, not in acute distress Cardiovascular: S1/S2 +, no rubs, no gallops Respiratory: diminished breath sounds  Abdominal: Soft, NT, obese, bowel sounds  + Extremities:  no cyanosis    The results of significant diagnostics from this hospitalization (including imaging, microbiology, ancillary and laboratory) are listed below for reference.     Microbiology: Recent Results (from the past 240 hour(s))  Resp Panel by RT-PCR (Flu A&B, Covid) Nasopharyngeal Swab     Status: None   Collection Time: 01/08/21 11:51 PM   Specimen: Nasopharyngeal Swab; Nasopharyngeal(NP) swabs in vial transport medium  Result Value Ref Range Status   SARS Coronavirus 2 by RT PCR NEGATIVE NEGATIVE Final    Comment: (NOTE) SARS-CoV-2 target nucleic acids are NOT DETECTED.  The SARS-CoV-2 RNA is generally detectable in upper respiratory specimens during the acute phase of infection. The lowest concentration of SARS-CoV-2 viral copies this assay can detect is 138 copies/mL. A negative result does not preclude SARS-Cov-2 infection and should not be used as the sole basis for treatment or other patient management decisions. A negative result may occur with  improper specimen collection/handling, submission of specimen other than nasopharyngeal swab, presence of viral mutation(s) within the areas targeted by this assay, and inadequate number of viral copies(<138 copies/mL). A negative result must be combined with clinical observations, patient history, and epidemiological information. The expected result is Negative.  Fact Sheet for Patients:  EntrepreneurPulse.com.au  Fact Sheet for Healthcare Providers:  IncredibleEmployment.be  This test is no t yet approved or cleared by the [place] FDA and  has been authorized for detection and/or diagnosis of SARS-CoV-2 by FDA under an Emergency Use Authorization (EUA). This EUA will remain  in effect (meaning this test can be used) for the duration of the COVID-19 declaration under Section 564(b)(1) of the Act, 21 U.S.C.section 360bbb-3(b)(1), unless the authorization is  terminated  or revoked sooner.       Influenza A by PCR NEGATIVE NEGATIVE Final   Influenza B by PCR NEGATIVE NEGATIVE Final    Comment: (NOTE) The Xpert Xpress SARS-CoV-2/FLU/RSV plus assay is intended as an aid in the diagnosis of influenza from Nasopharyngeal swab specimens and should not be used as a sole basis for treatment. Nasal washings and aspirates are unacceptable for Xpert Xpress SARS-CoV-2/FLU/RSV testing.  Fact Sheet for Patients: EntrepreneurPulse.com.au  Fact Sheet for Healthcare Providers: IncredibleEmployment.be  This test is not yet approved or cleared by the [place] FDA and has been authorized for detection and/or diagnosis of SARS-CoV-2 by FDA under an Emergency Use Authorization (EUA). This EUA will remain in effect (meaning this test can be used) for the duration of the COVID-19 declaration under Section 564(b)(1) of the Act, 21 U.S.C. section 360bbb-3(b)(1), unless the authorization is terminated or revoked.  Performed at [facility], [address] [place], [place] [address]   Urine Culture     Status: Abnormal   Collection Time: 01/09/21  2:30 AM   Specimen: Urine, Random  Result Value Ref Range Status   Specimen Description   Final    URINE, RANDOM Performed at [facility], [address], [place], [place] [address]    Special Requests   Final    NONE Performed at [facility], [address] [place], [place] [address]    Culture (A)  Final    50,000 COLONIES/mL GROUP B STREP(S.AGALACTIAE)ISOLATED TESTING AGAINST S. AGALACTIAE NOT ROUTINELY PERFORMED DUE TO PREDICTABILITY OF AMP/PEN/VAN SUSCEPTIBILITY. Performed at [facility], [place] [address], [place], [place] [address]    Report Status 01/10/2021 FINAL  Final  Culture, blood (x 2)     Status: None (Preliminary result)   Collection Time: 01/09/21  2:00 PM   Specimen: BLOOD  Result Value Ref Range Status    Specimen Description BLOOD BLOOD RIGHT HAND  Final   Special Requests   Final    BOTTLES DRAWN AEROBIC AND ANAEROBIC Blood Culture adequate volume   Culture   Final    NO GROWTH < 24 HOURS Performed at [facility], [place],  [place] [address]    Report Status PENDING  Incomplete  Culture, blood (x 2)     Status: None (Preliminary result)   Collection Time: 01/09/21  2:01 PM   Specimen: BLOOD  Result Value Ref Range Status   Specimen Description BLOOD BLOOD LEFT FOREARM  Final   Special Requests   Final    BOTTLES DRAWN AEROBIC AND ANAEROBIC Blood Culture adequate volume   Culture   Final    NO GROWTH < 24 HOURS Performed at [facility], [address] [place], [place] [address]    Report Status PENDING  Incomplete     Labs: BNP (last 3 results) Recent Labs    01/09/21 1359  BNP 62.1   Basic Metabolic Panel: Recent Labs  Lab 01/08/21 2329 01/08/21 2351 01/10/21 0659 01/11/21 0457  NA 133*  --  135 133*  K 3.5  --  5.0 5.0  CL 100  --  106 103  CO2 19*  --  23 22  GLUCOSE 184*  --  244* 294*  BUN 32*  --  30* 37*  CREATININE 2.25*  --  1.12* 1.29*  CALCIUM 9.4  --  8.9 8.8*  MG  --  1.2* 1.7  --    Liver Function Tests: Recent Labs  Lab 01/08/21 2351  AST 24  ALT 26  ALKPHOS 115  BILITOT 0.8  PROT 7.1  ALBUMIN 4.1   No results for input(s): LIPASE, AMYLASE in the last 168 hours. No results for input(s): AMMONIA in the last 168 hours. CBC: Recent Labs  Lab 01/08/21 2329 01/10/21 0659 01/11/21 0457  WBC 14.6* 9.8 12.0*  HGB 14.3 11.2* 11.5*  HCT 42.9 33.0* 35.5*  MCV 96.8 96.8 100.3*  PLT 275 209 237   Cardiac Enzymes: No results for input(s): CKTOTAL, CKMB, CKMBINDEX, TROPONINI in the last 168 hours. BNP: Invalid input(s): POCBNP CBG: Recent Labs  Lab 01/10/21 0842 01/10/21 1231 01/10/21 1709 01/10/21 2107 01/11/21 0843  GLUCAP 248* 260* 343* 107* 294*   D-Dimer No results for input(s): DDIMER in  the last 72 hours. Hgb A1c Recent Labs    01/09/21 1359  HGBA1C 6.9*   Lipid Profile Recent Labs    01/10/21 0659  CHOL 107  HDL 43  LDLCALC 51  TRIG 66  CHOLHDL 2.5   Thyroid function studies Recent Labs    01/10/21 0659  TSH 0.077*   Anemia work up No results for input(s): VITAMINB12, FOLATE, FERRITIN, TIBC, IRON, RETICCTPCT in the last 72 hours. Urinalysis    Component Value Date/Time   COLORURINE AMBER (A) 01/08/2021 2351   APPEARANCEUR CLOUDY (A) 01/08/2021 2351   LABSPEC 1.006 01/08/2021 2351   PHURINE 5.0 01/08/2021 2351   GLUCOSEU NEGATIVE 01/08/2021 2351   HGBUR NEGATIVE 01/08/2021 2351   BILIRUBINUR NEGATIVE 01/08/2021 2351   KETONESUR NEGATIVE 01/08/2021 2351   PROTEINUR NEGATIVE 01/08/2021 2351   NITRITE NEGATIVE 01/08/2021 2351   LEUKOCYTESUR MODERATE (A) 01/08/2021 2351   Sepsis Labs Invalid input(s): PROCALCITONIN,  WBC,  LACTICIDVEN Microbiology Recent Results (from the past 240 hour(s))  Resp Panel by RT-PCR (Flu A&B, Covid) Nasopharyngeal Swab     Status: None   Collection Time: 01/08/21 11:51 PM   Specimen: Nasopharyngeal Swab; Nasopharyngeal(NP) swabs in vial transport medium  Result Value Ref Range Status   SARS Coronavirus 2 by RT PCR NEGATIVE NEGATIVE Final    Comment: (NOTE) SARS-CoV-2 target nucleic acids are NOT DETECTED.  The SARS-CoV-2 RNA is generally detectable in upper respiratory  specimens during the acute phase of infection. The lowest concentration of SARS-CoV-2 viral copies this assay can detect is 138 copies/mL. A negative result does not preclude SARS-Cov-2 infection and should not be used as the sole basis for treatment or other patient management decisions. A negative result may occur with  improper specimen collection/handling, submission of specimen other than nasopharyngeal swab, presence of viral mutation(s) within the areas targeted by this assay, and inadequate number of viral copies(<138 copies/mL). A negative  result must be combined with clinical observations, patient history, and epidemiological information. The expected result is Negative.  Fact Sheet for Patients:  EntrepreneurPulse.com.au  Fact Sheet for Healthcare Providers:  IncredibleEmployment.be  This test is no t yet approved or cleared by the [place] FDA and  has been authorized for detection and/or diagnosis of SARS-CoV-2 by FDA under an Emergency Use Authorization (EUA). This EUA will remain  in effect (meaning this test can be used) for the duration of the COVID-19 declaration under Section 564(b)(1) of the Act, 21 U.S.C.section 360bbb-3(b)(1), unless the authorization is terminated  or revoked sooner.       Influenza A by PCR NEGATIVE NEGATIVE Final   Influenza B by PCR NEGATIVE NEGATIVE Final    Comment: (NOTE) The Xpert Xpress SARS-CoV-2/FLU/RSV plus assay is intended as an aid in the diagnosis of influenza from Nasopharyngeal swab specimens and should not be used as a sole basis for treatment. Nasal washings and aspirates are unacceptable for Xpert Xpress SARS-CoV-2/FLU/RSV testing.  Fact Sheet for Patients: EntrepreneurPulse.com.au  Fact Sheet for Healthcare Providers: IncredibleEmployment.be  This test is not yet approved or cleared by the [place] FDA and has been authorized for detection and/or diagnosis of SARS-CoV-2 by FDA under an Emergency Use Authorization (EUA). This EUA will remain in effect (meaning this test can be used) for the duration of the COVID-19 declaration under Section 564(b)(1) of the Act, 21 U.S.C. section 360bbb-3(b)(1), unless the authorization is terminated or revoked.  Performed at [facility], [address] [place], [place] [address]   Urine Culture     Status: Abnormal   Collection Time: 01/09/21  2:30 AM   Specimen: Urine, Random  Result Value Ref Range Status   Specimen  Description   Final    URINE, RANDOM Performed at [facility], [address], [place], [place] [address]    Special Requests   Final    NONE Performed at [facility], [address] [place], [place] [address]    Culture (A)  Final    50,000 COLONIES/mL GROUP B STREP(S.AGALACTIAE)ISOLATED TESTING AGAINST S. AGALACTIAE NOT ROUTINELY PERFORMED DUE TO PREDICTABILITY OF AMP/PEN/VAN SUSCEPTIBILITY. Performed at [facility], [place] [address] [place], [place] [address]    Report Status 01/10/2021 FINAL  Final  Culture, blood (x 2)     Status: None (Preliminary result)   Collection Time: 01/09/21  2:00 PM   Specimen: BLOOD  Result Value Ref Range Status   Specimen Description BLOOD BLOOD RIGHT HAND  Final   Special Requests   Final    BOTTLES DRAWN AEROBIC AND ANAEROBIC Blood Culture adequate volume   Culture   Final    NO GROWTH < 24 HOURS Performed at [facility], [address] [place], [place] [address]    Report Status PENDING  Incomplete  Culture, blood (x 2)     Status: None (Preliminary result)   Collection Time: 01/09/21  2:01 PM   Specimen: BLOOD  Result Value  Ref Range Status   Specimen Description BLOOD BLOOD LEFT FOREARM  Final   Special Requests   Final    BOTTLES DRAWN AEROBIC AND ANAEROBIC Blood Culture adequate volume   Culture   Final    NO GROWTH < 24 HOURS Performed at [facility], [address], [place], [place] [address]    Report Status PENDING  Incomplete     Time coordinating discharge: Over 30 minutes  SIGNED:   Wyvonnia Dusky, MD  [facility] 01/11/2021, 11:55 AM Pager   If 7PM-7AM, please contact night-coverage

## 2021-01-11 NOTE — Progress Notes (Signed)
Pt scored a 2 on the RT protocol assessment. The nebulizer treatments are to be changed to TID.

## 2021-01-11 NOTE — Progress Notes (Signed)
Inpatient Diabetes Program Recommendations  AACE/ADA: New Consensus Statement on Inpatient Glycemic Control (2015)  Target Ranges:  Prepandial:   less than 140 mg/dL      Peak postprandial:   less than 180 mg/dL (1-2 hours)      Critically ill patients:  140 - 180 mg/dL   Results for LAURISA, SAHAKIAN (MRN [id number]) as of 01/11/2021 09:31  Ref. Range 01/10/2021 08:42 01/10/2021 12:31 01/10/2021 17:09 01/10/2021 21:07  Glucose-Capillary Latest Ref Range: 70 - 99 mg/dL 665 (H)  3 units NOVOLOG @10 :31  260 (H)  5 units NOVOLOG  343 (H)  7 units NOVOLOG  107 (H)  Results for KRISTIANN, NOYCE (MRN Nadara Mustard) as of 01/11/2021 09:31  Ref. Range 01/11/2021 08:43  Glucose-Capillary Latest Ref Range: 70 - 99 mg/dL 01/13/2021 (H)  5 units NOVOLOG     Current Orders: Novolog 0-9 units TID ac/hs       MD- Note pt getting Solumedrol 40 mg BID   CBG 294 this AM   If pt will remain on Steroids today, please consider the following:   1. Start Lantus 9 units Daily (0.1 units/kg)   2. Increase Novolog SSI to the 0-15 unit scale    --Will follow patient during hospitalization--  [phone] RN, MSN, CDE Diabetes Coordinator Inpatient Glycemic Control Team Team Pager: [phone] (8a-5p)

## 2021-01-11 NOTE — Progress Notes (Signed)
SUBJECTIVE: Patient denies chest pain, shortness of breath, dizziness. Patient reports no more episodes of fast heart rate, palpitations.   Vitals:   01/10/21 2034 01/10/21 2252 01/11/21 0416 01/11/21 0423  BP:  (!) 105/54 (!) 98/57   Pulse:  65 67   Resp:  17 20   Temp:  98.2 F (36.8 C) 98.6 F (37 C)   TempSrc:   Oral   SpO2: 93% 96% 95% 93%  Weight:      Height:        Intake/Output Summary (Last 24 hours) at 01/11/2021 0843 Last data filed at 01/10/2021 2000 Gross per 24 hour  Intake 240 ml  Output --  Net 240 ml    LABS: Basic Metabolic Panel: Recent Labs    01/08/21 2351 01/10/21 0659 01/11/21 0457  NA  --  135 133*  K  --  5.0 5.0  CL  --  106 103  CO2  --  23 22  GLUCOSE  --  244* 294*  BUN  --  30* 37*  CREATININE  --  1.12* 1.29*  CALCIUM  --  8.9 8.8*  MG 1.2* 1.7  --    Liver Function Tests: Recent Labs    01/08/21 2351  AST 24  ALT 26  ALKPHOS 115  BILITOT 0.8  PROT 7.1  ALBUMIN 4.1   No results for input(s): LIPASE, AMYLASE in the last 72 hours. CBC: Recent Labs    01/10/21 0659 01/11/21 0457  WBC 9.8 12.0*  HGB 11.2* 11.5*  HCT 33.0* 35.5*  MCV 96.8 100.3*  PLT 209 237   Cardiac Enzymes: No results for input(s): CKTOTAL, CKMB, CKMBINDEX, TROPONINI in the last 72 hours. BNP: Invalid input(s): POCBNP D-Dimer: No results for input(s): DDIMER in the last 72 hours. Hemoglobin A1C: Recent Labs    01/09/21 1359  HGBA1C 6.9*   Fasting Lipid Panel: Recent Labs    01/10/21 0659  CHOL 107  HDL 43  LDLCALC 51  TRIG 66  CHOLHDL 2.5   Thyroid Function Tests: Recent Labs    01/10/21 0659  TSH 0.077*   Anemia Panel: No results for input(s): VITAMINB12, FOLATE, FERRITIN, TIBC, IRON, RETICCTPCT in the last 72 hours.   PHYSICAL EXAM General: Well developed, well nourished, in no acute distress HEENT:  Normocephalic and atramatic Neck:  No JVD.  Lungs: Clear bilaterally to auscultation and percussion. Heart: HRRR . Normal  S1 and S2 without gallops or murmurs.  Abdomen: Bowel sounds are positive, abdomen soft and non-tender  Msk:  Back normal, normal gait. Normal strength and tone for age. Extremities: No clubbing, cyanosis or edema.   Neuro: Alert and oriented X 3. Psych:  Good affect, responds appropriately  TELEMETRY: NSR, HR 68  ASSESSMENT AND PLAN: Patient feeling better. Can be discharged home with follow up in office Tuesday, 01/15/21 at 9:00 am.  Principal Problem:   COPD exacerbation (HCC) Active Problems:   Essential hypertension   Type II diabetes mellitus with renal manifestations (HCC)   Elevated troponin   HLD (hyperlipidemia)   Sepsis (HCC)   Depression   Acute renal failure superimposed on stage 3a chronic kidney disease (HCC)   UTI (urinary tract infection)   New onset atrial fibrillation (HCC)   Diarrhea   Hypomagnesemia   Chronic pain syndrome    Jaasia Viglione, FNP-C 01/11/2021 8:43 AM

## 2021-01-11 NOTE — Care Management Important Message (Signed)
Important Message  Patient Details  Name: Stephanie Small MRN: [id number] Date of Birth: 07/31/1945   Medicare Important Message Given:  Yes     Johnell Comings 01/11/2021, 11:53 AM

## 2021-01-13 ENCOUNTER — Encounter: Payer: Self-pay | Admitting: Emergency Medicine

## 2021-01-13 ENCOUNTER — Other Ambulatory Visit: Payer: Self-pay

## 2021-01-13 ENCOUNTER — Emergency Department
Admission: EM | Admit: 2021-01-13 | Discharge: 2021-01-14 | Disposition: A | Discharge status: Home / Self Care | Payer: Medicare Other | Attending: Emergency Medicine | Admitting: Emergency Medicine

## 2021-01-13 ENCOUNTER — Emergency Department: Payer: Medicare Other

## 2021-01-13 DIAGNOSIS — F17290 Nicotine dependence, other tobacco product, uncomplicated: Secondary | ICD-10-CM | POA: Insufficient documentation

## 2021-01-13 DIAGNOSIS — Z79899 Other long term (current) drug therapy: Secondary | ICD-10-CM | POA: Diagnosis not present

## 2021-01-13 DIAGNOSIS — J449 Chronic obstructive pulmonary disease, unspecified: Secondary | ICD-10-CM | POA: Insufficient documentation

## 2021-01-13 DIAGNOSIS — R404 Transient alteration of awareness: Secondary | ICD-10-CM | POA: Diagnosis not present

## 2021-01-13 DIAGNOSIS — N1831 Chronic kidney disease, stage 3a: Secondary | ICD-10-CM | POA: Insufficient documentation

## 2021-01-13 DIAGNOSIS — Z7902 Long term (current) use of antithrombotics/antiplatelets: Secondary | ICD-10-CM | POA: Insufficient documentation

## 2021-01-13 DIAGNOSIS — R55 Syncope and collapse: Secondary | ICD-10-CM | POA: Diagnosis present

## 2021-01-13 DIAGNOSIS — I129 Hypertensive chronic kidney disease with stage 1 through stage 4 chronic kidney disease, or unspecified chronic kidney disease: Secondary | ICD-10-CM | POA: Diagnosis not present

## 2021-01-13 DIAGNOSIS — Z7984 Long term (current) use of oral hypoglycemic drugs: Secondary | ICD-10-CM | POA: Insufficient documentation

## 2021-01-13 DIAGNOSIS — Z7901 Long term (current) use of anticoagulants: Secondary | ICD-10-CM | POA: Insufficient documentation

## 2021-01-13 LAB — CBC
HCT: 39.4 % (ref 36.0–46.0)
Hemoglobin: 13.3 g/dL (ref 12.0–15.0)
MCH: 31.9 pg (ref 26.0–34.0)
MCHC: 33.8 g/dL (ref 30.0–36.0)
MCV: 94.5 fL (ref 80.0–100.0)
Platelets: 284 10*3/uL (ref 150–400)
RBC: 4.17 MIL/uL (ref 3.87–5.11)
RDW: 12.7 % (ref 11.5–15.5)
WBC: 11.8 10*3/uL — ABNORMAL HIGH (ref 4.0–10.5)
nRBC: 0 % (ref 0.0–0.2)

## 2021-01-13 LAB — BASIC METABOLIC PANEL
Anion gap: 9 (ref 5–15)
BUN: 28 mg/dL — ABNORMAL HIGH (ref 8–23)
CO2: 25 mmol/L (ref 22–32)
Calcium: 8.9 mg/dL (ref 8.9–10.3)
Chloride: 101 mmol/L (ref 98–111)
Creatinine, Ser: 1.14 mg/dL — ABNORMAL HIGH (ref 0.44–1.00)
GFR, Estimated: 51 mL/min — ABNORMAL LOW (ref >60)
Glucose, Bld: 200 mg/dL — ABNORMAL HIGH (ref 70–99)
Potassium: 3.6 mmol/L (ref 3.5–5.1)
Sodium: 135 mmol/L (ref 135–145)

## 2021-01-13 MED ORDER — SODIUM CHLORIDE 0.9 % IV BOLUS
500.0000 mL | Freq: Once | INTRAVENOUS | Status: AC
  Administered 2021-01-13: 500 mL via INTRAVENOUS

## 2021-01-13 NOTE — ED Triage Notes (Signed)
Pt in via EMS from home with c/o hypotension EMS reports first BP was 80's systolic. 2nd BP was 61/48. #18 g to right FA, negative stroke screen for EMS.

## 2021-01-13 NOTE — ED Triage Notes (Signed)
Pt recently admitted 7/13-7/15 for COPD exacerbation. Pt also c/o HA and nausea today. FSBS 234, 127/94 BP per EMS. Pt was placed on 2L of O2.   Pt reports she went into the kitchen and all the sudden she got weak and her legs gave but she didn't fall. Pt also reports a feeling of something stuck in her throat. Pt denies SOB or pain

## 2021-01-14 LAB — TROPONIN I (HIGH SENSITIVITY): Troponin I (High Sensitivity): 8 ng/L (ref <18)

## 2021-01-14 LAB — CULTURE, BLOOD (ROUTINE X 2)
Culture: NO GROWTH
Culture: NO GROWTH
Special Requests: ADEQUATE
Special Requests: ADEQUATE

## 2021-01-14 NOTE — Discharge Instructions (Addendum)
Your workup in the Emergency Department today was reassuring.  We did not find any specific abnormalities.  We recommend you drink plenty of fluids, take your regular medications and/or any new ones prescribed today, and follow up with the doctor(s) listed in these documents as recommended.  Return to the Emergency Department if you develop new or worsening symptoms that concern you.  

## 2021-01-14 NOTE — ED Provider Notes (Signed)
[facility] Emergency Department Provider Note  ____________________________________________   Event Date/Time   First MD Initiated Contact with Patient 01/13/21 2300     (approximate)  I have reviewed the triage vital signs and the nursing notes.   HISTORY  Chief Complaint Hypotension    HPI Stephanie MustardMary H Flessner is a 75 y.o. female with medical history as listed below which notably includes COPD without any oxygen dependence who presents tonight by EMS after a near syncopal episode.  She said that she was walking from the bathroom and then felt like she was going to pass out.  She had to hold onto the furniture and be assisted by a family member.  Her daughter states she was not acting herself afterwards and seemed confused.  Her blood pressure was low initially but came out when she got to the emergency department.  She Denies chest pain or shortness of breath.  She denies chest pain.  She and her daughter say that this is not happened before.  She had no focal numbness nor weakness in her extremities but just felt generally weak all over and she says she has not felt right for a while.  She has had some vomiting but is not currently nauseated.  Her daughter was concerned about blood in the vomit.  Of note, the medical record indicates, and confirmed by the patient and family , that she was admitted to the hospital a few days ago for a similar episode thought to be from a COPD exacerbation but she was also hypertensive at the time with near syncope.  She was found to have paroxysmal atrial fibrillation and was started on Eliquis.       Past Medical History:  Diagnosis Date   Arthritis    COPD (chronic obstructive pulmonary disease) (HCC)    Diabetes mellitus without complication (HCC)    Glaucoma 2010   Hypertension 2005   Personal history of colonic polyps    Thyroid cyst     Patient Active Problem List   Diagnosis Date Noted   COPD exacerbation  (HCC) 01/09/2021   HLD (hyperlipidemia) 01/09/2021   Sepsis (HCC) 01/09/2021   GERD (gastroesophageal reflux disease) 01/09/2021   Depression 01/09/2021   Acute renal failure superimposed on stage 3a chronic kidney disease (HCC) 01/09/2021   UTI (urinary tract infection) 01/09/2021   New onset atrial fibrillation (HCC) 01/09/2021   Diarrhea 01/09/2021   Hypomagnesemia 01/09/2021   Chronic pain syndrome 01/09/2021   Elevated troponin    Chest pain 09/20/2020   Dilated cbd, acquired 03/18/2017   Pain medication agreement signed 03/05/2017   Atypical squamous cells of undetermined significance (ASCUS) on Papanicolaou smear of cervix 10/08/2016   History of adenomatous polyp of colon    Benign essential tremor 12/13/2015   Essential hypertension 10/25/2015   Goiter 10/25/2015   Respiratory distress 10/25/2015   Thyroid cyst 10/25/2015   Type II diabetes mellitus with renal manifestations (HCC) 10/25/2015   Wheezing 10/25/2015   COPD with hypoxia (HCC) 10/23/2015   Gastroesophageal reflux disease with esophagitis 01/18/2015   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   Myofascial muscle pain 12/14/2014   DDD (degenerative disc disease), cervical 07/31/2014   DDD (degenerative disc disease), lumbar 07/31/2014   Polypharmacy 05/01/2014   Neck pain 10/07/2012   Other chronic pain 10/07/2012   Degenerative disk disease 10/07/2012    Past Surgical History:  Procedure Laterality Date   CAROTID STENT INSERTION  2011   CHOLECYSTECTOMY  2009   COLONOSCOPY  2007   COLONOSCOPY WITH PROPOFOL N/A 08/18/2016   Procedure: COLONOSCOPY WITH PROPOFOL;  Surgeon: Midge Minium, MD;  Location: [facility];  Service: Endoscopy;  Laterality: N/A;   FOOT SURGERY     SHOULDER SURGERY Bilateral 2004   UPPER GI ENDOSCOPY      Prior to Admission medications   Medication Sig Start Date End Date Taking? Authorizing Provider  albuterol (PROVENTIL HFA;VENTOLIN HFA) 108 (90 Base) MCG/ACT inhaler Inhale into the  lungs every 6 (six) hours as needed for wheezing or shortness of breath.   Yes [provider]  alendronate (FOSAMAX) 70 MG tablet Take 70 mg by mouth once a week. 11/28/20  Yes [provider]  amitriptyline (ELAVIL) 25 MG tablet Take 25 mg by mouth at bedtime. 10/31/20  Yes [provider]  apixaban (ELIQUIS) 5 MG TABS tablet Take 1 tablet (5 mg total) by mouth 2 (two) times daily. 01/11/21 02/10/21 Yes Charise Killian, MD  azithromycin (ZITHROMAX) 250 MG tablet Take 1 tablet (250 mg total) by mouth daily for 3 days. 01/11/21 01/14/21 Yes Charise Killian, MD  cholestyramine Lanetta Inch) 4 g packet Take 1 packet by mouth daily. 01/03/21  Yes [provider]  citalopram (CELEXA) 20 MG tablet Take 20 mg by mouth daily.   Yes [provider]  clopidogrel (PLAVIX) 75 MG tablet Take 75 mg by mouth daily.   Yes [provider]  cyclobenzaprine (FLEXERIL) 5 MG tablet Take 5 mg by mouth 3 (three) times daily as needed for muscle spasms. 11/23/20  Yes [provider]  furosemide (LASIX) 40 MG tablet Take 40 mg by mouth daily. 12/24/20  Yes [provider]  gabapentin (NEURONTIN) 400 MG capsule Take 400 mg by mouth 3 (three) times daily.   Yes [provider]  isosorbide mononitrate (IMDUR) 30 MG 24 hr tablet Take 30 mg by mouth daily.   Yes [provider]  JANUVIA 100 MG tablet Take 100 mg by mouth daily. 11/27/20  Yes [provider]  loperamide (IMODIUM) 2 MG capsule Take 1 capsule (2 mg total) by mouth as needed for diarrhea or loose stools. 09/21/20  Yes Arnetha Courser, MD  losartan (COZAAR) 100 MG tablet Take 100 mg by mouth daily. 11/27/20  Yes [provider]  omeprazole (PRILOSEC) 40 MG capsule Take 40 mg by mouth daily. 12/25/20  Yes [provider]  oxyCODONE (OXYCONTIN) 60 MG 12 hr tablet Take by mouth 3 (three) times daily.   Yes [provider]  predniSONE (DELTASONE) 20 MG tablet  Take 2 tablets (40 mg total) by mouth daily for 5 days. 01/11/21 01/16/21 Yes Charise Killian, MD  rosuvastatin (CRESTOR) 20 MG tablet Take 20 mg by mouth at bedtime. 10/31/20  Yes [provider]  vitamin C (ASCORBIC ACID) 500 MG tablet Take 500 mg by mouth daily.   Yes [provider]  fluticasone furoate-vilanterol (BREO ELLIPTA) 100-25 MCG/INH AEPB Inhale 1 puff into the lungs daily. Patient not taking: No sig reported    [provider]  naloxone (NARCAN) nasal spray 4 mg/0.1 mL Place 1 spray into the nose once.    [provider]  saxagliptin HCl (ONGLYZA) 2.5 MG TABS tablet Take 2.5 mg by mouth daily. Patient not taking: No sig reported    [provider]  SUMAtriptan (IMITREX) 100 MG tablet Take 100 mg by mouth every 2 (two) hours as needed for migraine. May repeat in 2 hours if headache persists or recurs.  [provider]    Allergies Shellfish allergy  Family History  Problem Relation Age of Onset   Hypertension Mother     Social History Social History   Tobacco Use   Smoking status: Every Day    Types: E-cigarettes   Smokeless tobacco: Never  Substance Use Topics   Alcohol use: No   Drug use: No    Review of Systems Constitutional: No fever/chills Eyes: No visual changes. ENT: No sore throat. Cardiovascular: Near syncope Respiratory: Denies shortness of breath. Gastrointestinal: No abdominal pain.  Some occasional nausea and vomiting, question of hematemesis.   Genitourinary: Negative for dysuria. Musculoskeletal: Negative for neck pain.  Negative for back pain. Integumentary: Negative for rash. Neurological: Transient confusion.  Generalized weakness.  Negative for headaches, focal weakness or numbness.   ____________________________________________   PHYSICAL EXAM:  VITAL SIGNS: ED Triage Vitals [01/13/21 2236]  Enc Vitals Group     BP 126/77     Pulse Rate 98     Resp 20     Temp 98.8 F  (37.1 C)     Temp Source Oral     SpO2 95 %     Weight 99.8 kg (220 lb)     Height 1.626 m (5\' 4" )     Head Circumference      Peak Flow      Pain Score 0     Pain Loc      Pain Edu?      Excl. in GC?     Constitutional: Alert and oriented.  Eyes: Conjunctivae are normal.  Head: Atraumatic. Nose: No congestion/rhinnorhea. Mouth/Throat: Patient is wearing a mask. Neck: No stridor.  No meningeal signs.   Cardiovascular: Normal rate, regular rhythm. Good peripheral circulation. Respiratory: Normal respiratory effort.  No retractions. Gastrointestinal: Soft and nontender. No distention.  Musculoskeletal: No lower extremity tenderness nor edema. No gross deformities of extremities. Neurologic:  Normal speech and language. No gross focal neurologic deficits are appreciated.  Skin:  Skin is warm, dry and intact. Psychiatric: Mood and affect are normal. Speech and behavior are normal.  ____________________________________________   LABS (all labs ordered are listed, but only abnormal results are displayed)  Labs Reviewed  BASIC METABOLIC PANEL - Abnormal; Notable for the following components:      Result Value   Glucose, Bld 200 (*)    BUN 28 (*)    Creatinine, Ser 1.14 (*)    GFR, Estimated 51 (*)    All other components within normal limits  CBC - Abnormal; Notable for the following components:   WBC 11.8 (*)    All other components within normal limits  CBG MONITORING, ED  TROPONIN I (HIGH SENSITIVITY)   ____________________________________________  EKG  ED ECG REPORT I, , the attending physician, personally viewed and interpreted this ECG.  Date: 01/13/2021 EKG Time: 22: 35 Rate: 100 Rhythm: Borderline tachycardia QRS Axis: normal Intervals: normal ST/T Wave abnormalities: Non-specific ST segment / T-wave changes, but no clear evidence of acute ischemia. Narrative Interpretation: no definitive evidence of acute ischemia; does not meet STEMI  criteria.    ____________________________________________  RADIOLOGY I, 01/15/2021, personally viewed and evaluated these images (plain radiographs) as part of my medical decision making, as well as reviewing the written report by the radiologist.  ED MD interpretation: No acute abnormalities on head CT  Official radiology report(s): CT Head Wo Contrast  Result Date: 01/14/2021 CLINICAL DATA:  Altered mental status, headache, nausea EXAM: CT HEAD WITHOUT CONTRAST  TECHNIQUE: Contiguous axial images were obtained from the base of the skull through the vertex without intravenous contrast. COMPARISON:  09/20/2020 FINDINGS: Brain: No evidence of acute infarction, hemorrhage, hydrocephalus, extra-axial collection or mass lesion/mass effect. Vascular: Intracranial atherosclerosis. Skull: Normal. Negative for fracture or focal lesion. Sinuses/Orbits: The visualized paranasal sinuses are essentially clear. The mastoid air cells are unopacified. Other: None. IMPRESSION: Normal head CT. Electronically Signed   By: Charline Bills M.D.   On: 01/14/2021 00:03    ____________________________________________   PROCEDURES   Procedure(s) performed (including Critical Care):  .1-3 Lead EKG Interpretation  Date/Time: 01/14/2021 1:29 AM Performed by: Loleta Rose, MD Authorized by: Loleta Rose, MD     Interpretation: normal     ECG rate:  96   ECG rate assessment: normal     Rhythm: sinus rhythm     Ectopy: none     Conduction: normal     ____________________________________________   INITIAL IMPRESSION / MDM / ASSESSMENT AND PLAN / ED COURSE  As part of my medical decision making, I reviewed the following data within the electronic MEDICAL RECORD NUMBER History obtained from family, Nursing notes reviewed and incorporated, Labs reviewed , EKG interpreted , Old chart reviewed, and Notes from prior ED visits   Differential diagnosis includes, but is not limited to, intermittent or  paroxysmal A. fib, positional orthostasis, vasovagal episode, anemia, electrolyte or metabolic abnormality, acute intracranial hemorrhage, CVA, ACS.  The patient is on the cardiac monitor to evaluate for evidence of arrhythmia and/or significant heart rate changes.  I see a small amount of phlegm or emesis in the emesis bag but I do not see evidence of hematemesis; if it is bloody is a small amount such as a small Mallory-Weiss tear.  Vital signs have been stable and she has been in sinus rhythm with no tachycardia with a rate between 90 and 100.  I reviewed the medical record and see that she has recently been diagnosed with paroxysmal A. fib and started on Eliquis and I suspect the episode she is having may be when she has rhythm changes, or the fact that she had a near syncopal episode tonight after getting up and walking around could be more of a vasovagal or orthostatic issue.  However I gave her 500 mL normal saline and we checked orthostatics and she had no significant changes of her rate or pressure.  Her head CT shows no acute abnormalities.  Lab work is stable including a negative high-sensitivity troponin which is actually going down from her prior admission and an essentially normal CBC and basic metabolic panel.  We will observe the patient to see if she has any recurrent episodes, but at the moment there does not seem to be any evidence of an emergent medical condition.     Clinical Course as of 01/14/21 0147  Mon Jan 14, 2021  0010 CT Head Wo Contrast [CF]  0010 No acute intracranial abnormalities on head CT. [CF]  0143 I had an extended conversation with the patient and her daughter.  The daughter still concerned there is something going on that we are missing but the patient wants to go home.  We discussed the fact that this is most likely a continuation of the issues that happened last time and that I strongly recommend she follow-up with Dr. Welton Flakes but there is no evidence of an  emergent medical condition tonight that should keep her in the hospital.  They understand and are ready to go home  but I also understand the daughter's concern.  I gave my usual and customary follow-up recommendations and return precautions. [CF]    Clinical Course User Index [CF] Loleta Rose, MD     ____________________________________________  FINAL CLINICAL IMPRESSION(S) / ED DIAGNOSES  Final diagnoses:  Near syncope  Transient alteration of awareness     MEDICATIONS GIVEN DURING THIS VISIT:  Medications  sodium chloride 0.9 % bolus 500 mL (500 mLs Intravenous New Bag/Given 01/13/21 2358)     ED Discharge Orders     None        Note:  This document was prepared using Dragon voice recognition software and may include unintentional dictation errors.   Loleta Rose, MD 01/14/21 [phone]

## 2021-01-29 ENCOUNTER — Other Ambulatory Visit: Payer: Self-pay | Admitting: Internal Medicine

## 2021-01-29 DIAGNOSIS — E042 Nontoxic multinodular goiter: Secondary | ICD-10-CM

## 2021-03-06 ENCOUNTER — Ambulatory Visit: Payer: Medicare Other | Admitting: Obstetrics and Gynecology

## 2021-05-27 ENCOUNTER — Ambulatory Visit: Payer: Medicare Other | Admitting: Obstetrics and Gynecology

## 2021-06-10 ENCOUNTER — Other Ambulatory Visit: Payer: Self-pay

## 2021-06-10 ENCOUNTER — Ambulatory Visit (INDEPENDENT_AMBULATORY_CARE_PROVIDER_SITE_OTHER): Payer: Medicare Other | Admitting: Obstetrics and Gynecology

## 2021-06-10 ENCOUNTER — Encounter: Payer: Self-pay | Admitting: Obstetrics and Gynecology

## 2021-06-10 VITALS — BP 128/72 | Ht 64.0 in | Wt 215.0 lb

## 2021-06-10 DIAGNOSIS — Z789 Other specified health status: Secondary | ICD-10-CM

## 2021-06-10 DIAGNOSIS — R1084 Generalized abdominal pain: Secondary | ICD-10-CM

## 2021-06-10 DIAGNOSIS — R198 Other specified symptoms and signs involving the digestive system and abdomen: Secondary | ICD-10-CM | POA: Diagnosis not present

## 2021-06-10 NOTE — Progress Notes (Signed)
   GYNECOLOGY CLINIC COLPOSCOPY PROCEDURE NOTE (Colposcopy discontinued- no cervix seen)  75 y.o. G4P4 here for colposcopy for ASCUS with POSITIVE high risk HPV  pap smear on 01/28/2021. Discussed underlying role for HPV infection in the development of cervical dysplasia, its natural history and progression/regression, need for surveillance.  Is the patient  pregnant: No LMP: No LMP recorded. Patient is postmenopausal. Smoking status:  reports that she has been smoking e-cigarettes. She has never used smokeless tobacco. Contraception: none Future fertility desired:  No  Patient given informed consent, signed copy in the chart, time out was performed.  The patient was position in dorsal lithotomy position. Speculum was placed the cervix was not able to be visualized. There appeared to be a vaginal cuff rather than a cervix. Cervix was not palpated on bimanual exam.  Patient's daughter was present with her at today's visit.  Patient's daughter reported that there was a period in her mother's life that the patient had a lot of back-to-back medical procedures and what occurred during this time has been unclear.  The patient does have a personal history of a stroke and for that reason her recollection cannot be poor.  The patient does report that in the past she had a bill with [facility] for a hysterectomy that she did not recall having.  The patient and her daughter also report that she is rescheduled this visit several times because she has been having bouts of upset stomach and abdominal pain.  This pain is generalized and diffuse.  When she has these episodes she cannot be far from a toilet.  Given the appearance on today's exam I suspect that the patient has had a hysterectomy.  We will confirm with abdominal imaging by CT scan.  Will also be able to start investigation of upset stomach with CT scan results.  Patient has planned follow-up with GI.   Discussed that the patient would need to return  for colposcopy should CT scan showed that she does in fact have a uterus and cervix.   Adelene Idler MD [facility] 06/10/2021 2:08 PM

## 2021-07-10 ENCOUNTER — Other Ambulatory Visit: Payer: Self-pay

## 2021-07-10 ENCOUNTER — Ambulatory Visit
Admission: RE | Admit: 2021-07-10 | Discharge: 2021-07-10 | Disposition: A | Discharge status: Home / Self Care | Payer: Medicare Other | Source: Ambulatory Visit | Attending: Obstetrics and Gynecology | Admitting: Obstetrics and Gynecology

## 2021-07-10 DIAGNOSIS — R198 Other specified symptoms and signs involving the digestive system and abdomen: Secondary | ICD-10-CM | POA: Diagnosis present

## 2021-07-10 DIAGNOSIS — Z789 Other specified health status: Secondary | ICD-10-CM | POA: Insufficient documentation

## 2021-07-10 DIAGNOSIS — R1084 Generalized abdominal pain: Secondary | ICD-10-CM | POA: Insufficient documentation

## 2021-07-10 LAB — POCT I-STAT CREATININE: Creatinine, Ser: 0.9 mg/dL (ref 0.44–1.00)

## 2021-07-10 MED ORDER — IOHEXOL 350 MG/ML SOLN
100.0000 mL | Freq: Once | INTRAVENOUS | Status: AC | PRN
  Administered 2021-07-10: 100 mL via INTRAVENOUS

## 2021-07-15 ENCOUNTER — Other Ambulatory Visit: Payer: Self-pay

## 2021-07-15 ENCOUNTER — Ambulatory Visit (INDEPENDENT_AMBULATORY_CARE_PROVIDER_SITE_OTHER): Payer: Medicare Other | Admitting: Obstetrics and Gynecology

## 2021-07-15 DIAGNOSIS — R87610 Atypical squamous cells of undetermined significance on cytologic smear of cervix (ASC-US): Secondary | ICD-10-CM | POA: Diagnosis not present

## 2021-07-15 NOTE — Progress Notes (Signed)
Virtual Visit via Telephone Note  I connected with Stephanie Small on 07/15/21 at 10:35 AM EST by telephone and verified that I am speaking with the correct person using two identifiers.   I discussed the limitations, risks, security and privacy concerns of performing an evaluation and management service by telephone and the availability of in person appointments. I also discussed with the patient that there may be a patient responsible charge related to this service. The patient expressed understanding and agreed to proceed.  The patient was at home I spoke with the patient and her daughter from my  office  The names of people involved in this encounter were: [facility], Dr. Jerene Pitch and daughter Stephanie Small)   History of Present Illness: Reviewed CT results. Discussed with patient that CT had suggested constipation. Patient and daughter report that she has many episodes of diarrhea. She has not yet seen the GI doctor.   Discussed that the CT was suggestive of a uterus. Recommended a pelvic US and an exam under anesthesia to complete the colposcopy since her cervix was not able to be seen in the office.    Patient's daughter reports they will need to consider this recommendation and discuss with the patient's PCP. Patient is on a fixed income and needs to prioritize medical expenses.   Observations/Objective:  Physical Exam could not be performed. Because of the COVID-19 outbreak this visit was performed over the phone and not in person.   Assessment and Plan: 76 yo with atypical pap smear. Exam limited but did not clearly show a cervix. CT suggests a uterine structure.  Recommended a pelvic US and an exam under anesthesia to complete the colposcopy since her cervix was not able to be seen in the office.    Patient's daughter reports they will need to consider this recommendation and discuss with the patient's PCP. Patient is on a fixed income and needs to prioritize medical  expenses.   Follow Up Instructions: As desired by patient   I discussed the assessment and treatment plan with the patient. The patient was provided an opportunity to ask questions and all were answered. The patient agreed with the plan and demonstrated an understanding of the instructions.   The patient was advised to call back or seek an in-person evaluation if the symptoms worsen or if the condition fails to improve as anticipated.  I provided 5 minutes of non-face-to-face time during this encounter.  Adelene Idler MD [facility], [facility] 07/15/2021 10:56 AM

## 2021-07-26 ENCOUNTER — Emergency Department: Payer: Medicare Other

## 2021-07-26 ENCOUNTER — Other Ambulatory Visit: Payer: Self-pay

## 2021-07-26 ENCOUNTER — Emergency Department
Admission: EM | Admit: 2021-07-26 | Discharge: 2021-07-26 | Disposition: A | Discharge status: Home / Self Care | Payer: Medicare Other | Attending: Emergency Medicine | Admitting: Emergency Medicine

## 2021-07-26 ENCOUNTER — Encounter: Payer: Self-pay | Admitting: Emergency Medicine

## 2021-07-26 DIAGNOSIS — R079 Chest pain, unspecified: Secondary | ICD-10-CM | POA: Diagnosis present

## 2021-07-26 DIAGNOSIS — E1122 Type 2 diabetes mellitus with diabetic chronic kidney disease: Secondary | ICD-10-CM | POA: Diagnosis not present

## 2021-07-26 DIAGNOSIS — B029 Zoster without complications: Secondary | ICD-10-CM | POA: Diagnosis not present

## 2021-07-26 DIAGNOSIS — Z20822 Contact with and (suspected) exposure to covid-19: Secondary | ICD-10-CM | POA: Insufficient documentation

## 2021-07-26 DIAGNOSIS — K5730 Diverticulosis of large intestine without perforation or abscess without bleeding: Secondary | ICD-10-CM | POA: Insufficient documentation

## 2021-07-26 DIAGNOSIS — R0789 Other chest pain: Secondary | ICD-10-CM | POA: Insufficient documentation

## 2021-07-26 DIAGNOSIS — N1831 Chronic kidney disease, stage 3a: Secondary | ICD-10-CM | POA: Diagnosis not present

## 2021-07-26 DIAGNOSIS — G8929 Other chronic pain: Secondary | ICD-10-CM

## 2021-07-26 DIAGNOSIS — J449 Chronic obstructive pulmonary disease, unspecified: Secondary | ICD-10-CM | POA: Diagnosis not present

## 2021-07-26 DIAGNOSIS — M549 Dorsalgia, unspecified: Secondary | ICD-10-CM | POA: Diagnosis not present

## 2021-07-26 DIAGNOSIS — I129 Hypertensive chronic kidney disease with stage 1 through stage 4 chronic kidney disease, or unspecified chronic kidney disease: Secondary | ICD-10-CM | POA: Diagnosis not present

## 2021-07-26 LAB — COMPREHENSIVE METABOLIC PANEL
ALT: 26 U/L (ref 0–44)
AST: 26 U/L (ref 15–41)
Albumin: 3.9 g/dL (ref 3.5–5.0)
Alkaline Phosphatase: 106 U/L (ref 38–126)
Anion gap: 8 (ref 5–15)
BUN: 23 mg/dL (ref 8–23)
CO2: 26 mmol/L (ref 22–32)
Calcium: 9.2 mg/dL (ref 8.9–10.3)
Chloride: 103 mmol/L (ref 98–111)
Creatinine, Ser: 0.91 mg/dL (ref 0.44–1.00)
GFR, Estimated: >60 mL/min (ref >60)
Glucose, Bld: 135 mg/dL — ABNORMAL HIGH (ref 70–99)
Potassium: 4.5 mmol/L (ref 3.5–5.1)
Sodium: 137 mmol/L (ref 135–145)
Total Bilirubin: 0.4 mg/dL (ref 0.3–1.2)
Total Protein: 6.8 g/dL (ref 6.5–8.1)

## 2021-07-26 LAB — CBC WITH DIFFERENTIAL/PLATELET
Abs Immature Granulocytes: 0.01 10*3/uL (ref 0.00–0.07)
Basophils Absolute: 0.1 10*3/uL (ref 0.0–0.1)
Basophils Relative: 1 %
Eosinophils Absolute: 0.1 10*3/uL (ref 0.0–0.5)
Eosinophils Relative: 1 %
HCT: 38 % (ref 36.0–46.0)
Hemoglobin: 12.7 g/dL (ref 12.0–15.0)
Immature Granulocytes: 0 %
Lymphocytes Relative: 21 %
Lymphs Abs: 1.1 10*3/uL (ref 0.7–4.0)
MCH: 33.1 pg (ref 26.0–34.0)
MCHC: 33.4 g/dL (ref 30.0–36.0)
MCV: 99 fL (ref 80.0–100.0)
Monocytes Absolute: 0.5 10*3/uL (ref 0.1–1.0)
Monocytes Relative: 10 %
Neutro Abs: 3.6 10*3/uL (ref 1.7–7.7)
Neutrophils Relative %: 67 %
Platelets: 197 10*3/uL (ref 150–400)
RBC: 3.84 MIL/uL — ABNORMAL LOW (ref 3.87–5.11)
RDW: 12.5 % (ref 11.5–15.5)
WBC: 5.3 10*3/uL (ref 4.0–10.5)
nRBC: 0 % (ref 0.0–0.2)

## 2021-07-26 LAB — RESP PANEL BY RT-PCR (FLU A&B, COVID) ARPGX2
Influenza A by PCR: NEGATIVE
Influenza B by PCR: NEGATIVE
SARS Coronavirus 2 by RT PCR: NEGATIVE

## 2021-07-26 LAB — PROTIME-INR
INR: 1.2 (ref 0.8–1.2)
Prothrombin Time: 15.5 seconds — ABNORMAL HIGH (ref 11.4–15.2)

## 2021-07-26 LAB — TROPONIN I (HIGH SENSITIVITY)
Troponin I (High Sensitivity): 3 ng/L (ref <18)
Troponin I (High Sensitivity): 3 ng/L (ref <18)

## 2021-07-26 MED ORDER — OXYCODONE HCL ER 15 MG PO T12A: 60.0000 mg | EXTENDED_RELEASE_TABLET | ORAL | Status: DC

## 2021-07-26 MED ORDER — OXYCODONE HCL 5 MG PO TABS
10.0000 mg | ORAL_TABLET | ORAL | Status: AC
  Administered 2021-07-26: 10 mg via ORAL
  Filled 2021-07-26: qty 2

## 2021-07-26 MED ORDER — GABAPENTIN 300 MG PO CAPS
400.0000 mg | ORAL_CAPSULE | ORAL | Status: AC
  Administered 2021-07-26: 400 mg via ORAL
  Filled 2021-07-26: qty 1

## 2021-07-26 MED ORDER — MORPHINE SULFATE (PF) 4 MG/ML IV SOLN
4.0000 mg | Freq: Once | INTRAVENOUS | Status: AC
  Administered 2021-07-26: 4 mg via INTRAVENOUS
  Filled 2021-07-26: qty 1

## 2021-07-26 MED ORDER — VALACYCLOVIR HCL 1 G PO TABS: 1000.0000 mg | ORAL_TABLET | Freq: Three times a day (TID) | ORAL | 0 refills | Status: AC

## 2021-07-26 MED ORDER — IOHEXOL 350 MG/ML SOLN
100.0000 mL | Freq: Once | INTRAVENOUS | Status: AC | PRN
  Administered 2021-07-26: 100 mL via INTRAVENOUS

## 2021-07-26 NOTE — Discharge Instructions (Signed)
Your CT scan and lab tests were all okay today.  Please start taking valacyclovir for shingles and follow-up with your doctor.

## 2021-07-26 NOTE — ED Triage Notes (Signed)
Pt to room 10 via w/c, appears uncomfortable; pt reports pain to left breast radiating into back since yesterday with no accomp symptoms; denies hx of same

## 2021-07-26 NOTE — ED Provider Notes (Signed)
Procedures     ----------------------------------------- 10:01 AM on 07/26/2021 ----------------------------------------- CT angiogram reviewed and interpreted by me, no apparent dissection.  Radiology report reviewed.  Serial troponins are unremarkable.  Patient also notes a hot burning pain in her right lower back, and inspection of this area does reveal a few small erythematous lesions, concerning for erupting shingles.  We will start her on valacyclovir and have her follow-up with PCP.  Final diagnoses:  Chest pain, unspecified type  Chronic back pain, unspecified back location, unspecified back pain laterality  Herpes zoster without complication        Sharman Cheek, MD 07/26/21 1002

## 2021-07-26 NOTE — ED Notes (Signed)
Helped pt out of bed to void in toilet.  Pt now stating she has had burning, hot pain on R lower back since yesterday and pt has 2 small red lesions next to spine on R upper lumbar region. Pt asking if this may be shingles. EDP informed of pt complaints.

## 2021-07-26 NOTE — ED Notes (Signed)
ED pyxis out of long acting oxy, per other RN who checked. Wrote to pharmacy to inform.

## 2021-07-26 NOTE — ED Provider Notes (Signed)
[facility] Provider Note    None    (approximate)   History   Chest Pain   HPI  NATALIE CUADROS is a 76 y.o. female  with pmh HTN, HLD, COPD, GERD, CKD, DM who presents with chest pain.  Patient's chest pain started at around 3 AM today.  Pain is located along the left breast, does not radiate to the jaw or arms.  Pain is pressure-like.  Has been coming and going, nonexertional nonpleuritic.  She denies shortness of breath nausea or diaphoresis.  Patient also complains of back pain which has been going on for several days.  Of note she has chronic low back pain but this pain is somewhat different.  It is located primarily on the right side and involves the whole spine down to the right leg.  She has no numbness or weakness.  Of note patient has issues with chronic pain and she is on OxyContin and oxycodone at home for pain.  She has a history of coronary disease with prior stenting.  Past Medical History:  Diagnosis Date   Arthritis    COPD (chronic obstructive pulmonary disease) ([place])    Diabetes mellitus without complication ([place])    Glaucoma 2010   Hypertension 2005   Personal history of colonic polyps    Thyroid cyst     Patient Active Problem List   Diagnosis Date Noted   COPD exacerbation ([place]) 01/09/2021   HLD (hyperlipidemia) 01/09/2021   Sepsis ([place]) 01/09/2021   GERD (gastroesophageal reflux disease) 01/09/2021   Depression 01/09/2021   Acute renal failure superimposed on stage 3a chronic kidney disease ([place]) 01/09/2021   UTI (urinary tract infection) 01/09/2021   New onset atrial fibrillation ([place]) 01/09/2021   Diarrhea 01/09/2021   Hypomagnesemia 01/09/2021   Chronic pain syndrome 01/09/2021   Elevated troponin    Chest pain 09/20/2020   Dilated cbd, acquired 03/18/2017   Pain medication agreement signed 03/05/2017   Atypical squamous cells of undetermined significance (ASCUS) on Papanicolaou smear of cervix 10/08/2016   History of  adenomatous polyp of colon    Benign essential tremor 12/13/2015   Essential hypertension 10/25/2015   Goiter 10/25/2015   Respiratory distress 10/25/2015   Thyroid cyst 10/25/2015   Type II diabetes mellitus with renal manifestations ([place]) 10/25/2015   Wheezing 10/25/2015   COPD with hypoxia ([place]) 10/23/2015   Gastroesophageal reflux disease with esophagitis 01/18/2015   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      Excl. in [place]?     Most recent vital signs: Vitals:   07/26/21 0600 07/26/21 0630  BP: 123/84 (!) 149/97  Pulse: (!) 59 72  Resp: 14 16  Temp:    SpO2: 95% 96%     General: Awake, appears uncomfortable CV:  Good peripheral perfusion.  2+ radial and DP pulses bilaterally Resp:  Normal effort.  Abd:  No distention.  Soft and nontender throughout Neuro:             Awake, Alert, Oriented x 3  Other:  Patient has tenderness to palpation fairly diffusely along her back including in the mid thoracic and lumbar regions both midline and right paraspinal 5/5 strength in the bilateral lower extremities   ED Results / Procedures / Treatments  Labs (all labs ordered are listed, but only abnormal results are displayed) Labs Reviewed  COMPREHENSIVE METABOLIC PANEL - Abnormal; Notable for the following components:      Result Value   Glucose, Bld 135 (*)    All other components within normal limits  PROTIME-INR - Abnormal; Notable for the following components:    Prothrombin Time 15.5 (*)    All other components within normal limits  CBC WITH DIFFERENTIAL/PLATELET - Abnormal; Notable for the following components:   RBC 3.84 (*)    All other components within normal limits  RESP PANEL BY RT-PCR (FLU A&B, COVID) ARPGX2  TROPONIN I (HIGH SENSITIVITY)     EKG  EKG interpretation performed by myself: NSR, nml axis, nml intervals, no acute ischemic changes    RADIOLOGY I reviewed the CXR which does not show any acute cardiopulmonary process; agree with radiology report     PROCEDURES:  Critical Care performed: No  .1-3 Lead EKG Interpretation Performed by: Rada Hay, MD Authorized by: Rada Hay, MD     Interpretation: normal     ECG rate assessment: normal     Rhythm: sinus rhythm     Ectopy: none     Conduction: normal    The patient is on the cardiac monitor to evaluate for evidence of arrhythmia and/or significant heart rate changes.   MEDICATIONS ORDERED IN ED: Medications  morphine 4 MG/ML injection 4 mg (4 mg Intravenous Given 07/26/21 0551)     IMPRESSION / MDM / ASSESSMENT AND PLAN / ED COURSE  I reviewed the triage vital signs and the nursing notes.                              Differential diagnosis includes, but is not limited to, ACS, pulmonary embolism, aortic dissection, musculoskeletal pain  Patient is 76 year old female presenting with both chest pain and back pain.  The back pain is been going on for several days and patient does struggle with chronic back pain for which she takes OxyContin and oxycodone for.  Back pain is fairly worse and involves the whole right side and radiates down the right leg but without associated numbness or weakness or other signs of cord compression.  Her chest pain started around 3 AM tonight and is located primarily in the left chest without radiation.  She has no shortness of breath nausea or diaphoresis.  On arrival patient looks uncomfortable.  She is hypertensive  the rest of her vital signs are within normal limits.  Her back is tender rather diffusely both lumbar and thoracic and paraspinal and midline.  Abdomen is soft and nontender.  She has equal pulses throughout.  Reviewed her initial EKG which is overall reassuring there are no acute ischemic changes.  I am concerned for aortic dissection versus ACS primarily.  The back pain may be  unrelated but with her significant risk factors and this new chest and worsening back pain will obtain a CT angio to rule out dissection.  Will need serial troponins as well.  Reassessment after morphine patient's pain is much improved.  Patient's initial troponin is 3.  We will need to repeat in 2 hours given onset of her chest pain was only about 3 hours ago.  She is still pending her CT angio.  Signed out to oncoming provider pending repeat troponin and CTA.     FINAL CLINICAL IMPRESSION(S) / ED DIAGNOSES   Final diagnoses:  Chest pain, unspecified type  Chronic back pain, unspecified back location, unspecified back pain laterality     Rx / DC Orders   ED Discharge Orders     None        Note:  This document was prepared using Dragon voice recognition software and may include unintentional dictation errors.   Rada Hay, MD 07/26/21 [phone]

## 2021-08-14 ENCOUNTER — Ambulatory Visit
Admission: RE | Admit: 2021-08-14 | Discharge: 2021-08-14 | Disposition: A | Discharge status: Home / Self Care | Payer: Medicare Other | Source: Ambulatory Visit | Attending: Family | Admitting: Family

## 2021-08-14 ENCOUNTER — Other Ambulatory Visit: Payer: Self-pay

## 2021-08-14 ENCOUNTER — Other Ambulatory Visit: Payer: Self-pay | Admitting: Family

## 2021-08-14 DIAGNOSIS — R1031 Right lower quadrant pain: Secondary | ICD-10-CM

## 2022-05-06 DIAGNOSIS — M545 Low back pain, unspecified: Secondary | ICD-10-CM | POA: Insufficient documentation

## 2022-05-06 DIAGNOSIS — M25569 Pain in unspecified knee: Secondary | ICD-10-CM

## 2022-05-06 HISTORY — DX: Pain in unspecified knee: M25.569

## 2022-05-06 HISTORY — DX: Low back pain, unspecified: M54.50

## 2022-09-02 ENCOUNTER — Telehealth: Payer: Self-pay

## 2022-09-02 DIAGNOSIS — R590 Localized enlarged lymph nodes: Secondary | ICD-10-CM

## 2022-09-02 NOTE — Telephone Encounter (Signed)
Pt's daughter called and left vm regarding nodule in pt's neck- said it's swollen & has been tender to the touch. She called ENT and tried to make an appt, but they won't without [place] sending referral. Asked if we can send referral to ENT for pt? Please advise

## 2022-09-02 NOTE — Addendum Note (Signed)
Addended by: Georgian Co on: 09/02/2022 01:23 PM   Modules accepted: Orders

## 2022-11-17 ENCOUNTER — Other Ambulatory Visit: Payer: Self-pay | Admitting: Internal Medicine

## 2022-11-17 ENCOUNTER — Other Ambulatory Visit: Payer: Self-pay | Admitting: Family

## 2022-11-17 DIAGNOSIS — I10 Essential (primary) hypertension: Secondary | ICD-10-CM

## 2022-12-23 ENCOUNTER — Encounter: Payer: Self-pay | Admitting: Family

## 2022-12-23 ENCOUNTER — Ambulatory Visit (INDEPENDENT_AMBULATORY_CARE_PROVIDER_SITE_OTHER): Payer: Medicare Other | Admitting: Family

## 2022-12-23 VITALS — BP 130/80 | HR 101 | Ht 64.0 in | Wt 222.0 lb

## 2022-12-23 DIAGNOSIS — R6884 Jaw pain: Secondary | ICD-10-CM

## 2022-12-23 MED ORDER — AMOXICILLIN-POT CLAVULANATE 875-125 MG PO TABS: 1.0000 | ORAL_TABLET | Freq: Two times a day (BID) | ORAL | 0 refills | Status: DC

## 2022-12-23 NOTE — Progress Notes (Signed)
   Acute Office Visit  Subjective:     Patient ID: Stephanie Small, female    DOB: 1946/05/27, 77 y.o.   MRN: [id number]  Patient is in today for  Chief Complaint  Patient presents with   Otalgia    Otalgia  There is pain in the left ear. This is a new problem. The current episode started in the past 7 days. The problem occurs constantly. The problem has been gradually improving. There has been no fever. The pain is moderate. Pertinent negatives include no ear discharge. Associated symptoms comments: Jaw pain. She has tried acetaminophen and NSAIDs for the symptoms. The treatment provided mild relief.     Review of Systems  HENT:  Positive for ear pain. Negative for ear discharge.   All other systems reviewed and are negative.     Objective:    BP 130/80 (BP Location: Left Arm, Patient Position: Sitting)   Pulse (!) 101   Ht 5\' 4"  (1.626 m)   Wt 222 lb (100.7 kg)   SpO2 92%   BMI 38.11 kg/m   Physical Exam Vitals and nursing note reviewed.  Constitutional:      Appearance: Normal appearance. She is normal weight.  HENT:     Head: Normocephalic and atraumatic.     Jaw: There is normal jaw occlusion. Tenderness and pain on movement present.     Right Ear: Tympanic membrane, ear canal and external ear normal.     Left Ear: Tympanic membrane, ear canal and external ear normal.     Nose: Nose normal.  Eyes:     Conjunctiva/sclera: Conjunctivae normal.     Pupils: Pupils are equal, round, and reactive to light.  Cardiovascular:     Rate and Rhythm: Normal rate.     Pulses: Normal pulses.  Pulmonary:     Effort: Pulmonary effort is normal.  Musculoskeletal:     Cervical back: Normal range of motion.  Neurological:     Mental Status: She is alert.     No results found for any visits on 12/23/22.  No results found for this or any previous visit (from the past 2160 hour(s)).     Assessment & Plan:   Problem List Items Addressed This Visit   None Visit  Diagnoses     Jaw pain    -  Primary   Suggested pt use NSAIDs to help   Relevant Orders   DG Mandible 1-3 Views        Return as previously scheduled.  Total time spent: 20 minutes  Miki Kins, FNP  12/23/2022   This document may have been prepared by [facility] Voice Recognition software and as such may include unintentional dictation errors.

## 2023-01-10 ENCOUNTER — Encounter: Payer: Self-pay | Admitting: Family

## 2023-01-30 ENCOUNTER — Ambulatory Visit: Payer: Medicare Other | Admitting: Family

## 2023-01-30 VITALS — BP 120/76 | HR 106 | Ht 64.0 in | Wt 220.8 lb

## 2023-01-30 DIAGNOSIS — E782 Mixed hyperlipidemia: Secondary | ICD-10-CM

## 2023-01-30 DIAGNOSIS — E559 Vitamin D deficiency, unspecified: Secondary | ICD-10-CM

## 2023-01-30 DIAGNOSIS — E1122 Type 2 diabetes mellitus with diabetic chronic kidney disease: Secondary | ICD-10-CM

## 2023-01-30 DIAGNOSIS — J449 Chronic obstructive pulmonary disease, unspecified: Secondary | ICD-10-CM

## 2023-01-30 DIAGNOSIS — R6884 Jaw pain: Secondary | ICD-10-CM | POA: Diagnosis not present

## 2023-01-30 DIAGNOSIS — I10 Essential (primary) hypertension: Secondary | ICD-10-CM

## 2023-01-30 DIAGNOSIS — E039 Hypothyroidism, unspecified: Secondary | ICD-10-CM

## 2023-01-30 DIAGNOSIS — N1832 Chronic kidney disease, stage 3b: Secondary | ICD-10-CM

## 2023-01-30 DIAGNOSIS — E538 Deficiency of other specified B group vitamins: Secondary | ICD-10-CM

## 2023-01-30 DIAGNOSIS — R5383 Other fatigue: Secondary | ICD-10-CM

## 2023-01-30 MED ORDER — LEVOFLOXACIN 500 MG PO TABS: 500.0000 mg | ORAL_TABLET | Freq: Every day | ORAL | 0 refills | Status: AC

## 2023-01-30 NOTE — Progress Notes (Signed)
Established Patient Office Visit  Subjective:  Patient ID: Stephanie Small, female    DOB: 05/19/1946  Age: 77 y.o. MRN: [id number]  Chief Complaint  Patient presents with   Acute Visit    Ear pain, leg swelling    Patient here today for follow up.  Ear ache has not gone away, is now hurting all the way down into her jaw and up to her temple.  She is due for labs.   No other concerns at this time.   Past Medical History:  Diagnosis Date   Arthritis    Atypical squamous cells of undetermined significance (ASCUS) on Papanicolaou smear of cervix 10/08/2016   Chest pain 09/20/2020   COPD (chronic obstructive pulmonary disease) (HCC)    Diabetes mellitus without complication (HCC)    Diarrhea 01/09/2021   Elevated troponin    Glaucoma 2010   History of adenomatous polyp of colon    Hypertension 2005   Hypomagnesemia 01/09/2021   Knee pain 05/06/2022   Low back pain of over 3 months duration 05/06/2022   Personal history of colonic polyps    Respiratory distress 10/25/2015   Sepsis (HCC) 01/09/2021   Thyroid cyst    UTI (urinary tract infection) 01/09/2021   Wheezing 10/25/2015    Past Surgical History:  Procedure Laterality Date   CAROTID STENT INSERTION  2011   CHOLECYSTECTOMY  2009   COLONOSCOPY  2007   COLONOSCOPY WITH PROPOFOL N/A 08/18/2016   Procedure: COLONOSCOPY WITH PROPOFOL;  Surgeon: Midge Minium, MD;  Location: [facility];  Service: Endoscopy;  Laterality: N/A;   FOOT SURGERY     SHOULDER SURGERY Bilateral 2004   UPPER GI ENDOSCOPY      Social History   Socioeconomic History   Marital status: Widowed    Spouse name: Not on file   Number of children: Not on file   Years of education: Not on file   Highest education level: Not on file  Occupational History   Not on file  Tobacco Use   Smoking status: Every Day    Types: E-cigarettes   Smokeless tobacco: Never  Vaping Use   Vaping status: Never Used  Substance and Sexual Activity    Alcohol use: No   Drug use: No   Sexual activity: Not Currently    Birth control/protection: Post-menopausal  Other Topics Concern   Not on file  Social History Narrative   Not on file   Social Determinants of Health   Financial Resource Strain: Not on file  Food Insecurity: Not on file  Transportation Needs: Not on file  Physical Activity: Not on file  Stress: Not on file  Social Connections: Not on file  Intimate Partner Violence: Not on file    Family History  Problem Relation Age of Onset   Hypertension Mother     Allergies  Allergen Reactions   Shellfish-Derived Products Anaphylaxis and Rash    Allergic to oysters, not sure if she's allergic to shellfish.  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   Shellfish Allergy Rash    Review of Systems  HENT:  Positive for ear pain.        Jaw pain  All other systems reviewed and are negative.      Objective:   BP 120/76   Pulse (!) 106   Ht 5\' 4"  (1.626 m)   Wt 220 lb 12.8 oz (100.2 kg)   SpO2 94%   BMI 37.90 kg/m  Vitals:   01/30/23 1510  BP: 120/76  Pulse: (!) 106  Height: 5\' 4"  (1.626 m)  Weight: 220 lb 12.8 oz (100.2 kg)  SpO2: 94%  BMI (Calculated): 37.88    Physical Exam Vitals and nursing note reviewed.  Constitutional:      Appearance: Normal appearance. She is normal weight.  HENT:     Head: Normocephalic.     Mouth/Throat:     Comments: Jaw pain, radiating to ear, especially on left side. Eyes:     Pupils: Pupils are equal, round, and reactive to light.  Cardiovascular:     Rate and Rhythm: Normal rate.  Pulmonary:     Effort: Pulmonary effort is normal.  Neurological:     Mental Status: She is alert.      Results for orders placed or performed in visit on 01/30/23  Lipid panel  Result Value Ref Range   Cholesterol, Total 117 100 - 199 mg/dL   Triglycerides 93 0 - 149 mg/dL   HDL 55 >13 mg/dL   VLDL Cholesterol Cal 18 5 - 40 mg/dL   LDL Chol Calc (NIH)  44 0 - 99 mg/dL   Chol/HDL Ratio 2.1 0.0 - 4.4 ratio  VITAMIN D 25 Hydroxy (Vit-D Deficiency, Fractures)  Result Value Ref Range   Vit D, 25-Hydroxy 37.6 30.0 - 100.0 ng/mL  CMP14+EGFR  Result Value Ref Range   Glucose 140 (H) 70 - 99 mg/dL   BUN 21 8 - 27 mg/dL   Creatinine, Ser 0.86 (H) 0.57 - 1.00 mg/dL   eGFR 58 (L) >57 QI/ONG/2.95   BUN/Creatinine Ratio 21 12 - 28   Sodium 137 134 - 144 mmol/L   Potassium 4.7 3.5 - 5.2 mmol/L   Chloride 99 96 - 106 mmol/L   CO2 24 20 - 29 mmol/L   Calcium 9.4 8.7 - 10.3 mg/dL   Total Protein 6.8 6.0 - 8.5 g/dL   Albumin 4.2 3.8 - 4.8 g/dL   Globulin, Total 2.6 1.5 - 4.5 g/dL   Bilirubin Total 0.3 0.0 - 1.2 mg/dL   Alkaline Phosphatase 160 (H) 44 - 121 IU/L   AST 14 0 - 40 IU/L   ALT 14 0 - 32 IU/L  TSH  Result Value Ref Range   TSH 0.304 (L) 0.450 - 4.500 uIU/mL  Hemoglobin A1c  Result Value Ref Range   Hgb A1c MFr Bld 7.3 (H) 4.8 - 5.6 %   Est. average glucose Bld gHb Est-mCnc 163 mg/dL  Vitamin M84  Result Value Ref Range   Vitamin B-12 323 232 - 1,245 pg/mL  CBC with Diff  Result Value Ref Range   WBC 7.0 3.4 - 10.8 x10E3/uL   RBC 4.28 3.77 - 5.28 x10E6/uL   Hemoglobin 13.4 11.1 - 15.9 g/dL   Hematocrit 13.2 44.0 - 46.6 %   MCV 94 79 - 97 fL   MCH 31.3 26.6 - 33.0 pg   MCHC 33.3 31.5 - 35.7 g/dL   RDW 10.2 72.5 - 36.6 %   Platelets 266 150 - 450 x10E3/uL   Neutrophils 58 Not Estab. %   Lymphs 31 Not Estab. %   Monocytes 8 Not Estab. %   Eos 1 Not Estab. %   Basos 1 Not Estab. %   Neutrophils Absolute 4.1 1.4 - 7.0 x10E3/uL   Lymphocytes Absolute 2.2 0.7 - 3.1 x10E3/uL   Monocytes Absolute 0.6 0.1 - 0.9 x10E3/uL   EOS (ABSOLUTE) 0.1 0.0 - 0.4 x10E3/uL   Basophils Absolute  0.1 0.0 - 0.2 x10E3/uL   Immature Granulocytes 1 Not Estab. %   Immature Grans (Abs) 0.0 0.0 - 0.1 x10E3/uL    Recent Results (from the past 2160 hour(s))  Lipid panel     Status: None   Collection Time: 01/30/23  3:50 PM  Result Value Ref Range    Cholesterol, Total 117 100 - 199 mg/dL   Triglycerides 93 0 - 149 mg/dL   HDL 55 >42 mg/dL   VLDL Cholesterol Cal 18 5 - 40 mg/dL   LDL Chol Calc (NIH) 44 0 - 99 mg/dL   Chol/HDL Ratio 2.1 0.0 - 4.4 ratio    Comment:                                   T. Chol/HDL Ratio                                             Men  Women                               1/2 Avg.Risk  3.4    3.3                                   Avg.Risk  5.0    4.4                                2X Avg.Risk  9.6    7.1                                3X Avg.Risk 23.4   11.0   VITAMIN D 25 Hydroxy (Vit-D Deficiency, Fractures)     Status: None   Collection Time: 01/30/23  3:50 PM  Result Value Ref Range   Vit D, 25-Hydroxy 37.6 30.0 - 100.0 ng/mL    Comment: Vitamin D deficiency has been defined by the Institute of Medicine and an Endocrine Society practice guideline as a level of serum 25-OH vitamin D less than 20 ng/mL (1,2). The Endocrine Society went on to further define vitamin D insufficiency as a level between 21 and 29 ng/mL (2). 1. IOM (Institute of Medicine). 2010. Dietary reference    intakes for calcium and D. [place]: The    [employer]. 2. Holick MF, [facility] [place], Bischoff-Ferrari HA, et al.    Evaluation, treatment, and prevention of vitamin D    deficiency: an Endocrine Society clinical practice    guideline. JCEM. 2011 Jul; 96(7):1911-30.   CMP14+EGFR     Status: Abnormal   Collection Time: 01/30/23  3:50 PM  Result Value Ref Range   Glucose 140 (H) 70 - 99 mg/dL   BUN 21 8 - 27 mg/dL   Creatinine, Ser 5.95 (H) 0.57 - 1.00 mg/dL   eGFR 58 (L) >63 OV/FIE/3.32   BUN/Creatinine Ratio 21 12 - 28   Sodium 137 134 - 144 mmol/L   Potassium 4.7 3.5 - 5.2 mmol/L   Chloride 99 96 - 106 mmol/L   CO2 24 20 - 29  mmol/L   Calcium 9.4 8.7 - 10.3 mg/dL   Total Protein 6.8 6.0 - 8.5 g/dL   Albumin 4.2 3.8 - 4.8 g/dL   Globulin, Total 2.6 1.5 - 4.5 g/dL   Bilirubin Total 0.3 0.0 - 1.2  mg/dL   Alkaline Phosphatase 160 (H) 44 - 121 IU/L   AST 14 0 - 40 IU/L   ALT 14 0 - 32 IU/L  TSH     Status: Abnormal   Collection Time: 01/30/23  3:50 PM  Result Value Ref Range   TSH 0.304 (L) 0.450 - 4.500 uIU/mL  Hemoglobin A1c     Status: Abnormal   Collection Time: 01/30/23  3:50 PM  Result Value Ref Range   Hgb A1c MFr Bld 7.3 (H) 4.8 - 5.6 %    Comment:          Prediabetes: 5.7 - 6.4          Diabetes: >6.4          Glycemic control for adults with diabetes: <7.0    Est. average glucose Bld gHb Est-mCnc 163 mg/dL  Vitamin Z61     Status: None   Collection Time: 01/30/23  3:50 PM  Result Value Ref Range   Vitamin B-12 323 232 - 1,245 pg/mL  CBC with Diff     Status: None   Collection Time: 01/30/23  3:50 PM  Result Value Ref Range   WBC 7.0 3.4 - 10.8 x10E3/uL   RBC 4.28 3.77 - 5.28 x10E6/uL   Hemoglobin 13.4 11.1 - 15.9 g/dL   Hematocrit 09.6 04.5 - 46.6 %   MCV 94 79 - 97 fL   MCH 31.3 26.6 - 33.0 pg   MCHC 33.3 31.5 - 35.7 g/dL   RDW 40.9 81.1 - 91.4 %   Platelets 266 150 - 450 x10E3/uL   Neutrophils 58 Not Estab. %   Lymphs 31 Not Estab. %   Monocytes 8 Not Estab. %   Eos 1 Not Estab. %   Basos 1 Not Estab. %   Neutrophils Absolute 4.1 1.4 - 7.0 x10E3/uL   Lymphocytes Absolute 2.2 0.7 - 3.1 x10E3/uL   Monocytes Absolute 0.6 0.1 - 0.9 x10E3/uL   EOS (ABSOLUTE) 0.1 0.0 - 0.4 x10E3/uL   Basophils Absolute 0.1 0.0 - 0.2 x10E3/uL   Immature Granulocytes 1 Not Estab. %   Immature Grans (Abs) 0.0 0.0 - 0.1 x10E3/uL       Assessment & Plan:   Problem List Items Addressed This Visit       Active Problems   COPD with hypoxia (HCC)    Patient stable.  Well controlled with current therapy.   Continue current meds.        Essential hypertension    Blood pressure well controlled with current medications.  Continue current therapy.  Will reassess at follow up.        Relevant Medications   losartan (COZAAR) 50 MG tablet   Type 2 diabetes mellitus  with stage 3 chronic kidney disease (HCC)    Checking labs today. Will call pt. With results  Continue current diabetes POC, as patient has been well controlled on current regimen.  Will adjust meds if needed based on labs.        Relevant Medications   losartan (COZAAR) 50 MG tablet   HLD (hyperlipidemia)    Checking labs today.  Continue current therapy for lipid control. Will modify as needed based on labwork results.  Relevant Medications   losartan (COZAAR) 50 MG tablet   Other Relevant Orders   Lipid panel (Completed)   CMP14+EGFR (Completed)   CBC with Diff (Completed)   Other Visit Diagnoses     Jaw pain    -  Primary   getting x-ray of jaw.  Will determine next steps based on results.   Vitamin D deficiency, unspecified       Checking labs today.  Will continue supplements as needed.   Relevant Orders   CMP14+EGFR (Completed)   CBC with Diff (Completed)   B12 deficiency due to diet       Checking labs today.  Will continue supplements as needed.   Relevant Orders   CMP14+EGFR (Completed)   Vitamin B12 (Completed)   CBC with Diff (Completed)   Other fatigue       Relevant Orders   CMP14+EGFR (Completed)   TSH (Completed)   CBC with Diff (Completed)   Hypothyroidism (acquired)       Relevant Orders   VITAMIN D 25 Hydroxy (Vit-D Deficiency, Fractures) (Completed)   CMP14+EGFR (Completed)   CBC with Diff (Completed)       Return to be determined based on results.   Total time spent: 20 minutes  Miki Kins, FNP  01/30/2023   This document may have been prepared by [facility] Voice Recognition software and as such may include unintentional dictation errors.

## 2023-01-31 ENCOUNTER — Other Ambulatory Visit: Payer: Self-pay | Admitting: Family

## 2023-02-09 ENCOUNTER — Telehealth: Payer: Self-pay | Admitting: Family

## 2023-02-09 NOTE — Telephone Encounter (Signed)
-----   Message from [place] W sent at 02/09/2023 10:27 AM EDT ----- Regarding: Refill Pt daughter needs an refill of antibiotics for her mother. Pt daughter states she also needs lab results as well. Pt daughter states she has left voicemails at nurse stations & no call  back

## 2023-02-12 ENCOUNTER — Ambulatory Visit
Admission: RE | Admit: 2023-02-12 | Discharge: 2023-02-12 | Disposition: A | Discharge status: Home / Self Care | Payer: Medicare Other | Attending: Family | Admitting: Family

## 2023-02-12 ENCOUNTER — Ambulatory Visit
Admission: RE | Admit: 2023-02-12 | Discharge: 2023-02-12 | Disposition: A | Discharge status: Home / Self Care | Payer: Medicare Other | Source: Ambulatory Visit | Attending: Family | Admitting: Family

## 2023-02-12 ENCOUNTER — Other Ambulatory Visit: Payer: Self-pay

## 2023-02-12 DIAGNOSIS — R6884 Jaw pain: Secondary | ICD-10-CM | POA: Insufficient documentation

## 2023-02-12 MED ORDER — LEVOTHYROXINE SODIUM 75 MCG PO TABS: 75.0000 ug | ORAL_TABLET | Freq: Every day | ORAL | 3 refills | Status: DC

## 2023-02-25 ENCOUNTER — Other Ambulatory Visit: Payer: Self-pay | Admitting: Family

## 2023-02-25 ENCOUNTER — Telehealth: Payer: Self-pay

## 2023-02-25 DIAGNOSIS — M1909 Primary osteoarthritis, other specified site: Secondary | ICD-10-CM

## 2023-02-25 MED ORDER — PREDNISONE 20 MG PO TABS: 40.0000 mg | ORAL_TABLET | Freq: Every day | ORAL | 0 refills | Status: DC

## 2023-02-25 NOTE — Progress Notes (Signed)
Patient notified

## 2023-02-25 NOTE — Telephone Encounter (Signed)
Norm Parcel,  Informed her that we are sending the referral.  Also let her know what happened with the results.   I sent in RX for prednisone for her for inflammation.

## 2023-02-27 ENCOUNTER — Encounter: Payer: Self-pay | Admitting: Family

## 2023-02-27 NOTE — Assessment & Plan Note (Signed)
Blood pressure well controlled with current medications.  Continue current therapy.  Will reassess at follow up.  

## 2023-02-27 NOTE — Assessment & Plan Note (Signed)
Checking labs today. Will call pt. With results  Continue current diabetes POC, as patient has been well controlled on current regimen.  Will adjust meds if needed based on labs.  

## 2023-02-27 NOTE — Assessment & Plan Note (Signed)
Checking labs today.  Continue current therapy for lipid control. Will modify as needed based on labwork results.  

## 2023-02-27 NOTE — Assessment & Plan Note (Signed)
Patient stable.  Well controlled with current therapy.   Continue current meds.  

## 2023-03-27 ENCOUNTER — Other Ambulatory Visit: Payer: Self-pay | Admitting: Family

## 2023-03-30 ENCOUNTER — Other Ambulatory Visit: Payer: Self-pay

## 2023-03-31 MED ORDER — ONETOUCH VERIO VI STRP: ORAL_STRIP | 1 refills | Status: AC

## 2023-05-21 ENCOUNTER — Other Ambulatory Visit: Payer: Self-pay | Admitting: Internal Medicine

## 2023-05-21 ENCOUNTER — Other Ambulatory Visit: Payer: Self-pay | Admitting: Family

## 2023-05-21 DIAGNOSIS — N393 Stress incontinence (female) (male): Secondary | ICD-10-CM

## 2023-05-22 ENCOUNTER — Other Ambulatory Visit: Payer: Self-pay | Admitting: Family

## 2023-05-22 MED ORDER — AMITRIPTYLINE HCL 25 MG PO TABS: 25.0000 mg | ORAL_TABLET | Freq: Every day | ORAL | 1 refills | Status: DC

## 2023-07-21 ENCOUNTER — Other Ambulatory Visit: Payer: Self-pay | Admitting: Family

## 2023-07-23 ENCOUNTER — Other Ambulatory Visit: Payer: Self-pay | Admitting: Family

## 2023-07-29 ENCOUNTER — Inpatient Hospital Stay
Admission: EM | Admit: 2023-07-29 | Discharge: 2023-08-06 | DRG: 193 | Disposition: A | Discharge status: Home Health | Payer: Medicare Other | Attending: Internal Medicine | Admitting: Internal Medicine

## 2023-07-29 ENCOUNTER — Emergency Department: Payer: Medicare Other

## 2023-07-29 ENCOUNTER — Other Ambulatory Visit: Payer: Self-pay

## 2023-07-29 DIAGNOSIS — I9589 Other hypotension: Secondary | ICD-10-CM | POA: Diagnosis present

## 2023-07-29 DIAGNOSIS — I959 Hypotension, unspecified: Secondary | ICD-10-CM | POA: Diagnosis present

## 2023-07-29 DIAGNOSIS — G894 Chronic pain syndrome: Secondary | ICD-10-CM | POA: Diagnosis present

## 2023-07-29 DIAGNOSIS — Z8249 Family history of ischemic heart disease and other diseases of the circulatory system: Secondary | ICD-10-CM

## 2023-07-29 DIAGNOSIS — J9602 Acute respiratory failure with hypercapnia: Secondary | ICD-10-CM | POA: Diagnosis present

## 2023-07-29 DIAGNOSIS — Z860101 Personal history of adenomatous and serrated colon polyps: Secondary | ICD-10-CM

## 2023-07-29 DIAGNOSIS — E119 Type 2 diabetes mellitus without complications: Secondary | ICD-10-CM | POA: Diagnosis present

## 2023-07-29 DIAGNOSIS — E038 Other specified hypothyroidism: Secondary | ICD-10-CM | POA: Diagnosis not present

## 2023-07-29 DIAGNOSIS — Z79899 Other long term (current) drug therapy: Secondary | ICD-10-CM

## 2023-07-29 DIAGNOSIS — J1000 Influenza due to other identified influenza virus with unspecified type of pneumonia: Secondary | ICD-10-CM | POA: Diagnosis present

## 2023-07-29 DIAGNOSIS — I480 Paroxysmal atrial fibrillation: Secondary | ICD-10-CM | POA: Diagnosis not present

## 2023-07-29 DIAGNOSIS — F419 Anxiety disorder, unspecified: Secondary | ICD-10-CM | POA: Diagnosis present

## 2023-07-29 DIAGNOSIS — J440 Chronic obstructive pulmonary disease with acute lower respiratory infection: Secondary | ICD-10-CM | POA: Diagnosis present

## 2023-07-29 DIAGNOSIS — Z79890 Hormone replacement therapy (postmenopausal): Secondary | ICD-10-CM

## 2023-07-29 DIAGNOSIS — E785 Hyperlipidemia, unspecified: Secondary | ICD-10-CM | POA: Diagnosis present

## 2023-07-29 DIAGNOSIS — I214 Non-ST elevation (NSTEMI) myocardial infarction: Secondary | ICD-10-CM | POA: Diagnosis not present

## 2023-07-29 DIAGNOSIS — J441 Chronic obstructive pulmonary disease with (acute) exacerbation: Secondary | ICD-10-CM | POA: Diagnosis present

## 2023-07-29 DIAGNOSIS — Z1152 Encounter for screening for COVID-19: Secondary | ICD-10-CM

## 2023-07-29 DIAGNOSIS — N183 Chronic kidney disease, stage 3 unspecified: Secondary | ICD-10-CM | POA: Diagnosis present

## 2023-07-29 DIAGNOSIS — N1831 Chronic kidney disease, stage 3a: Secondary | ICD-10-CM | POA: Diagnosis present

## 2023-07-29 DIAGNOSIS — E039 Hypothyroidism, unspecified: Secondary | ICD-10-CM | POA: Diagnosis present

## 2023-07-29 DIAGNOSIS — R1084 Generalized abdominal pain: Secondary | ICD-10-CM | POA: Diagnosis not present

## 2023-07-29 DIAGNOSIS — E860 Dehydration: Secondary | ICD-10-CM | POA: Diagnosis present

## 2023-07-29 DIAGNOSIS — N179 Acute kidney failure, unspecified: Secondary | ICD-10-CM | POA: Diagnosis present

## 2023-07-29 DIAGNOSIS — E861 Hypovolemia: Secondary | ICD-10-CM | POA: Diagnosis present

## 2023-07-29 DIAGNOSIS — Z95820 Peripheral vascular angioplasty status with implants and grafts: Secondary | ICD-10-CM | POA: Diagnosis not present

## 2023-07-29 DIAGNOSIS — Z7984 Long term (current) use of oral hypoglycemic drugs: Secondary | ICD-10-CM

## 2023-07-29 DIAGNOSIS — N1832 Chronic kidney disease, stage 3b: Secondary | ICD-10-CM

## 2023-07-29 DIAGNOSIS — E871 Hypo-osmolality and hyponatremia: Secondary | ICD-10-CM | POA: Diagnosis present

## 2023-07-29 DIAGNOSIS — Z7902 Long term (current) use of antithrombotics/antiplatelets: Secondary | ICD-10-CM

## 2023-07-29 DIAGNOSIS — R0602 Shortness of breath: Secondary | ICD-10-CM | POA: Diagnosis present

## 2023-07-29 DIAGNOSIS — I4891 Unspecified atrial fibrillation: Secondary | ICD-10-CM | POA: Diagnosis present

## 2023-07-29 DIAGNOSIS — E875 Hyperkalemia: Secondary | ICD-10-CM | POA: Diagnosis present

## 2023-07-29 DIAGNOSIS — B370 Candidal stomatitis: Secondary | ICD-10-CM | POA: Diagnosis not present

## 2023-07-29 DIAGNOSIS — Z7983 Long term (current) use of bisphosphonates: Secondary | ICD-10-CM

## 2023-07-29 DIAGNOSIS — I10 Essential (primary) hypertension: Secondary | ICD-10-CM | POA: Diagnosis not present

## 2023-07-29 DIAGNOSIS — J9601 Acute respiratory failure with hypoxia: Secondary | ICD-10-CM | POA: Diagnosis present

## 2023-07-29 DIAGNOSIS — Z7901 Long term (current) use of anticoagulants: Secondary | ICD-10-CM

## 2023-07-29 DIAGNOSIS — H409 Unspecified glaucoma: Secondary | ICD-10-CM | POA: Diagnosis present

## 2023-07-29 DIAGNOSIS — Z9049 Acquired absence of other specified parts of digestive tract: Secondary | ICD-10-CM

## 2023-07-29 DIAGNOSIS — J101 Influenza due to other identified influenza virus with other respiratory manifestations: Secondary | ICD-10-CM | POA: Diagnosis not present

## 2023-07-29 DIAGNOSIS — F17290 Nicotine dependence, other tobacco product, uncomplicated: Secondary | ICD-10-CM | POA: Diagnosis present

## 2023-07-29 DIAGNOSIS — I129 Hypertensive chronic kidney disease with stage 1 through stage 4 chronic kidney disease, or unspecified chronic kidney disease: Secondary | ICD-10-CM | POA: Diagnosis present

## 2023-07-29 DIAGNOSIS — A419 Sepsis, unspecified organism: Secondary | ICD-10-CM | POA: Diagnosis present

## 2023-07-29 DIAGNOSIS — R0902 Hypoxemia: Secondary | ICD-10-CM

## 2023-07-29 DIAGNOSIS — R6520 Severe sepsis without septic shock: Secondary | ICD-10-CM

## 2023-07-29 DIAGNOSIS — I482 Chronic atrial fibrillation, unspecified: Secondary | ICD-10-CM | POA: Diagnosis not present

## 2023-07-29 DIAGNOSIS — I5A Non-ischemic myocardial injury (non-traumatic): Secondary | ICD-10-CM | POA: Diagnosis present

## 2023-07-29 DIAGNOSIS — E1122 Type 2 diabetes mellitus with diabetic chronic kidney disease: Secondary | ICD-10-CM | POA: Diagnosis present

## 2023-07-29 LAB — BLOOD GAS, VENOUS
Acid-base deficit: 5.7 mmol/L — ABNORMAL HIGH (ref 0.0–2.0)
Acid-base deficit: 8.6 mmol/L — ABNORMAL HIGH (ref 0.0–2.0)
Bicarbonate: 21.5 mmol/L (ref 20.0–28.0)
Bicarbonate: 18.8 mmol/L — ABNORMAL LOW (ref 20.0–28.0)
O2 Saturation: 71.4 %
O2 Saturation: 92.3 %
Patient temperature: 37
Patient temperature: 37
pCO2, Ven: 48 mm[Hg] (ref 44–60)
pCO2, Ven: 45 mm[Hg] (ref 44–60)
pH, Ven: 7.26 (ref 7.25–7.43)
pH, Ven: 7.23 — ABNORMAL LOW (ref 7.25–7.43)
pO2, Ven: 42 mm[Hg] (ref 32–45)
pO2, Ven: 66 mm[Hg] — ABNORMAL HIGH (ref 32–45)

## 2023-07-29 LAB — COMPREHENSIVE METABOLIC PANEL
ALT: 35 U/L (ref 0–44)
AST: 50 U/L — ABNORMAL HIGH (ref 15–41)
Albumin: 3.4 g/dL — ABNORMAL LOW (ref 3.5–5.0)
Alkaline Phosphatase: 106 U/L (ref 38–126)
Anion gap: 15 (ref 5–15)
BUN: 37 mg/dL — ABNORMAL HIGH (ref 8–23)
CO2: 18 mmol/L — ABNORMAL LOW (ref 22–32)
Calcium: 8 mg/dL — ABNORMAL LOW (ref 8.9–10.3)
Chloride: 96 mmol/L — ABNORMAL LOW (ref 98–111)
Creatinine, Ser: 2.7 mg/dL — ABNORMAL HIGH (ref 0.44–1.00)
GFR, Estimated: 18 mL/min — ABNORMAL LOW (ref >60)
Glucose, Bld: 241 mg/dL — ABNORMAL HIGH (ref 70–99)
Potassium: 3.9 mmol/L (ref 3.5–5.1)
Sodium: 129 mmol/L — ABNORMAL LOW (ref 135–145)
Total Bilirubin: 0.6 mg/dL (ref 0.0–1.2)
Total Protein: 6.1 g/dL — ABNORMAL LOW (ref 6.5–8.1)

## 2023-07-29 LAB — CBC WITH DIFFERENTIAL/PLATELET
Abs Immature Granulocytes: 0.02 10*3/uL (ref 0.00–0.07)
Basophils Absolute: 0 10*3/uL (ref 0.0–0.1)
Basophils Relative: 0 %
Eosinophils Absolute: 0 10*3/uL (ref 0.0–0.5)
Eosinophils Relative: 0 %
HCT: 36.5 % (ref 36.0–46.0)
Hemoglobin: 12 g/dL (ref 12.0–15.0)
Immature Granulocytes: 0 %
Lymphocytes Relative: 14 %
Lymphs Abs: 1 10*3/uL (ref 0.7–4.0)
MCH: 31.4 pg (ref 26.0–34.0)
MCHC: 32.9 g/dL (ref 30.0–36.0)
MCV: 95.5 fL (ref 80.0–100.0)
Monocytes Absolute: 0.6 10*3/uL (ref 0.1–1.0)
Monocytes Relative: 9 %
Neutro Abs: 5.1 10*3/uL (ref 1.7–7.7)
Neutrophils Relative %: 77 %
Platelets: 186 10*3/uL (ref 150–400)
RBC: 3.82 MIL/uL — ABNORMAL LOW (ref 3.87–5.11)
RDW: 13.4 % (ref 11.5–15.5)
WBC: 6.7 10*3/uL (ref 4.0–10.5)
nRBC: 0 % (ref 0.0–0.2)

## 2023-07-29 LAB — BASIC METABOLIC PANEL
Anion gap: 15 (ref 5–15)
BUN: 40 mg/dL — ABNORMAL HIGH (ref 8–23)
CO2: 17 mmol/L — ABNORMAL LOW (ref 22–32)
Calcium: 7.3 mg/dL — ABNORMAL LOW (ref 8.9–10.3)
Chloride: 98 mmol/L (ref 98–111)
Creatinine, Ser: 3.02 mg/dL — ABNORMAL HIGH (ref 0.44–1.00)
GFR, Estimated: 16 mL/min — ABNORMAL LOW (ref >60)
Glucose, Bld: 269 mg/dL — ABNORMAL HIGH (ref 70–99)
Potassium: 3.7 mmol/L (ref 3.5–5.1)
Sodium: 130 mmol/L — ABNORMAL LOW (ref 135–145)

## 2023-07-29 LAB — TROPONIN I (HIGH SENSITIVITY)
Troponin I (High Sensitivity): 357 ng/L (ref <18)
Troponin I (High Sensitivity): 478 ng/L (ref <18)
Troponin I (High Sensitivity): 359 ng/L (ref <18)

## 2023-07-29 LAB — RESP PANEL BY RT-PCR (RSV, FLU A&B, COVID)  RVPGX2
Influenza A by PCR: POSITIVE — AB
Influenza B by PCR: NEGATIVE
Resp Syncytial Virus by PCR: NEGATIVE
SARS Coronavirus 2 by RT PCR: NEGATIVE

## 2023-07-29 LAB — CBG MONITORING, ED
Glucose-Capillary: 271 mg/dL — ABNORMAL HIGH (ref 70–99)
Glucose-Capillary: 214 mg/dL — ABNORMAL HIGH (ref 70–99)

## 2023-07-29 LAB — PROTIME-INR
INR: 1.8 — ABNORMAL HIGH (ref 0.8–1.2)
Prothrombin Time: 20.8 s — ABNORMAL HIGH (ref 11.4–15.2)

## 2023-07-29 LAB — LACTIC ACID, PLASMA
Lactic Acid, Venous: 2 mmol/L (ref 0.5–1.9)
Lactic Acid, Venous: 2.7 mmol/L (ref 0.5–1.9)
Lactic Acid, Venous: 2 mmol/L (ref 0.5–1.9)

## 2023-07-29 LAB — TSH: TSH: 0.058 u[IU]/mL — ABNORMAL LOW (ref 0.350–4.500)

## 2023-07-29 LAB — LIPASE, BLOOD: Lipase: 21 U/L (ref 11–51)

## 2023-07-29 LAB — BRAIN NATRIURETIC PEPTIDE: B Natriuretic Peptide: 425.1 pg/mL — ABNORMAL HIGH (ref 0.0–100.0)

## 2023-07-29 LAB — PROCALCITONIN: Procalcitonin: 0.19 ng/mL

## 2023-07-29 LAB — HEPARIN LEVEL (UNFRACTIONATED): Heparin Unfractionated: >1.1 [IU]/mL — ABNORMAL HIGH (ref 0.30–0.70)

## 2023-07-29 LAB — APTT: aPTT: 47 s — ABNORMAL HIGH (ref 24–36)

## 2023-07-29 MED ORDER — SODIUM CHLORIDE 0.9 % IV SOLN
500.0000 mg | Freq: Once | INTRAVENOUS | Status: AC
  Administered 2023-07-29: 500 mg via INTRAVENOUS
  Filled 2023-07-29: qty 5

## 2023-07-29 MED ORDER — HEPARIN (PORCINE) 25000 UT/250ML-% IV SOLN
1250.0000 [IU]/h | INTRAVENOUS | Status: DC
  Administered 2023-07-29: 900 [IU]/h via INTRAVENOUS
  Administered 2023-07-31: 1050 [IU]/h via INTRAVENOUS
  Administered 2023-08-01: 1250 [IU]/h via INTRAVENOUS
  Filled 2023-07-29 (×3): qty 250

## 2023-07-29 MED ORDER — RIFAXIMIN 550 MG PO TABS
550.0000 mg | ORAL_TABLET | Freq: Two times a day (BID) | ORAL | Status: DC
  Administered 2023-07-29 – 2023-08-06 (×16): 550 mg via ORAL
  Filled 2023-07-29 (×17): qty 1

## 2023-07-29 MED ORDER — AMITRIPTYLINE HCL 25 MG PO TABS
25.0000 mg | ORAL_TABLET | Freq: Every day | ORAL | Status: DC
  Administered 2023-07-29 – 2023-08-05 (×8): 25 mg via ORAL
  Filled 2023-07-29 (×10): qty 1

## 2023-07-29 MED ORDER — ONDANSETRON HCL 4 MG/2ML IJ SOLN
4.0000 mg | Freq: Once | INTRAMUSCULAR | Status: AC
  Administered 2023-07-29: 4 mg via INTRAVENOUS
  Filled 2023-07-29: qty 2

## 2023-07-29 MED ORDER — BUDESONIDE 0.5 MG/2ML IN SUSP
0.5000 mg | Freq: Two times a day (BID) | RESPIRATORY_TRACT | Status: DC
  Administered 2023-07-29 – 2023-07-31 (×4): 0.5 mg via RESPIRATORY_TRACT
  Filled 2023-07-29 (×4): qty 2

## 2023-07-29 MED ORDER — CITALOPRAM HYDROBROMIDE 20 MG PO TABS
20.0000 mg | ORAL_TABLET | Freq: Every day | ORAL | Status: DC
  Administered 2023-07-30 – 2023-08-06 (×8): 20 mg via ORAL
  Filled 2023-07-29 (×8): qty 1

## 2023-07-29 MED ORDER — LACTATED RINGERS IV SOLN: INTRAVENOUS | Status: AC

## 2023-07-29 MED ORDER — LEVOTHYROXINE SODIUM 50 MCG PO TABS
75.0000 ug | ORAL_TABLET | Freq: Every day | ORAL | Status: DC
  Administered 2023-07-30: 75 ug via ORAL
  Filled 2023-07-29: qty 2

## 2023-07-29 MED ORDER — FESOTERODINE FUMARATE ER 4 MG PO TB24
4.0000 mg | ORAL_TABLET | Freq: Every day | ORAL | Status: DC
  Administered 2023-07-30 – 2023-08-06 (×8): 4 mg via ORAL
  Filled 2023-07-29 (×8): qty 1

## 2023-07-29 MED ORDER — ACETAMINOPHEN 325 MG PO TABS
650.0000 mg | ORAL_TABLET | Freq: Four times a day (QID) | ORAL | Status: DC | PRN
  Administered 2023-07-30: 650 mg via ORAL
  Filled 2023-07-29: qty 2

## 2023-07-29 MED ORDER — IPRATROPIUM-ALBUTEROL 0.5-2.5 (3) MG/3ML IN SOLN
9.0000 mL | Freq: Once | RESPIRATORY_TRACT | Status: AC
Start: 2023-07-29 — End: 2023-07-29
  Administered 2023-07-29: 9 mL via RESPIRATORY_TRACT
  Filled 2023-07-29: qty 9

## 2023-07-29 MED ORDER — METHYLPREDNISOLONE SODIUM SUCC 40 MG IJ SOLR
40.0000 mg | Freq: Every day | INTRAMUSCULAR | Status: DC
  Administered 2023-07-30 – 2023-08-03 (×5): 40 mg via INTRAVENOUS
  Filled 2023-07-29 (×5): qty 1

## 2023-07-29 MED ORDER — ROSUVASTATIN CALCIUM 10 MG PO TABS
20.0000 mg | ORAL_TABLET | Freq: Every day | ORAL | Status: DC
  Administered 2023-07-29 – 2023-08-05 (×8): 20 mg via ORAL
  Filled 2023-07-29 (×3): qty 2
  Filled 2023-07-29: qty 1
  Filled 2023-07-29 (×3): qty 2
  Filled 2023-07-29 (×2): qty 1
  Filled 2023-07-29: qty 2

## 2023-07-29 MED ORDER — ONDANSETRON HCL 4 MG PO TABS: 4.0000 mg | ORAL_TABLET | Freq: Four times a day (QID) | ORAL | Status: DC | PRN

## 2023-07-29 MED ORDER — FENTANYL 75 MCG/HR TD PT72
1.0000 | MEDICATED_PATCH | TRANSDERMAL | Status: DC
  Administered 2023-07-31 – 2023-08-06 (×3): 1 via TRANSDERMAL
  Filled 2023-07-29 (×4): qty 1

## 2023-07-29 MED ORDER — ALBUTEROL SULFATE (2.5 MG/3ML) 0.083% IN NEBU: 2.5000 mg | INHALATION_SOLUTION | RESPIRATORY_TRACT | Status: DC | PRN

## 2023-07-29 MED ORDER — SODIUM CHLORIDE 0.9 % IV BOLUS
1000.0000 mL | Freq: Once | INTRAVENOUS | Status: AC
  Administered 2023-07-29: 1000 mL via INTRAVENOUS

## 2023-07-29 MED ORDER — CEFTRIAXONE SODIUM 2 G IJ SOLR
2.0000 g | Freq: Once | INTRAMUSCULAR | Status: AC
  Administered 2023-07-29: 2 g via INTRAVENOUS
  Filled 2023-07-29: qty 20

## 2023-07-29 MED ORDER — FUROSEMIDE 10 MG/ML IJ SOLN
40.0000 mg | Freq: Once | INTRAMUSCULAR | Status: AC
  Administered 2023-07-29: 40 mg via INTRAVENOUS
  Filled 2023-07-29: qty 4

## 2023-07-29 MED ORDER — INSULIN ASPART 100 UNIT/ML IJ SOLN
0.0000 [IU] | Freq: Three times a day (TID) | INTRAMUSCULAR | Status: DC
  Administered 2023-07-29: 8 [IU] via SUBCUTANEOUS
  Administered 2023-07-30: 5 [IU] via SUBCUTANEOUS
  Administered 2023-07-30: 3 [IU] via SUBCUTANEOUS
  Administered 2023-07-30: 5 [IU] via SUBCUTANEOUS
  Administered 2023-07-31: 3 [IU] via SUBCUTANEOUS
  Administered 2023-07-31 – 2023-08-01 (×2): 5 [IU] via SUBCUTANEOUS
  Administered 2023-08-01: 3 [IU] via SUBCUTANEOUS
  Administered 2023-08-01: 5 [IU] via SUBCUTANEOUS
  Administered 2023-08-02 (×2): 3 [IU] via SUBCUTANEOUS
  Administered 2023-08-02: 5 [IU] via SUBCUTANEOUS
  Administered 2023-08-03: 8 [IU] via SUBCUTANEOUS
  Administered 2023-08-03: 5 [IU] via SUBCUTANEOUS
  Administered 2023-08-04: 3 [IU] via SUBCUTANEOUS
  Administered 2023-08-05: 2 [IU] via SUBCUTANEOUS
  Administered 2023-08-05: 8 [IU] via SUBCUTANEOUS
  Administered 2023-08-05: 3 [IU] via SUBCUTANEOUS
  Administered 2023-08-06: 5 [IU] via SUBCUTANEOUS
  Filled 2023-07-29 (×20): qty 1

## 2023-07-29 MED ORDER — OSELTAMIVIR PHOSPHATE 30 MG PO CAPS
30.0000 mg | ORAL_CAPSULE | Freq: Every day | ORAL | Status: AC
  Administered 2023-07-29 – 2023-08-02 (×5): 30 mg via ORAL
  Filled 2023-07-29 (×6): qty 1

## 2023-07-29 MED ORDER — INSULIN GLARGINE-YFGN 100 UNIT/ML ~~LOC~~ SOLN
5.0000 [IU] | Freq: Every day | SUBCUTANEOUS | Status: DC
  Administered 2023-07-29 – 2023-08-05 (×8): 5 [IU] via SUBCUTANEOUS
  Filled 2023-07-29 (×10): qty 0.05

## 2023-07-29 MED ORDER — ACETAMINOPHEN 325 MG RE SUPP: 650.0000 mg | Freq: Four times a day (QID) | RECTAL | Status: DC | PRN

## 2023-07-29 MED ORDER — SODIUM CHLORIDE 0.9% FLUSH
3.0000 mL | Freq: Two times a day (BID) | INTRAVENOUS | Status: DC
  Administered 2023-07-29 – 2023-08-06 (×14): 3 mL via INTRAVENOUS

## 2023-07-29 MED ORDER — IPRATROPIUM-ALBUTEROL 0.5-2.5 (3) MG/3ML IN SOLN
3.0000 mL | Freq: Four times a day (QID) | RESPIRATORY_TRACT | Status: DC
  Administered 2023-07-29 – 2023-08-02 (×16): 3 mL via RESPIRATORY_TRACT
  Filled 2023-07-29 (×16): qty 3

## 2023-07-29 MED ORDER — ONDANSETRON HCL 4 MG/2ML IJ SOLN
4.0000 mg | Freq: Four times a day (QID) | INTRAMUSCULAR | Status: DC | PRN
  Administered 2023-07-30: 4 mg via INTRAVENOUS
  Filled 2023-07-29: qty 2

## 2023-07-29 NOTE — Assessment & Plan Note (Deleted)
Strongly suspect demand in the absence of chest pain and presence of profound hypoxia.  EKG with nonspecific changes only.  Heparin infusion has already been initiated and cardiology was consulted in the ED.  - Cardiology consulted; appreciate their recommendations - Echocardiogram ordered - Continue heparin infusion for now - Trend troponin to peak

## 2023-07-29 NOTE — Sepsis Progress Note (Signed)
eLink is following this Code Sepsis.

## 2023-07-29 NOTE — Assessment & Plan Note (Addendum)
Improved

## 2023-07-29 NOTE — Consult Note (Signed)
CODE SEPSIS - PHARMACY COMMUNICATION  **Broad-spectrum antimicrobials should be administered within one hour of sepsis diagnosis**  Time Code Sepsis call or page was received: 1542  Antibiotics ordered: Ceftriaxone, Azithromycin  Time of first antibiotic administration: 1614  Additional action taken by pharmacy: N/A  If necessary, name of provider/nurse contacted: N/A    Will M. Dareen Piano, PharmD Clinical Pharmacist 07/29/2023 5:11 PM

## 2023-07-29 NOTE — Assessment & Plan Note (Addendum)
Secondary to influenza A. Switch Solu-Medrol over to prednisone Continue DuoNeb

## 2023-07-29 NOTE — Assessment & Plan Note (Signed)
PDMP reviewed.  Patient is currently receiving 10 fentanyl patches 75 mcg/h and oxycodone 30 tablets per month.  - Continue home fentanyl patch - Hold oral oxycodone for severe pain

## 2023-07-29 NOTE — Assessment & Plan Note (Signed)
Hemoglobin A1c 7.3.  On low-dose Semglee insulin and sliding scale.

## 2023-07-29 NOTE — ED Triage Notes (Signed)
BIBEMS, c/o SOB that started yesterday. Reports nausea, cough. Denies emesis or fevers. O2 sats were 70%, placed on 15L, O2 increased to 89-90%. Given duoneb/albuterol to 92% on 10L, 2g of mag and 125mg  solumedrol IV. GCS 15 @baseline , sluggish in response to questions. VSS otherwise. PMH: CODP, no O2 baseline

## 2023-07-29 NOTE — Assessment & Plan Note (Addendum)
With pneumonia, Influenza A PCR positive. Completed Tamiflu Sepsis ruled out did not meet all of the SIRS criteria.

## 2023-07-29 NOTE — Assessment & Plan Note (Addendum)
On chronic kidney disease stage IIIa.  Creatinine peaked at 3.02.  Today's creatinine down to 1.02.

## 2023-07-29 NOTE — Consult Note (Signed)
Pharmacy Consult Note - Anticoagulation  Pharmacy Consult for heparin Indication: chest pain/ACS  PATIENT MEASUREMENTS: Height: 5\' 4"  (162.6 cm) Weight: 97.5 kg (215 lb) IBW/kg (Calculated) : 54.7 HEPARIN DW (KG): 77.1  VITAL SIGNS: Temp: 98.1 F (36.7 C) (01/29 1640) Temp Source: Oral (01/29 1640) BP: 170/115 (01/29 1455) Pulse Rate: 61 (01/29 1621)  Recent Labs    07/29/23 1341  HGB 12.0  HCT 36.5  PLT 186  APTT 47*  LABPROT 20.8*  INR 1.8*  CREATININE 2.70*  TROPONINIHS 357*    Estimated Creatinine Clearance: 20.4 mL/min (A) (by C-G formula based on SCr of 2.7 mg/dL (H)).  PAST MEDICAL HISTORY: Past Medical History:  Diagnosis Date   Arthritis    Atypical squamous cells of undetermined significance (ASCUS) on Papanicolaou smear of cervix 10/08/2016   Chest pain 09/20/2020   COPD (chronic obstructive pulmonary disease) (HCC)    Diabetes mellitus without complication (HCC)    Diarrhea 01/09/2021   Elevated troponin    Glaucoma 2010   History of adenomatous polyp of colon    Hypertension 2005   Hypomagnesemia 01/09/2021   Knee pain 05/06/2022   Low back pain of over 3 months duration 05/06/2022   Personal history of colonic polyps    Respiratory distress 10/25/2015   Sepsis (HCC) 01/09/2021   Thyroid cyst    UTI (urinary tract infection) 01/09/2021   Wheezing 10/25/2015    ASSESSMENT: 78 y.o. female with PMH including Afib, COPD, HTN2 is presenting with elevated troponins, concerning for ACS. Patient is on chronic anticoagulation with Eliquis for Afib per chart review, last dose was this morning. CHA2DS2VASc is at least 25 (age +31, sex, HTN). Pharmacy has been consulted to initiate and manage heparin intravenous infusion.  Pertinent medications: Eliquis 5 mg twice daily, last dose was this morning  Goal(s) of therapy: Heparin level 0.3 - 0.7 units/mL aPTT 66 - 102 seconds Monitor platelets by anticoagulation protocol: Yes   Baseline anticoagulation  labs: Recent Labs    07/29/23 1341  APTT 47*  INR 1.8*  HGB 12.0  PLT 186    Date Time aPTT/HL Rate/Comment    PLAN: Start heparin infusion at 900 units/hour. No bolus. Check aPTT in 8 hours. Continue to titrate by aPTT until heparin level and aPTT correlate and/or Eliquis washes out, then titrate by heparin level alone. Check heparin level with next AM labs. Continue to monitor CBC daily while on heparin infusion.   Will M. Dareen Piano, PharmD Clinical Pharmacist 07/29/2023 4:57 PM

## 2023-07-29 NOTE — Assessment & Plan Note (Addendum)
Severe hypoxia with oxygen saturation as low as 70% and mild hypercapnia with pCO2 48 in the setting of COPD exacerbation, influenza A, and dehydration.    Patient still on high flow nasal cannula 8 L as of this morning.

## 2023-07-29 NOTE — ED Notes (Signed)
Fentanyl patch removed at this time, pressure has not improved but MAP is above 65. MD Huel Cote aware

## 2023-07-29 NOTE — H&P (Addendum)
History and Physical    Patient: Stephanie Small ZOX:[id number] DOB: [birth year] DOA: 07/29/2023 DOS: the patient was seen and examined on 07/29/2023 PCP: Miki Kins, FNP  Patient coming from: Home  Chief Complaint:  Chief Complaint  Patient presents with   Shortness of Breath   HPI: Stephanie Small is a 78 y.o. female with medical history significant of COPD, chronic pain syndrome, hypothyroidism, type 2 diabetes, hypertension, hyperlipidemia, who presents to the ED due to shortness of breath.  [name] states that she has been feeling unwell for approximately 1 week after attending her brother's funeral where many other people were sick.  She endorses generalized malaise, fever, chills, nausea, vomiting, poor p.o. intake and productive cough.  Then in the last 24 hours, she has developed shortness of breath that has quickly worsened.  She denies any chest pain, palpitations but endorses lower extremity edema.  One of her daughters came to check on her today and noticed that she seemed very confused and was not able to speak coherently.  Due to this, EMS was called.  ED course: On arrival to the ED, patient was normotensive at 127/67 with heart rate of 85.  She was saturating at 89% on 12 L of high flow nasal cannula.  Initial workup notable for normal WBC at 6.7, sodium 129, bicarb 18, glucose 241, BUN 37, creatinine 2.70, AST 50, and GFR of 18.  Troponin elevated at 357 and BNP elevated at 425.  Lactic acid 2.0 with increased to 2.7.  Procalcitonin 0.19.  INR 1.8.  Cardiology was consulted and patient placed on heparin infusion.  Patient also started on azithromycin, ceftriaxone, DuoNebs, and Solu-Medrol.  TRH contacted patient.   Review of Systems: As mentioned in the history of present illness. All other systems reviewed and are negative.  Past Medical History:  Diagnosis Date   Arthritis    Atypical squamous cells of undetermined significance (ASCUS) on Papanicolaou  smear of cervix 10/08/2016   Chest pain 09/20/2020   COPD (chronic obstructive pulmonary disease) (HCC)    Diabetes mellitus without complication (HCC)    Diarrhea 01/09/2021   Elevated troponin    Glaucoma 2010   History of adenomatous polyp of colon    Hypertension 2005   Hypomagnesemia 01/09/2021   Knee pain 05/06/2022   Low back pain of over 3 months duration 05/06/2022   Personal history of colonic polyps    Respiratory distress 10/25/2015   Sepsis (HCC) 01/09/2021   Thyroid cyst    UTI (urinary tract infection) 01/09/2021   Wheezing 10/25/2015   Past Surgical History:  Procedure Laterality Date   CAROTID STENT INSERTION  2011   CHOLECYSTECTOMY  2009   COLONOSCOPY  2007   COLONOSCOPY WITH PROPOFOL N/A 08/18/2016   Procedure: COLONOSCOPY WITH PROPOFOL;  Surgeon: Midge Minium, MD;  Location: [facility];  Service: Endoscopy;  Laterality: N/A;   FOOT SURGERY     SHOULDER SURGERY Bilateral 2004   UPPER GI ENDOSCOPY     Social History:  reports that she has been smoking e-cigarettes. She has never used smokeless tobacco. She reports that she does not drink alcohol and does not use drugs.  Allergies  Allergen Reactions   Shellfish-Derived Products Anaphylaxis and Rash    Allergic to oysters, not sure if she's allergic to shellfish.  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   Shellfish Allergy Rash    Family History  Problem Relation Age of Onset   Hypertension  Mother     Prior to Admission medications   Medication Sig Start Date End Date Taking? Authorizing Provider  lidocaine (LIDODERM) 5 % Place 1 patch onto the skin daily. 06/16/23  Yes [provider]  ondansetron (ZOFRAN) 4 MG tablet Take 4 mg by mouth every 8 (eight) hours as needed. 07/28/23  Yes [provider]  albuterol (PROVENTIL HFA;VENTOLIN HFA) 108 (90 Base) MCG/ACT inhaler Inhale into the lungs every 6 (six) hours as needed for wheezing or shortness of breath.     [provider]  alendronate (FOSAMAX) 70 MG tablet Take 70 mg by mouth once a week. 11/28/20   [provider]  amitriptyline (ELAVIL) 25 MG tablet Take 1 tablet (25 mg total) by mouth at bedtime. 05/22/23   Miki Kins, FNP  amoxicillin-clavulanate (AUGMENTIN) 875-125 MG tablet Take 1 tablet by mouth 2 (two) times daily. 12/23/22   Miki Kins, FNP  apixaban (ELIQUIS) 5 MG TABS tablet TAKE 1 TABLET BY MOUTH TWICE DAILY 07/23/23   Margaretann Loveless, MD  cholestyramine Lanetta Inch) 4 g packet Take 1 packet by mouth daily. 01/03/21   [provider]  citalopram (CELEXA) 20 MG tablet Take 20 mg by mouth daily.    [provider]  clopidogrel (PLAVIX) 75 MG tablet Take 75 mg by mouth daily.    [provider]  cyclobenzaprine (FLEXERIL) 5 MG tablet Take 5 mg by mouth 3 (three) times daily as needed for muscle spasms. 11/23/20   [provider]  diclofenac Sodium (VOLTAREN) 1 % GEL Apply 1 Application topically 4 (four) times daily.    [provider]  fentaNYL (DURAGESIC) 75 MCG/HR Place 1 patch onto the skin every 3 (three) days.    [provider]  fluticasone furoate-vilanterol (BREO ELLIPTA) 100-25 MCG/INH AEPB Inhale 1 puff into the lungs daily.    [provider]  furosemide (LASIX) 20 MG tablet TAKE 1 TABLET BY MOUTH ONCE DAILY 11/17/22   Margaretann Loveless, MD  furosemide (LASIX) 40 MG tablet Take 40 mg by mouth daily. 12/24/20   [provider]  gabapentin (NEURONTIN) 400 MG capsule Take 400 mg by mouth 3 (three) times daily.    [provider]  glucose blood (ONETOUCH VERIO) test strip Use as instructed 03/31/23   Miki Kins, FNP  isosorbide mononitrate (IMDUR) 30 MG 24 hr tablet Take 30 mg by mouth daily.    [provider]  levothyroxine (SYNTHROID) 75 MCG tablet TAKE 1 TABLET BY MOUTH ONCE DAILY ON AN EMPTY STOMACH. WAIT 30 MINUTES BEFORE TAKING OTHER MEDS. 07/23/23   Margaretann Loveless, MD  loperamide (IMODIUM) 2 MG capsule Take 1 capsule (2 mg total) by mouth as needed for diarrhea or loose stools. 09/21/20   Arnetha Courser, MD  losartan (COZAAR) 50 MG tablet Take 50 mg by mouth daily. 02/18/23   [provider]  naloxone [facility]) nasal spray 4 mg/0.1 mL Place 1 spray into the nose once.    [provider]  omeprazole (PRILOSEC) 40 MG capsule Take 40 mg by mouth daily. 12/25/20   [provider]  oxyCODONE (OXYCONTIN) 60 MG 12 hr tablet Take by mouth 3 (three) times daily.    [provider]  Oxycodone HCl 10 MG TABS Take 10 mg by mouth daily.    [provider]  predniSONE (DELTASONE) 20 MG tablet Take 2 tablets (40 mg total) by mouth daily with breakfast. 02/25/23   Miki Kins, FNP  rosuvastatin (CRESTOR) 20 MG tablet Take 20 mg by mouth at bedtime. 10/31/20   [provider]  saxagliptin HCl (ONGLYZA) 2.5 MG TABS tablet Take 2.5 mg by mouth daily.    [provider]  sitaGLIPtin (JANUVIA) 100 MG tablet TAKE 1 TABLET BY MOUTH ONCE DAILY 07/23/23   Margaretann Loveless, MD  SUMAtriptan (IMITREX) 100 MG tablet TAKE 1 TABLET BY MOUTH AS NEEDED FOR HEADACHE. MAX 2 TABLETS PER DAY 02/02/23   Miki Kins, FNP  tolterodine (DETROL LA) 2 MG 24 hr capsule TAKE 1 CAPSULE BY MOUTH ONCE DAILY 05/21/23   Margaretann Loveless, MD  vitamin C (ASCORBIC ACID) 500 MG tablet Take 500 mg by mouth daily.    [provider]  XIFAXAN 550 MG TABS tablet Take 550 mg by mouth 2 (two) times daily. 11/19/22   [provider]    Physical Exam: Vitals:   07/29/23 1621 07/29/23 1640 07/29/23 1641 07/29/23 1735  BP:    (!) 87/70  Pulse: 61   96  Resp: (!) 22   (!) 21  Temp:  98.1 F (36.7 C)    TempSrc:  Oral    SpO2: 93%   91%  Weight:   97.5 kg   Height:   5\' 4"  (1.626 m)    Physical Exam Vitals and nursing note reviewed.  Constitutional:      Appearance: She is obese. She is ill-appearing.  HENT:     Head:  Normocephalic and atraumatic.     Mouth/Throat:     Mouth: Mucous membranes are dry.     Pharynx: Oropharynx is clear.     Comments: No dentures Eyes:     Extraocular Movements: Extraocular movements intact.     Pupils: Pupils are equal, round, and reactive to light.  Cardiovascular:     Rate and Rhythm: Normal rate and regular rhythm.     Heart sounds: No murmur heard. Pulmonary:     Effort: Tachypnea and accessory muscle usage present.     Breath sounds: Wheezing (Diffuse) and rales (On the left base) present.  Abdominal:     General: Bowel sounds are normal. There is no distension.     Palpations: Abdomen is soft.     Tenderness: There is no abdominal tenderness. There is no guarding.  Musculoskeletal:     Right lower leg: No edema.     Left lower leg: No edema.  Skin:    General: Skin is warm and dry.  Neurological:     Mental Status: She is alert.     Comments:  Patient is alert and oriented x 4. Initial concern for slurred speech, however likely due to missing her teeth, (patient did not place her dentures in).  Cranial nerves II through XII otherwise intact. Some difficulty following instructions, specifically on testing extraocular movements 5 out of 5 strength throughout  Psychiatric:        Mood and Affect: Mood normal.        Behavior: Behavior normal.    Data Reviewed: CBC with WBC of 6.7, hemoglobin of 12.0, platelets 186 CMP with sodium of 129, potassium 3.9, bicarb 18, anion gap 15, BUN 37, creatinine 2.70, AST 50, ALT 35, GFR of 18 Glucose 241 Troponin 357 BNP 425 Lactic acid 2.0 with increase to 2.7 Procalcitonin 0.19 INR 1.8 PTT 47  EKG personally reviewed.  Sinus rhythm with a rate of 87.  Nonspecific T wave inversions in leads V2-4.  Otherwise, no acute ischemic changes.  QTc prolonged at 500.  DG Chest Port 1 View Result Date: 07/29/2023 CLINICAL DATA:  Sepsis EXAM: PORTABLE CHEST 1 VIEW COMPARISON:  X-ray and CTA 07/26/2021.  Older x-rays as well  FINDINGS: Enlarged cardiopericardial silhouette. Calcified aorta. Chronic interstitial changes with some hyperinflation. Slight prominence of central vasculature. No pneumothorax, effusion or consolidation. Overlapping cardiac leads. Curvature and degenerative changes of the spine. IMPRESSION: Enlarged cardiopericardial silhouette. Likely chronic appearing lung changes. Electronically Signed   By: Karen Kays M.D.   On: 07/29/2023 15:09   Results are pending, will review when available.  Assessment and Plan:  * Acute respiratory failure with hypoxia and hypercapnia (HCC) Severe hypoxia with oxygen saturation as low as 70% and mild hypercapnia with pCO2 48 in the setting of COPD exacerbation, influenza A, and dehydration.  Initially considered PE given elevated cardiac enzymes, however patient has been compliant with her home Eliquis, so quite unlikely.  - Continue heated high flow with low threshold to transition to BiPAP - Wean as tolerated - Hold off on V/Q scan at this time  Sepsis [facility]) Patient is presenting with severe hypoxia and tachypnea, increasing lactic acid and subsequent hypotension (although this is likely iatrogenic in the setting of dehydration and then Lasix use) in the setting of influenza A.  - 1 L normal saline bolus ordered.  Continue maintenance fluids thereafter - S/p azithromycin and ceftriaxone.  Will hold off on continuing given viral etiology - Blood cultures obtained - Continue to trend lactic acid  Influenza A Influenza A PCR positive.  - Start renally dosed Tamiflu 30 mg daily  COPD with acute exacerbation (HCC) Secondary to influenza A.  - S/p Solu-Medrol 125 mg once - Continue IV Solu-Medrol given severity of exacerbation.  Can transition to prednisone once improving - DuoNebs every 6 hours - Albuterol nebulizers as needed in between scheduled DuoNeb - Pulmicort twice daily  NSTEMI (non-ST elevated myocardial infarction) (HCC) Strongly suspect  demand in the absence of chest pain and presence of profound hypoxia.  EKG with nonspecific changes only.  Heparin infusion has already been initiated and cardiology was consulted in the ED.  - Cardiology consulted; appreciate their recommendations - Echocardiogram ordered - Continue heparin infusion for now - Trend troponin to peak  Hypotension In the setting of hypovolemia, and suspect Lasix exacerbated.  - IV fluid resuscitation - Hold home antihypertensives  AKI (acute kidney injury) (HCC) In the setting of poor p.o. intake, nausea/vomiting and dehydration.  - IV fluid as ordered - Recheck BMP in the a.m. - Strict in and out - Avoid nephrotoxic agents - Monitor for urinary retention  Hypothyroidism - TSH pending - Continue home Synthroid  Chronic pain syndrome PDMP reviewed.  Patient is currently receiving 10 fentanyl patches 75 mcg/h and oxycodone 30 tablets per month.  - Continue home fentanyl patch - Hold oral oxycodone for severe pain  Atrial fibrillation (HCC) Rates controlled at this time.  - Hold home Eliquis in the setting of heparin infusion  Type 2 diabetes mellitus with stage 3 chronic kidney disease (HCC) - SSI, moderate - Semglee 5 units at bedtime - Hold home regimen  Essential hypertension - Hold home antihypertensives in the setting of hypotension and AKI  Advance Care Planning:   Code Status: Full Code verified by patient with her daughter at bedside.  Consults: Cardiology  Family Communication: Patient's daughter updated at bedside  Severity of Illness: The appropriate patient status for this patient is INPATIENT. Inpatient status is judged to be reasonable  and necessary in order to provide the required intensity of service to ensure the patient's safety. The patient's presenting symptoms, physical exam findings, and initial radiographic and laboratory data in the context of their chronic comorbidities is felt to place them at high risk for  further clinical deterioration. Furthermore, it is not anticipated that the patient will be medically stable for discharge from the hospital within 2 midnights of admission.   * I certify that at the point of admission it is my clinical judgment that the patient will require inpatient hospital care spanning beyond 2 midnights from the point of admission due to high intensity of service, high risk for further deterioration and high frequency of surveillance required.*  Author: Verdene Lennert, MD 07/29/2023 5:50 PM  For on call review [email or web address].

## 2023-07-29 NOTE — Assessment & Plan Note (Signed)
Continue Synthroid

## 2023-07-29 NOTE — Assessment & Plan Note (Signed)
Marland Kitchen

## 2023-07-29 NOTE — Assessment & Plan Note (Addendum)
Switch heparin back to Eliquis today.  Required Cardizem drip yesterday will try to convert off Cardizem drip with oral Cardizem starting today.

## 2023-07-29 NOTE — Consult Note (Signed)
[facility] CLINIC CARDIOLOGY CONSULT NOTE       Patient ID: EMMERIE BATTAGLIA MRN: [id number] DOB/AGE: 1946-02-26 78 y.o.  Admit date: 07/29/2023 Referring Physician Dr. Corena Herter  Primary Physician Miki Kins, FNP  Primary Cardiologist None Reason for Consultation elevated troponin  HPI: Stephanie Small is a 78 y.o. female  with a past medical history of COPD, hypertension, hyperlipidemia, DM2 who presented to the ED on 07/29/2023 for shortness of breath. Cardiology was consulted for further evaluation.   Patient reports that for the last 2 weeks she has had worsening shortness of breath.  Also endorses dizziness and lightheadedness that has also been worsening.  States that her symptoms have been gradually worsening and today she was having such a hard time breathing that her daughter called EMS.  She was brought to the ED for further evaluation.  Workup in the ED notable for creatinine 2.7 up from baseline ~1.0, potassium 3.9, sodium 129, hemoglobin 12.0, WBC 6.7.  Lactic acid mildly elevated at 2.0.  Initial troponin 357, EKG demonstrates normal sinus rhythm without acute ST-T changes.  BNP elevated at 425.  Chest x-ray with chronic appearing lung changes.  At the time of my evaluation patient is resting in ED stretcher with daughter present at bedside.  We discussed her symptoms in further detail.  She reports that she went to a funeral 1 to 2 weeks ago and was around some family members who were sick at that time.  States that after this she started experiencing worsening shortness of breath and cough.  States that now all of her family members that she lives with are also sick.  She endorses fever and chills, states that her shortness of breath symptoms are typically worse with exertion.  She denies any recent issues with swelling.  She reports that a few days ago she had an episode of chest discomfort that occurred overnight and woke her up.  She reports that after sitting up and  walking around her symptoms improved.  She denies any other recent episodes.  States that when she has been walking around her house she has not had any chest pain.  Review of systems complete and found to be negative unless listed above    Past Medical History:  Diagnosis Date   Arthritis    Atypical squamous cells of undetermined significance (ASCUS) on Papanicolaou smear of cervix 10/08/2016   Chest pain 09/20/2020   COPD (chronic obstructive pulmonary disease) (HCC)    Diabetes mellitus without complication (HCC)    Diarrhea 01/09/2021   Elevated troponin    Glaucoma 2010   History of adenomatous polyp of colon    Hypertension 2005   Hypomagnesemia 01/09/2021   Knee pain 05/06/2022   Low back pain of over 3 months duration 05/06/2022   Personal history of colonic polyps    Respiratory distress 10/25/2015   Sepsis (HCC) 01/09/2021   Thyroid cyst    UTI (urinary tract infection) 01/09/2021   Wheezing 10/25/2015    Past Surgical History:  Procedure Laterality Date   CAROTID STENT INSERTION  2011   CHOLECYSTECTOMY  2009   COLONOSCOPY  2007   COLONOSCOPY WITH PROPOFOL N/A 08/18/2016   Procedure: COLONOSCOPY WITH PROPOFOL;  Surgeon: Midge Minium, MD;  Location: [facility];  Service: Endoscopy;  Laterality: N/A;   FOOT SURGERY     SHOULDER SURGERY Bilateral 2004   UPPER GI ENDOSCOPY      (Not in a hospital admission)  Social History  Socioeconomic History   Marital status: Widowed    Spouse name: Not on file   Number of children: Not on file   Years of education: Not on file   Highest education level: Not on file  Occupational History   Not on file  Tobacco Use   Smoking status: Every Day    Types: E-cigarettes   Smokeless tobacco: Never  Vaping Use   Vaping status: Never Used  Substance and Sexual Activity   Alcohol use: No   Drug use: No   Sexual activity: Not Currently    Birth control/protection: Post-menopausal  Other Topics Concern   Not on  file  Social History Narrative   Not on file   Social Drivers of Health   Financial Resource Strain: Not on file  Food Insecurity: Not on file  Transportation Needs: Not on file  Physical Activity: Not on file  Stress: Not on file  Social Connections: Not on file  Intimate Partner Violence: Not on file    Family History  Problem Relation Age of Onset   Hypertension Mother      Vitals:   07/29/23 1343 07/29/23 1348 07/29/23 1455  BP:  127/67 (!) 170/115  Pulse: 80 80 88  Resp: 18 12 17   SpO2: 90% (!) 89% 90%    PHYSICAL EXAM General: Ill appearing elderly female, well nourished, in no acute distress. HEENT: Normocephalic and atraumatic. Neck: No JVD.  Lungs: Increased respiratory effort on 12L [place]. Clear bilaterally to auscultation. No wheezes, crackles, rhonchi.  Heart: HRRR. Normal S1 and S2 without gallops or murmurs.  Abdomen: Non-distended appearing.  Msk: Normal strength and tone for age. Extremities: Warm and well perfused. No clubbing, cyanosis. No edema.  Neuro: Alert and oriented X 3. Psych: Answers questions appropriately.   Labs: Basic Metabolic Panel: Recent Labs    07/29/23 1341  NA 129*  K 3.9  CL 96*  CO2 18*  GLUCOSE 241*  BUN 37*  CREATININE 2.70*  CALCIUM 8.0*   Liver Function Tests: Recent Labs    07/29/23 1341  AST 50*  ALT 35  ALKPHOS 106  BILITOT 0.6  PROT 6.1*  ALBUMIN 3.4*   Recent Labs    07/29/23 1341  LIPASE 21   CBC: Recent Labs    07/29/23 1341  WBC 6.7  NEUTROABS 5.1  HGB 12.0  HCT 36.5  MCV 95.5  PLT 186   Cardiac Enzymes: Recent Labs    07/29/23 1341  TROPONINIHS 357*   BNP: No results for input(s): "BNP" in the last 72 hours. D-Dimer: No results for input(s): "DDIMER" in the last 72 hours. Hemoglobin A1C: No results for input(s): "HGBA1C" in the last 72 hours. Fasting Lipid Panel: No results for input(s): "CHOL", "HDL", "LDLCALC", "TRIG", "CHOLHDL", "LDLDIRECT" in the last 72 hours. Thyroid  Function Tests: No results for input(s): "TSH", "T4TOTAL", "T3FREE", "THYROIDAB" in the last 72 hours.  Invalid input(s): "FREET3" Anemia Panel: No results for input(s): "VITAMINB12", "FOLATE", "FERRITIN", "TIBC", "IRON", "RETICCTPCT" in the last 72 hours.   Radiology: [facility] Chest Port 1 View Result Date: 07/29/2023 CLINICAL DATA:  Sepsis EXAM: PORTABLE CHEST 1 VIEW COMPARISON:  X-ray and CTA 07/26/2021.  Older x-rays as well FINDINGS: Enlarged cardiopericardial silhouette. Calcified aorta. Chronic interstitial changes with some hyperinflation. Slight prominence of central vasculature. No pneumothorax, effusion or consolidation. Overlapping cardiac leads. Curvature and degenerative changes of the spine. IMPRESSION: Enlarged cardiopericardial silhouette. Likely chronic appearing lung changes. Electronically Signed   By: Piedad Climes.D.  On: 07/29/2023 15:09    ECHO ordered  TELEMETRY reviewed by me 07/29/2023: sinus rhythm rate 90s  EKG reviewed by me: sinus rhythm PACs rate 87 bpm, nonischemic  Data reviewed by me 07/29/2023: last 24h vitals tele labs imaging I/O ED provider note, admission H&P  Active Problems:   * No active hospital problems. *    ASSESSMENT AND PLAN:  Stephanie Small is a 78 y.o. female  with a past medical history of COPD, hypertension, hyperlipidemia, DM2 who presented to the ED on 07/29/2023 for shortness of breath. Cardiology was consulted for further evaluation.   # Acute hypoxic respiratory failure # COPD # Elevated troponin Patient presenting with SOB, cough worsening for 2 weeks.  Lactic acid mildly elevated at 2.0.  BNP 425.  Initial troponin 357.  EKG normal sinus rhythm without acute ischemic changes.  Chest x-ray with chronic appearing lung changes. -Continue to trend troponin, if flat then elevation is likely secondary to demand from acute respiratory illness. -Echo ordered -Respiratory panel ordered by ED provider as well as antibiotics. -Agree with  IV Lasix 40 mg x 1.  Will evaluate response. -Heparin ordered by ED provider. -No plan for further cardiac diagnostics at this time.    This patient's plan of care was discussed and created with Dr. Darrold Junker and he is in agreement.  Signed: Gale Journey, PA-C  07/29/2023, 3:34 PM [facility] Cardiology

## 2023-07-29 NOTE — Assessment & Plan Note (Addendum)
Continue oral Cardizem.

## 2023-07-29 NOTE — ED Provider Notes (Signed)
[facility] Provider Note    Event Date/Time   First MD Initiated Contact with Patient 07/29/23 1333     (approximate)   History   Shortness of Breath   HPI  Stephanie Small is a 78 y.o. female patient has a past medical history significant for COPD, diabetes, hypertension, presents to the emergency department with shortness of breath.  Patient states that she started having significantly worsening shortness of breath last night.  Does endorse a recent cough and congestion.  States that everyone who lives with her at her house has been sick.  Denies any chest pain.  Denies any known fever or chills.  When EMS arrived stated that she had a significantly low oxygen level in the 70s.  Had poor air movement.  Given IV Solu-Medrol and 2 mg of IV magnesium prior to arrival.  Patient denies any history of DVT or PE.  On chart review does appear to have Eliquis listed.     Physical Exam   Triage Vital Signs: ED Triage Vitals  Encounter Vitals Group     BP      Systolic BP Percentile      Diastolic BP Percentile      Pulse      Resp      Temp      Temp src      SpO2      Weight      Height      Head Circumference      Peak Flow      Pain Score      Pain Loc      Pain Education      Exclude from Growth Chart     Most recent vital signs: Vitals:   07/29/23 1455 07/29/23 1621  BP: (!) 170/115   Pulse: 88 61  Resp: 17 (!) 22  SpO2: 90% 93%    Physical Exam Constitutional:      General: She is in acute distress.     Appearance: She is well-developed. She is ill-appearing.  HENT:     Head: Atraumatic.  Eyes:     Conjunctiva/sclera: Conjunctivae normal.  Cardiovascular:     Rate and Rhythm: Regular rhythm.  Pulmonary:     Effort: Tachypnea and respiratory distress present.     Breath sounds: Wheezing present.     Comments: Poor air movement, speaking in one-word sentences Abdominal:     General: There is no distension.     Palpations:  Abdomen is soft.  Musculoskeletal:        General: Normal range of motion.     Cervical back: Normal range of motion.     Right lower leg: No edema.     Left lower leg: No edema.  Skin:    General: Skin is warm.  Neurological:     General: No focal deficit present.     Mental Status: She is alert. Mental status is at baseline.     IMPRESSION / MDM / ASSESSMENT AND PLAN / ED COURSE  I reviewed the triage vital signs and the nursing notes.  On arrival patient with findings concerning for respiratory distress.  Patient arrived with nasal cannula from EMS and was 70%.  Placed on high flow nasal cannula and improved to 90%.  Differential diagnose this including ACS, heart failure, COPD exacerbation, pneumonia, viral illness including COVID/influenza, pulmonary embolism  EKG  I, Corena Herter, the attending physician, personally viewed and interpreted this ECG.  Rate: Normal  Rhythm: Normal sinus  Axis: Normal  Intervals: Normal  ST&T Change: Nonspecific ST changes   RADIOLOGY I independently reviewed imaging, my interpretation of imaging: Chest x-ray with cardiomegaly but no obvious focal findings of pneumonia or pulmonary edema  LABS (all labs ordered are listed, but only abnormal results are displayed) Labs interpreted as -    Labs Reviewed  LACTIC ACID, PLASMA - Abnormal; Notable for the following components:      Result Value   Lactic Acid, Venous 2.0 (*)    All other components within normal limits  COMPREHENSIVE METABOLIC PANEL - Abnormal; Notable for the following components:   Sodium 129 (*)    Chloride 96 (*)    CO2 18 (*)    Glucose, Bld 241 (*)    BUN 37 (*)    Creatinine, Ser 2.70 (*)    Calcium 8.0 (*)    Total Protein 6.1 (*)    Albumin 3.4 (*)    AST 50 (*)    GFR, Estimated 18 (*)    All other components within normal limits  CBC WITH DIFFERENTIAL/PLATELET - Abnormal; Notable for the following components:   RBC 3.82 (*)    All other components  within normal limits  PROTIME-INR - Abnormal; Notable for the following components:   Prothrombin Time 20.8 (*)    INR 1.8 (*)    All other components within normal limits  APTT - Abnormal; Notable for the following components:   aPTT 47 (*)    All other components within normal limits  BLOOD GAS, VENOUS - Abnormal; Notable for the following components:   Acid-base deficit 5.7 (*)    All other components within normal limits  BRAIN NATRIURETIC PEPTIDE - Abnormal; Notable for the following components:   B Natriuretic Peptide 425.1 (*)    All other components within normal limits  TROPONIN I (HIGH SENSITIVITY) - Abnormal; Notable for the following components:   Troponin I (High Sensitivity) 357 (*)    All other components within normal limits  CULTURE, BLOOD (ROUTINE X 2)  CULTURE, BLOOD (ROUTINE X 2)  RESP PANEL BY RT-PCR (RSV, FLU A&B, COVID)  RVPGX2  LIPASE, BLOOD  LACTIC ACID, PLASMA  HEPARIN LEVEL (UNFRACTIONATED)  PROCALCITONIN  TROPONIN I (HIGH SENSITIVITY)     MDM    Patient's blood gas she does have a pH of 7.26 but no significant hypercarbia.  Patient with mild leukocytosis.  Blood culture obtained.  Lactic acid mildly elevated at 2.  Given recent viral symptoms of cough and congestion with new oxygen requirement will treat the patient for pneumonia.  Started on cefepime given her history of COPD.  Started on azithromycin.  Felt that 30 cc of IV fluids would be detrimental to the patient, findings concerning for heart failure so will not give fluids at this time.  Has a significantly elevated BNP of the 400s and troponin elevated in the 300s.  Will start on IV Lasix.  Discussed with cardiology Dr. Darrold Junker who will evaluate the patient but did not want any further intervention at this time.   Ordered CTA to evaluate for pulmonary embolism however patient with a significant acute kidney injury and unable to obtain CTA given her low GFR.  Have low suspicion for dissection,  no tearing chest pain and no chest pain at this time.  On reevaluation continued to be hypoxic, discussed heated high flow with respiratory therapy.  Patient admitted to the hospitalist for acute hypoxic respiratory failure  PROCEDURES:  Critical Care performed: yes  .Critical Care  Performed by: Corena Herter, MD Authorized by: Corena Herter, MD   Critical care provider statement:    Critical care time (minutes):  35   Critical care time was exclusive of:  Separately billable procedures and treating other patients   Critical care was necessary to treat or prevent imminent or life-threatening deterioration of the following conditions:  Respiratory failure   Critical care was time spent personally by me on the following activities:  Development of treatment plan with patient or surrogate, discussions with consultants, evaluation of patient's response to treatment, examination of patient, ordering and review of laboratory studies, ordering and review of radiographic studies, ordering and performing treatments and interventions, pulse oximetry, re-evaluation of patient's condition and review of old charts   Care discussed with: admitting provider     Patient's presentation is most consistent with acute presentation with potential threat to life or bodily function.   MEDICATIONS ORDERED IN ED: Medications  furosemide (LASIX) injection 40 mg (has no administration in time range)  cefTRIAXone (ROCEPHIN) 2 g in sodium chloride 0.9 % 100 mL IVPB (has no administration in time range)  azithromycin (ZITHROMAX) 500 mg in sodium chloride 0.9 % 250 mL IVPB (has no administration in time range)  insulin aspart (novoLOG) injection 0-15 Units (has no administration in time range)  methylPREDNISolone sodium succinate (SOLU-MEDROL) 40 mg/mL injection 40 mg (has no administration in time range)  ipratropium-albuterol (DUONEB) 0.5-2.5 (3) MG/3ML nebulizer solution 3 mL (has no administration in time  range)  albuterol (PROVENTIL) (2.5 MG/3ML) 0.083% nebulizer solution 2.5 mg (has no administration in time range)  budesonide (PULMICORT) nebulizer solution 0.5 mg (has no administration in time range)  ondansetron (ZOFRAN) injection 4 mg (4 mg Intravenous Given 07/29/23 1345)  ipratropium-albuterol (DUONEB) 0.5-2.5 (3) MG/3ML nebulizer solution 9 mL (9 mLs Nebulization Given 07/29/23 1345)    FINAL CLINICAL IMPRESSION(S) / ED DIAGNOSES   Final diagnoses:  NSTEMI (non-ST elevated myocardial infarction) (HCC)  COPD exacerbation (HCC)  Hypoxia     Rx / DC Orders   ED Discharge Orders     None        Note:  This document was prepared using Dragon voice recognition software and may include unintentional dictation errors.   Corena Herter, MD 07/29/23 1625

## 2023-07-30 DIAGNOSIS — J441 Chronic obstructive pulmonary disease with (acute) exacerbation: Secondary | ICD-10-CM | POA: Diagnosis not present

## 2023-07-30 DIAGNOSIS — Z794 Long term (current) use of insulin: Secondary | ICD-10-CM

## 2023-07-30 DIAGNOSIS — I482 Chronic atrial fibrillation, unspecified: Secondary | ICD-10-CM

## 2023-07-30 DIAGNOSIS — J9601 Acute respiratory failure with hypoxia: Secondary | ICD-10-CM | POA: Diagnosis not present

## 2023-07-30 DIAGNOSIS — N1831 Chronic kidney disease, stage 3a: Secondary | ICD-10-CM

## 2023-07-30 DIAGNOSIS — E871 Hypo-osmolality and hyponatremia: Secondary | ICD-10-CM | POA: Diagnosis present

## 2023-07-30 DIAGNOSIS — I5A Non-ischemic myocardial injury (non-traumatic): Secondary | ICD-10-CM | POA: Diagnosis present

## 2023-07-30 DIAGNOSIS — I9589 Other hypotension: Secondary | ICD-10-CM

## 2023-07-30 DIAGNOSIS — J101 Influenza due to other identified influenza virus with other respiratory manifestations: Secondary | ICD-10-CM | POA: Diagnosis not present

## 2023-07-30 LAB — CBC
HCT: 33.1 % — ABNORMAL LOW (ref 36.0–46.0)
Hemoglobin: 10.8 g/dL — ABNORMAL LOW (ref 12.0–15.0)
MCH: 31.6 pg (ref 26.0–34.0)
MCHC: 32.6 g/dL (ref 30.0–36.0)
MCV: 96.8 fL (ref 80.0–100.0)
Platelets: 162 10*3/uL (ref 150–400)
RBC: 3.42 MIL/uL — ABNORMAL LOW (ref 3.87–5.11)
RDW: 13.6 % (ref 11.5–15.5)
WBC: 6.1 10*3/uL (ref 4.0–10.5)
nRBC: 0 % (ref 0.0–0.2)

## 2023-07-30 LAB — APTT
aPTT: 62 s — ABNORMAL HIGH (ref 24–36)
aPTT: 76 s — ABNORMAL HIGH (ref 24–36)
aPTT: 73 s — ABNORMAL HIGH (ref 24–36)

## 2023-07-30 LAB — COMPREHENSIVE METABOLIC PANEL
ALT: 31 U/L (ref 0–44)
AST: 43 U/L — ABNORMAL HIGH (ref 15–41)
Albumin: 3.1 g/dL — ABNORMAL LOW (ref 3.5–5.0)
Alkaline Phosphatase: 94 U/L (ref 38–126)
Anion gap: 12 (ref 5–15)
BUN: 43 mg/dL — ABNORMAL HIGH (ref 8–23)
CO2: 19 mmol/L — ABNORMAL LOW (ref 22–32)
Calcium: 7.6 mg/dL — ABNORMAL LOW (ref 8.9–10.3)
Chloride: 98 mmol/L (ref 98–111)
Creatinine, Ser: 2.97 mg/dL — ABNORMAL HIGH (ref 0.44–1.00)
GFR, Estimated: 16 mL/min — ABNORMAL LOW (ref >60)
Glucose, Bld: 231 mg/dL — ABNORMAL HIGH (ref 70–99)
Potassium: 3.9 mmol/L (ref 3.5–5.1)
Sodium: 129 mmol/L — ABNORMAL LOW (ref 135–145)
Total Bilirubin: 0.5 mg/dL (ref 0.0–1.2)
Total Protein: 5.8 g/dL — ABNORMAL LOW (ref 6.5–8.1)

## 2023-07-30 LAB — CBG MONITORING, ED
Glucose-Capillary: 187 mg/dL — ABNORMAL HIGH (ref 70–99)
Glucose-Capillary: 231 mg/dL — ABNORMAL HIGH (ref 70–99)
Glucose-Capillary: 240 mg/dL — ABNORMAL HIGH (ref 70–99)

## 2023-07-30 LAB — HEPARIN LEVEL (UNFRACTIONATED): Heparin Unfractionated: >1.1 [IU]/mL — ABNORMAL HIGH (ref 0.30–0.70)

## 2023-07-30 MED ORDER — HEPARIN BOLUS VIA INFUSION
1150.0000 [IU] | Freq: Once | INTRAVENOUS | Status: AC
  Administered 2023-07-30: 1150 [IU] via INTRAVENOUS
  Filled 2023-07-30: qty 1150

## 2023-07-30 MED ORDER — ACETAMINOPHEN 325 MG PO TABS
650.0000 mg | ORAL_TABLET | Freq: Four times a day (QID) | ORAL | Status: DC | PRN
Start: 2023-07-30 — End: 2023-08-06

## 2023-07-30 MED ORDER — OXYCODONE HCL 5 MG PO TABS
5.0000 mg | ORAL_TABLET | Freq: Four times a day (QID) | ORAL | Status: DC | PRN
  Administered 2023-07-31: 5 mg via ORAL
  Filled 2023-07-30: qty 1

## 2023-07-30 MED ORDER — LEVOTHYROXINE SODIUM 50 MCG PO TABS
50.0000 ug | ORAL_TABLET | Freq: Every day | ORAL | Status: DC
  Administered 2023-07-31 – 2023-08-06 (×7): 50 ug via ORAL
  Filled 2023-07-30 (×7): qty 1

## 2023-07-30 MED ORDER — ACETAMINOPHEN 650 MG RE SUPP: 650.0000 mg | Freq: Four times a day (QID) | RECTAL | Status: DC | PRN

## 2023-07-30 NOTE — Assessment & Plan Note (Addendum)
Secondary to demand ischemia with acute respiratory failure.  Cardiology does not recommend any intervention.  Echocardiogram showed an EF of 60 to 65%.

## 2023-07-30 NOTE — ED Notes (Signed)
Pt called out c/o nausea. See mar.

## 2023-07-30 NOTE — ED Notes (Signed)
Bed assigned is semi-private, pt is flu +, bed assignment will change. Pt and family updated.

## 2023-07-30 NOTE — Consult Note (Signed)
Pharmacy Consult Note - Anticoagulation  Pharmacy Consult for heparin Indication: chest pain/ACS  PATIENT MEASUREMENTS: Height: 5\' 4"  (162.6 cm) Weight: 97.5 kg (215 lb) IBW/kg (Calculated) : 54.7 HEPARIN DW (KG): 77.1  VITAL SIGNS: Temp: 98 F (36.7 C) (01/30 1210) Temp Source: Oral (01/30 1210) BP: 107/72 (01/30 1430) Pulse Rate: 85 (01/30 1430)  Recent Labs    07/29/23 1341 07/29/23 1631 07/29/23 1959 07/30/23 0201 07/30/23 1445  HGB 12.0  --   --  10.8*  --   HCT 36.5  --   --  33.1*  --   PLT 186  --   --  162  --   APTT 47*  --   --  62* 76*  LABPROT 20.8*  --   --   --   --   INR 1.8*  --   --   --   --   HEPARINUNFRC  --    < >  --  >1.10*  --   CREATININE 2.70*  --  3.02* 2.97*  --   TROPONINIHS 357*   < > 359*  --   --    < > = values in this interval not displayed.    Estimated Creatinine Clearance: 18.6 mL/min (A) (by C-G formula based on SCr of 2.97 mg/dL (H)).  PAST MEDICAL HISTORY: Past Medical History:  Diagnosis Date   Arthritis    Atypical squamous cells of undetermined significance (ASCUS) on Papanicolaou smear of cervix 10/08/2016   Chest pain 09/20/2020   COPD (chronic obstructive pulmonary disease) (HCC)    Diabetes mellitus without complication (HCC)    Diarrhea 01/09/2021   Elevated troponin    Glaucoma 2010   History of adenomatous polyp of colon    Hypertension 2005   Hypomagnesemia 01/09/2021   Knee pain 05/06/2022   Low back pain of over 3 months duration 05/06/2022   Personal history of colonic polyps    Respiratory distress 10/25/2015   Sepsis (HCC) 01/09/2021   Thyroid cyst    UTI (urinary tract infection) 01/09/2021   Wheezing 10/25/2015    ASSESSMENT: 78 y.o. female with PMH including Afib, COPD, HTN2 is presenting with elevated troponins, concerning for ACS. Patient is on chronic anticoagulation with Eliquis for Afib per chart review, last dose was this morning. CHA2DS2VASc is at least 18 (age +92, sex, HTN). Pharmacy has  been consulted to initiate and manage heparin intravenous infusion. Patient will need ECHO before stopping heparin.  Pertinent medications: Eliquis 5 mg twice daily, last dose was this morning  Goal(s) of therapy: Heparin level 0.3 - 0.7 units/mL aPTT 66 - 102 seconds Monitor platelets by anticoagulation protocol: Yes   Baseline anticoagulation labs: Recent Labs    07/29/23 1341 07/30/23 0201 07/30/23 1445  APTT 47* 62* 76*  INR 1.8*  --   --   HGB 12.0 10.8*  --   PLT 186 162  --     Date Time aPTT/HL Rate/Comment 1/30     0201    62/>1.10        900 units/hr, SUBtherapeutic 1/30 1445 76  Therapeutic x 1   PLAN: Continue heparin at 1050 units/hr Continue to titrate by aPTT until heparin level and aPTT correlate and/or Eliquis washes out, then titrate by heparin level alone. Continue to monitor CBC daily while on heparin infusion.   Orson Aloe Clinical Pharmacist 07/30/2023 3:09 PM

## 2023-07-30 NOTE — ED Notes (Signed)
Pt noted to be 88% on 8L while sleeping. Oxygen titrated to 10L at this time.

## 2023-07-30 NOTE — ED Notes (Signed)
Pt assisted back into bed without difficulty. Pt unable to have BM at this time

## 2023-07-30 NOTE — Progress Notes (Signed)
   07/30/23 1647  Readmission Prevention Plan - High Risk  Transportation Screening Complete  PCP or Specialist appointment within 5-7 days of discharge Complete (RN Secretary will scheudle at time of d/c)  Home Care Consult (High Risk) Complete  High Risk Social Work Consult for recovery care planning/counseling (includes patient and caregiver) Complete  High Risk Palliative Care Screening Not Applicable  Medication Review Complete   HRRA complete.

## 2023-07-30 NOTE — Evaluation (Signed)
Occupational Therapy Evaluation Patient Details Name: Stephanie Small MRN: [id number] DOB: [birth year] Today's Date: 07/30/2023   History of Present Illness 78 y.o. female with medical history significant of COPD, chronic pain syndrome, hypothyroidism, type 2 diabetes, hypertension, hyperlipidemia, who presents to the ED due to shortness of breath.  Pt found to be + for flu.   Clinical Impression   Pt was seen for OT evaluation this date. Prior to hospital admission, pt was generally independent, no home O2. Pt presents to acute OT demonstrating impaired ADL performance and functional mobility 2/2 acute O2 needs, decreased strength, and activity tolerance (See OT problem list for additional functional deficits). Pt currently requires SBA for short distance ADL mobility and transfers, pericare in standing. SpO2 >90% with limited mobility/ADL. Pt would benefit from skilled OT services to address noted impairments and functional limitations (see below for any additional details) in order to maximize safety and independence while minimizing falls risk and caregiver burden.     If plan is discharge home, recommend the following: A little help with walking and/or transfers;A little help with bathing/dressing/bathroom    Functional Status Assessment  Patient has had a recent decline in their functional status and demonstrates the ability to make significant improvements in function in a reasonable and predictable amount of time.  Equipment Recommendations  None recommended by OT    Recommendations for Other Services       Precautions / Restrictions Precautions Precautions: Fall Restrictions Weight Bearing Restrictions Per Provider Order: No      Mobility Bed Mobility Overal bed mobility: Modified Independent                  Transfers Overall transfer level: Needs assistance Equipment used: None Transfers: Sit to/from Stand, Bed to chair/wheelchair/BSC Sit to Stand:  Supervision     Step pivot transfers: Supervision            Balance Overall balance assessment: Modified Independent                                         ADL either performed or assessed with clinical judgement   ADL Overall ADL's : Needs assistance/impaired                         Toilet Transfer: Contact guard assist;BSC/3in1   Toileting- Clothing Manipulation and Hygiene: Set up;Supervision/safety;Sit to/from stand               Vision         Perception         Praxis         Pertinent Vitals/Pain Pain Assessment Pain Assessment: No/denies pain     Extremity/Trunk Assessment Upper Extremity Assessment Upper Extremity Assessment: Generalized weakness;Overall [facility] for tasks assessed   Lower Extremity Assessment Lower Extremity Assessment: Generalized weakness;Overall WFL for tasks assessed       Communication Communication Communication: No apparent difficulties   Cognition Arousal: Alert Behavior During Therapy: WFL for tasks assessed/performed Overall Cognitive Status: Within Functional Limits for tasks assessed                                       General Comments  no O2 at baseline, on 8L t/o session, SpO2 >90%    Exercises  Shoulder Instructions      Home Living Family/patient expects to be discharged to:: Private residence Living Arrangements: Children Available Help at Discharge: Available 24 hours/day Type of Home: House Home Access: Stairs to enter [employer] of Steps: 1 larger than 8" step and smaller step up from the [employer]:  (no, typically grabs the door jam for the taller step) Home Layout: One level               Home Equipment: Cane - single point          Prior Functioning/Environment Prior Level of Function : Independent/Modified Independent             Mobility Comments: Pt was able to easily manage in the home with  [facility], until recent spell of family illness she was apparently getting out the the home ~1x/wk ADLs Comments: independnet with ADLs, family only really helps with OOH tasks        OT Problem List: Cardiopulmonary status limiting activity;Decreased strength;Decreased activity tolerance;Decreased knowledge of use of DME or AE;Impaired balance (sitting and/or standing);Obesity      OT Treatment/Interventions: Self-care/ADL training;Therapeutic exercise;Therapeutic activities;Energy conservation;DME and/or AE instruction;Patient/family education;Balance training    OT Goals(Current goals can be found in the care plan section) Acute Rehab OT Goals Patient Stated Goal: get better OT Goal Formulation: With patient Time For Goal Achievement: 08/13/23 Potential to Achieve Goals: Good ADL Goals Pt Will Perform Lower Body Dressing: with modified independence;sitting/lateral leans (SpO2 >90% on 2L or less) Pt Will Transfer to Toilet: with modified independence;ambulating (LRAD) Pt Will Perform Toileting - Clothing Manipulation and hygiene: with modified independence;sit to/from stand Additional ADL Goal #1: Pt will demo use of learned ECS to support ADL/mobility efforts with no VC to utilize, 3/3 opportutnies.  OT Frequency: Min 1X/week    Co-evaluation              AM-PAC OT "6 Clicks" Daily Activity     Outcome Measure Help from another person eating meals?: None Help from another person taking care of personal grooming?: None Help from another person toileting, which includes using toliet, bedpan, or urinal?: A Little Help from another person bathing (including washing, rinsing, drying)?: A Little Help from another person to put on and taking off regular upper body clothing?: None Help from another person to put on and taking off regular lower body clothing?: A Little 6 Click Score: 21   End of Session Equipment Utilized During Treatment: Oxygen  Activity Tolerance: Patient tolerated  treatment well Patient left: in bed;with call bell/phone within reach;with bed alarm set  OT Visit Diagnosis: Other abnormalities of gait and mobility (R26.89);Muscle weakness (generalized) (M62.81)                Time: 5284-1324 OT Time Calculation (min): 15 min Charges:  OT General Charges $OT Visit: 1 Visit OT Evaluation $OT Eval Low Complexity: 1 Low  Arman Filter., MPH, MS, OTR/L ascom [phone] 07/30/23, 4:26 PM

## 2023-07-30 NOTE — Progress Notes (Signed)
Progress Note   Patient: Stephanie Small WUJ:[id number] DOB: 24-Apr-1946 DOA: 07/29/2023     1 DOS: the patient was seen and examined on 07/30/2023   Brief hospital course: 78 year old female past medical history of COPD, chronic pain, hypothyroidism, type 2 diabetes mellitus, hypertension, hyperlipidemia presented to the ED with shortness of breath.  She has been feeling unwell for the last week.  Her household is also all sick.  Developed shortness of breath within 24 hours of coming in and quickly worsened.  She is found to have a pulse ox of 89% on 12 L of high flow nasal cannula.  Found to be positive for influenza A.  Also had an elevated troponin but denies chest pain.  1/30.  Patient was on high flow nasal cannula 12 L and decrease down to 10 L.  Feels better than when she came in but still not feeling great.  Assessment and Plan: * Acute respiratory failure with hypoxia and hypercapnia (HCC) Severe hypoxia with oxygen saturation as low as 70% and mild hypercapnia with pCO2 48 in the setting of COPD exacerbation, influenza A, and dehydration.    Patient currently on high flow nasal cannula was on 12 L at night on her down to 10.  Will continue empiric antibiotics just in case secondarily infected.  COPD exacerbation (HCC) Secondary to influenza A. S/p Solu-Medrol 125 mg once Continue 40 mg of Solu-Medrol daily.   Influenza A With pneumonia, Influenza A PCR positive. Continue renally dosed Tamiflu 30 mg daily Sepsis ruled out did not meet all of the SIRS criteria.  Myocardial injury Secondary to demand ischemia with acute respiratory failure.  Patient on heparin drip.  Cardiology following.  Echocardiogram ordered.  No complaints of chest pain.  Hypotension IV fluid hydration.  AKI (acute kidney injury) (HCC) On chronic kidney disease stage IIIa.  In the setting of poor p.o. intake, nausea/vomiting and dehydration.  Continue 28 hours of IV fluid  hydration.  Hyponatremia Likely secondary to dehydration  Hypothyroidism Continue Synthroid at lower dose with TSH on the lower side.  Chronic pain syndrome Continue home fentanyl patch, as needed oxycodone.  Atrial fibrillation (HCC) Hold home Eliquis with heparin infusion  Type 2 diabetes mellitus with stage 3 chronic kidney disease (HCC) Hemoglobin A1c 7.3.  On low-dose Semglee insulin and sliding scale.  Essential hypertension Holding hypertensive medications.        Subjective: Patient feels better than yesterday.  Coming in with shortness of breath and cough.  She is a smoker.  Everybody else at home sick also.  Admitted with respiratory distress and found to have influenza A infection.  Physical Exam: Vitals:   07/30/23 1030 07/30/23 1210 07/30/23 1342 07/30/23 1430  BP: 117/70 120/72  107/72  Pulse: 69 82 82 85  Resp: 20 20 19  (!) 27  Temp:  98 F (36.7 C)    TempSrc:  Oral    SpO2: 98% 94% 96% 96%  Weight:      Height:       Physical Exam HENT:     Head: Normocephalic.     Mouth/Throat:     Pharynx: No oropharyngeal exudate.  Eyes:     General: Lids are normal.     Conjunctiva/sclera: Conjunctivae normal.  Cardiovascular:     Rate and Rhythm: Normal rate and regular rhythm.     Heart sounds: Normal heart sounds, S1 normal and S2 normal.  Pulmonary:     Breath sounds: Examination of the right-middle field reveals  decreased breath sounds and rhonchi. Examination of the left-middle field reveals decreased breath sounds and rhonchi. Examination of the right-lower field reveals decreased breath sounds and rales. Examination of the left-lower field reveals decreased breath sounds and rales. Decreased breath sounds, rhonchi and rales present.  Abdominal:     Palpations: Abdomen is soft.     Tenderness: There is no abdominal tenderness.  Musculoskeletal:     Right lower leg: No swelling.     Left lower leg: No swelling.  Skin:    General: Skin is warm.      Findings: No rash.  Neurological:     Mental Status: She is alert and oriented to person, place, and time.     Data Reviewed: TSH 0.058, sodium 129, creatinine 2.97, troponin peaked at 478, hemoglobin 10.8  Family Communication: Spoke with daughter at the bedside  Disposition: Status is: Inpatient Remains inpatient appropriate because: Patient on high flow nasal cannula and has bronchospasm  Planned Discharge Destination: Home    Time spent: 28 minutes  Author: Alford Highland, MD 07/30/2023 2:53 PM  For on call review [email or web address].

## 2023-07-30 NOTE — ED Notes (Signed)
Assumed care of pt at this time. Pt denies needs at this time. Son at bedside

## 2023-07-30 NOTE — ED Notes (Signed)
Family at bedside. Pt provided coffee per request.

## 2023-07-30 NOTE — Progress Notes (Signed)
Transition of Care [facility]) - Inpatient Brief Assessment   Patient Details  Name: Stephanie Small MRN: [id number] Date of Birth: 23-Feb-1946  Transition [facility]) CM/SW Contact:    Tory Emerald, LCSW Phone Number: 07/30/2023, 4:42 PM   Clinical Narrative:  CSW completed chart review and noted pt was recommended PT/OT. CSW contacted pt's daughter to discuss services and provide choice. Pt's daughter request [facility]. CSW reached out to [facility] and completed HHS referral via phone.    Transition of Care Asessment: Insurance and Status: (P) Insurance coverage has been reviewed Patient has primary care physician: (P) Yes Home environment has been reviewed: (P) Yes Prior level of function:: (P) Home w/ famly, uses a rolling walker Prior/Current Home Services: (P) No current home services Social Drivers of Health Review: (P) SDOH reviewed interventions complete Readmission risk has been reviewed: (P) Yes Transition of care needs: (P) transition of care needs identified, TOC will continue to follow

## 2023-07-30 NOTE — Progress Notes (Signed)
PT Cancellation Note  Patient Details Name: Stephanie Small MRN: [id number] DOB: [birth year]   Cancelled Treatment:    Reason Eval/Treat Not Completed: Medical issues which prohibited therapy Spoke with nurse who reports pt remains on 12 L O2 and is not ready to work with PT at this time.  Pt will remain on caseload and we will attempt to see when appropriate - nurse to be in communication.   Malachi Pro, DPT 07/30/2023, 9:24 AM

## 2023-07-30 NOTE — Evaluation (Signed)
Physical Therapy Evaluation Patient Details Name: Stephanie Small MRN: [id number] DOB: 09/12/1945 Today's Date: 07/30/2023  History of Present Illness  78 y.o. female with medical history significant of COPD, chronic pain syndrome, hypothyroidism, type 2 diabetes, hypertension, hyperlipidemia, who presents to the ED due to shortness of breath.  Pt found to be + for flu.  Clinical Impression  Pt on 8L t/o session (baseline no O2), O2 remained in the 90s initially with each ambulation effort but did drop to high 80s with clear fatigue.  Pt managed ~40-50 ft with FWW and another ~20 ft with SPC - good relative confidence and balance but poor overall activity tolerance as compared to her baseline.  Pt will benefit from continued PT to address functional and activity tolerance limitations.       If plan is discharge home, recommend the following: A little help with walking and/or transfers;A little help with bathing/dressing/bathroom   Can travel by private vehicle        Equipment Recommendations Rolling walker (2 wheels)  Recommendations for Other Services       Functional Status Assessment Patient has had a recent decline in their functional status and demonstrates the ability to make significant improvements in function in a reasonable and predictable amount of time.     Precautions / Restrictions Precautions Precautions: Fall Restrictions Weight Bearing Restrictions Per Provider Order: No      Mobility  Bed Mobility Overal bed mobility: Modified Independent             General bed mobility comments: Pt able to get LEs toward EOB and attain sitting w/o assist, self selects sitting EOB post session    Transfers Overall transfer level: Modified independent Equipment used: Rolling walker (2 wheels), Straight cane               General transfer comment: Pt was able to rise multiple times with various ADs w/o need for assist.  Minimal cuing for safety/lines/leads  awareness    Ambulation/Gait Ambulation/Gait assistance: Contact guard assist Gait Distance (Feet): 50 Feet Assistive device: Rolling walker (2 wheels), Straight cane         General Gait Details: 2 bouts of ambulation.  50 then 30 ft.  First with walker, second with SPC. Pt on 8L O2 during each effort, seated rest break between each effort.  SpO2 drops to high 80s with some DOE but regarding balance, safety, etc pt did quite well.  CGA only for line/lean management  Stairs            Wheelchair Mobility     Tilt Bed    Modified Rankin (Stroke Patients Only)       Balance Overall balance assessment: Modified Independent                                           Pertinent Vitals/Pain Pain Assessment Pain Assessment: No/denies pain    Home Living Family/patient expects to be discharged to:: Private residence Living Arrangements: Children Available Help at Discharge: Available 24 hours/day   Home Access: Stairs to enter Entrance Stairs-Rails:  (no, typically grabs the door jam for the taller step) Entrance Stairs-Number of Steps: 1 larger than 8" step and smaller step up from the porch   Home Layout: One level Home Equipment: Cane - single point      Prior Function Prior Level of Function :  Independent/Modified Independent             Mobility Comments: Pt was able to easily manage in the home with [facility], until recent spell of family illness she was apparently getting out the the home ~1x/wk ADLs Comments: independnet with ADLs, family only really helps with OOH tasks     Extremity/Trunk Assessment   Upper Extremity Assessment Upper Extremity Assessment: Generalized weakness;Overall [facility] for tasks assessed    Lower Extremity Assessment Lower Extremity Assessment: Generalized weakness;Overall WFL for tasks assessed       Communication   Communication Communication: No apparent difficulties  Cognition Arousal: Alert Behavior  During Therapy: WFL for tasks assessed/performed Overall Cognitive Status: Within Functional Limits for tasks assessed                                          General Comments General comments (skin integrity, edema, etc.): no O2 at baseline, on 8L t/o session    Exercises     Assessment/Plan    PT Assessment Patient needs continued PT services  PT Problem List Decreased strength;Decreased activity tolerance;Decreased knowledge of use of DME;Decreased safety awareness;Cardiopulmonary status limiting activity       PT Treatment Interventions DME instruction;Gait training;Functional mobility training;Therapeutic activities;Balance training;Stair training;Therapeutic exercise;Patient/family education    PT Goals (Current goals can be found in the Care Plan section)  Acute Rehab PT Goals Patient Stated Goal: Get breathing better and go home PT Goal Formulation: With patient Time For Goal Achievement: 08/12/23 Potential to Achieve Goals: Good    Frequency Min 1X/week     Co-evaluation               AM-PAC PT "6 Clicks" Mobility  Outcome Measure Help needed turning from your back to your side while in a flat bed without using bedrails?: None Help needed moving from lying on your back to sitting on the side of a flat bed without using bedrails?: None Help needed moving to and from a bed to a chair (including a wheelchair)?: None Help needed standing up from a chair using your arms (e.g., wheelchair or bedside chair)?: A Little Help needed to walk in hospital room?: A Little Help needed climbing 3-5 steps with a railing? : A Lot 6 Click Score: 20    End of Session Equipment Utilized During Treatment: Oxygen (8L) Activity Tolerance: Patient tolerated treatment well;Patient limited by fatigue Patient left: in bed;with call bell/phone within reach;with family/visitor present Nurse Communication: Mobility status PT Visit Diagnosis: Muscle weakness  (generalized) (M62.81);Difficulty in walking, not elsewhere classified (R26.2)    Time: 9562-1308 PT Time Calculation (min) (ACUTE ONLY): 32 min   Charges:   PT Evaluation $PT Eval Low Complexity: 1 Low PT Treatments $Gait Training: 8-22 mins PT General Charges $$ ACUTE PT VISIT: 1 Visit         Malachi Pro, DPT 07/30/2023, 3:04 PM

## 2023-07-30 NOTE — Consult Note (Signed)
Pharmacy Consult Note - Anticoagulation  Pharmacy Consult for heparin Indication: chest pain/ACS  PATIENT MEASUREMENTS: Height: 5\' 4"  (162.6 cm) Weight: 97.5 kg (215 lb) IBW/kg (Calculated) : 54.7 HEPARIN DW (KG): 77.1  VITAL SIGNS: Temp: 97.7 F (36.5 C) (01/29 2251) Temp Source: Oral (01/29 2251) BP: 89/50 (01/30 0313) Pulse Rate: 71 (01/30 0313)  Recent Labs    07/29/23 1341 07/29/23 1631 07/29/23 1959 07/30/23 0201  HGB 12.0  --   --  10.8*  HCT 36.5  --   --  33.1*  PLT 186  --   --  162  APTT 47*  --   --  62*  LABPROT 20.8*  --   --   --   INR 1.8*  --   --   --   HEPARINUNFRC  --    < >  --  >1.10*  CREATININE 2.70*  --  3.02* 2.97*  TROPONINIHS 357*   < > 359*  --    < > = values in this interval not displayed.    Estimated Creatinine Clearance: 18.6 mL/min (A) (by C-G formula based on SCr of 2.97 mg/dL (H)).  PAST MEDICAL HISTORY: Past Medical History:  Diagnosis Date   Arthritis    Atypical squamous cells of undetermined significance (ASCUS) on Papanicolaou smear of cervix 10/08/2016   Chest pain 09/20/2020   COPD (chronic obstructive pulmonary disease) (HCC)    Diabetes mellitus without complication (HCC)    Diarrhea 01/09/2021   Elevated troponin    Glaucoma 2010   History of adenomatous polyp of colon    Hypertension 2005   Hypomagnesemia 01/09/2021   Knee pain 05/06/2022   Low back pain of over 3 months duration 05/06/2022   Personal history of colonic polyps    Respiratory distress 10/25/2015   Sepsis (HCC) 01/09/2021   Thyroid cyst    UTI (urinary tract infection) 01/09/2021   Wheezing 10/25/2015    ASSESSMENT: 78 y.o. female with PMH including Afib, COPD, HTN2 is presenting with elevated troponins, concerning for ACS. Patient is on chronic anticoagulation with Eliquis for Afib per chart review, last dose was this morning. CHA2DS2VASc is at least 25 (age +14, sex, HTN). Pharmacy has been consulted to initiate and manage heparin intravenous  infusion.  Pertinent medications: Eliquis 5 mg twice daily, last dose was this morning  Goal(s) of therapy: Heparin level 0.3 - 0.7 units/mL aPTT 66 - 102 seconds Monitor platelets by anticoagulation protocol: Yes   Baseline anticoagulation labs: Recent Labs    07/29/23 1341 07/30/23 0201  APTT 47* 62*  INR 1.8*  --   HGB 12.0 10.8*  PLT 186 162    Date Time aPTT/HL Rate/Comment 1/30     0201     62/>1.10        900 units/hr, SUBtherapeutic   PLAN: 1/30 @ 0201:  aPTT = 62,  HL = > 1.10 - aPTT subtherapeutic,  HL elevated from Eliquis PTA - will order heparin 1150 units IV X 1 bolus and increase drip rate to 1050 units/hr - will recheck aPTT 8 hrs after rate change - will recheck HL on 1/31 with AM labs.  Continue to titrate by aPTT until heparin level and aPTT correlate and/or Eliquis washes out, then titrate by heparin level alone. Continue to monitor CBC daily while on heparin infusion.   Rachelanne Whidby D Clinical Pharmacist 07/30/2023 3:39 AM

## 2023-07-30 NOTE — ED Notes (Signed)
Pt assisted to bedside commode

## 2023-07-30 NOTE — Progress Notes (Signed)
[facility] CARDIOLOGY CONSULT NOTE       Patient ID: TOVA VATER MRN: [id number] DOB/AGE: Dec 18, 1945 78 y.o.  Admit date: 07/29/2023 Referring Physician Dr. Corena Herter  Primary Physician Miki Kins, FNP  Primary Cardiologist None Reason for Consultation elevated troponin  HPI: NALIYAH NETH is a 78 y.o. female  with a past medical history of COPD, hypertension, hyperlipidemia, DM2 who presented to the ED on 07/29/2023 for shortness of breath. Cardiology was consulted for further evaluation.   Interval history: -Patient seen and examined this AM, reports feeling better overall today.  -Cr still elevated. Denies any chest pain, SOB improved.  -BP and HR remain stable.   Review of systems complete and found to be negative unless listed above    Past Medical History:  Diagnosis Date   Arthritis    Atypical squamous cells of undetermined significance (ASCUS) on Papanicolaou smear of cervix 10/08/2016   Chest pain 09/20/2020   COPD (chronic obstructive pulmonary disease) (HCC)    Diabetes mellitus without complication (HCC)    Diarrhea 01/09/2021   Elevated troponin    Glaucoma 2010   History of adenomatous polyp of colon    Hypertension 2005   Hypomagnesemia 01/09/2021   Knee pain 05/06/2022   Low back pain of over 3 months duration 05/06/2022   Personal history of colonic polyps    Respiratory distress 10/25/2015   Sepsis (HCC) 01/09/2021   Thyroid cyst    UTI (urinary tract infection) 01/09/2021   Wheezing 10/25/2015    Past Surgical History:  Procedure Laterality Date   CAROTID STENT INSERTION  2011   CHOLECYSTECTOMY  2009   COLONOSCOPY  2007   COLONOSCOPY WITH PROPOFOL N/A 08/18/2016   Procedure: COLONOSCOPY WITH PROPOFOL;  Surgeon: Midge Minium, MD;  Location: [facility];  Service: Endoscopy;  Laterality: N/A;   FOOT SURGERY     SHOULDER SURGERY Bilateral 2004   UPPER GI ENDOSCOPY      (Not in a hospital admission)  Social  History   Socioeconomic History   Marital status: Widowed    Spouse name: Not on file   Number of children: Not on file   Years of education: Not on file   Highest education level: Not on file  Occupational History   Not on file  Tobacco Use   Smoking status: Every Day    Types: E-cigarettes   Smokeless tobacco: Never  Vaping Use   Vaping status: Never Used  Substance and Sexual Activity   Alcohol use: No   Drug use: No   Sexual activity: Not Currently    Birth control/protection: Post-menopausal  Other Topics Concern   Not on file  Social History Narrative   Not on file   Social Drivers of Health   Financial Resource Strain: Not on file  Food Insecurity: Not on file  Transportation Needs: Not on file  Physical Activity: Not on file  Stress: Not on file  Social Connections: Not on file  Intimate Partner Violence: Not on file    Family History  Problem Relation Age of Onset   Hypertension Mother      Vitals:   07/30/23 0805 07/30/23 0845 07/30/23 0900 07/30/23 1030  BP:   111/69 117/70  Pulse: 67 76 95 69  Resp: 20 19 (!) 27   Temp:   98.1 F (36.7 C)   TempSrc:   Oral   SpO2: 100% 98% 90% 98%  Weight:      Height:  PHYSICAL EXAM General: Ill appearing elderly female, well nourished, in no acute distress. HEENT: Normocephalic and atraumatic. Neck: No JVD.  Lungs: Increased respiratory effort on 12L [place]. Course breath sounds bilaterally.  Heart: HRRR. Normal S1 and S2 without gallops or murmurs.  Abdomen: Non-distended appearing.  Msk: Normal strength and tone for age. Extremities: Warm and well perfused. No clubbing, cyanosis. No edema.  Neuro: Alert and oriented X 3. Psych: Answers questions appropriately.   Labs: Basic Metabolic Panel: Recent Labs    07/29/23 1959 07/30/23 0201  NA 130* 129*  K 3.7 3.9  CL 98 98  CO2 17* 19*  GLUCOSE 269* 231*  BUN 40* 43*  CREATININE 3.02* 2.97*  CALCIUM 7.3* 7.6*   Liver Function Tests: Recent  Labs    07/29/23 1341 07/30/23 0201  AST 50* 43*  ALT 35 31  ALKPHOS 106 94  BILITOT 0.6 0.5  PROT 6.1* 5.8*  ALBUMIN 3.4* 3.1*   Recent Labs    07/29/23 1341  LIPASE 21   CBC: Recent Labs    07/29/23 1341 07/30/23 0201  WBC 6.7 6.1  NEUTROABS 5.1  --   HGB 12.0 10.8*  HCT 36.5 33.1*  MCV 95.5 96.8  PLT 186 162   Cardiac Enzymes: Recent Labs    07/29/23 1341 07/29/23 1631 07/29/23 1959  TROPONINIHS 357* 478* 359*   BNP: Recent Labs    07/29/23 1341  BNP 425.1*   D-Dimer: No results for input(s): "DDIMER" in the last 72 hours. Hemoglobin A1C: No results for input(s): "HGBA1C" in the last 72 hours. Fasting Lipid Panel: No results for input(s): "CHOL", "HDL", "LDLCALC", "TRIG", "CHOLHDL", "LDLDIRECT" in the last 72 hours. Thyroid Function Tests: Recent Labs    07/29/23 1631  TSH 0.058*   Anemia Panel: No results for input(s): "VITAMINB12", "FOLATE", "FERRITIN", "TIBC", "IRON", "RETICCTPCT" in the last 72 hours.   Radiology: [facility] Chest Port 1 View Result Date: 07/29/2023 CLINICAL DATA:  Sepsis EXAM: PORTABLE CHEST 1 VIEW COMPARISON:  X-ray and CTA 07/26/2021.  Older x-rays as well FINDINGS: Enlarged cardiopericardial silhouette. Calcified aorta. Chronic interstitial changes with some hyperinflation. Slight prominence of central vasculature. No pneumothorax, effusion or consolidation. Overlapping cardiac leads. Curvature and degenerative changes of the spine. IMPRESSION: Enlarged cardiopericardial silhouette. Likely chronic appearing lung changes. Electronically Signed   By: Karen Kays M.D.   On: 07/29/2023 15:09    ECHO ordered  TELEMETRY reviewed by me 07/30/2023: sinus rhythm rate 90s  EKG reviewed by me: sinus rhythm PACs rate 87 bpm, nonischemic  Data reviewed by me 07/30/2023: last 24h vitals tele labs imaging I/O ED provider note, admission H&P  Principal Problem:   Acute respiratory failure with hypoxia and hypercapnia (HCC) Active Problems:    COPD with acute exacerbation (HCC)   Essential hypertension   Type 2 diabetes mellitus with stage 3 chronic kidney disease (HCC)   Sepsis (HCC)   Atrial fibrillation (HCC)   Chronic pain syndrome   NSTEMI (non-ST elevated myocardial infarction) (HCC)   Hypotension   Influenza A   AKI (acute kidney injury) (HCC)   Hypothyroidism    ASSESSMENT AND PLAN:  JONA ERKKILA is a 78 y.o. female  with a past medical history of COPD, hypertension, hyperlipidemia, DM2 who presented to the ED on 07/29/2023 for shortness of breath. Cardiology was consulted for further evaluation.   # Acute hypoxic respiratory failure # COPD # Demand ischemia # AKI Patient presenting with SOB, cough worsening for 2 weeks.  Lactic acid mildly  elevated at 2.0.  BNP 425.  Initial troponin 357.  EKG normal sinus rhythm without acute ischemic changes.  Chest x-ray with chronic appearing lung changes. Flu A positive. Troponins downtrending. Cr up to 3.0 since admission.  -Suspect troponin elevation 2/2 demand ischemia from acute respiratory failure and not ACS as patient remains chest pain free.  -Echo pending -Hold further diuresis given AKI.  -Heparin ordered by ED provider. Likely DC pending echo results.  -No plan for further cardiac diagnostics at this time.    This patient's plan of care was discussed and created with Dr. Darrold Junker and he is in agreement.  Signed: Gale Journey, PA-C  07/30/2023, 10:39 AM [name] Cardiology

## 2023-07-30 NOTE — ED Notes (Signed)
Pt repeatedly taking off BP cuff. Pt reports cannot tolerate BP being taken in upper arm due to cuff getting too tight. BP cuff placed on right forearm. RN able to obtain BP this way

## 2023-07-30 NOTE — Hospital Course (Addendum)
78 year old female past medical history of COPD, chronic pain, hypothyroidism, type 2 diabetes mellitus, hypertension, hyperlipidemia presented to the ED with shortness of breath.  She has been feeling unwell for the last week.  Her household is also all sick.  Developed shortness of breath within 24 hours of coming in and quickly worsened.  She is found to have a pulse ox of 89% on 12 L of high flow nasal cannula.  Found to be positive for influenza A.  Also had an elevated troponin but denies chest pain.  1/30.  Patient was on high flow nasal cannula 12 L and decrease down to 10 L.  Feels better than when she came in but still not feeling great. 1/31.  Creatinine improved to 1.02.  Patient still having shortness of breath.  Daughter concerned that Duragesic patch was held initially.  The order was in the computer as of yesterday.

## 2023-07-30 NOTE — ED Notes (Signed)
Pt given new gown and blanket

## 2023-07-30 NOTE — Assessment & Plan Note (Addendum)
Sodium 1 point less than the normal range.

## 2023-07-31 ENCOUNTER — Inpatient Hospital Stay
Admit: 2023-07-31 | Discharge: 2023-07-31 | Disposition: A | Discharge status: Home / Self Care | Payer: Medicare Other | Attending: Student | Admitting: Student

## 2023-07-31 DIAGNOSIS — I4891 Unspecified atrial fibrillation: Secondary | ICD-10-CM

## 2023-07-31 DIAGNOSIS — J9601 Acute respiratory failure with hypoxia: Secondary | ICD-10-CM | POA: Diagnosis not present

## 2023-07-31 DIAGNOSIS — I5A Non-ischemic myocardial injury (non-traumatic): Secondary | ICD-10-CM | POA: Diagnosis not present

## 2023-07-31 DIAGNOSIS — J101 Influenza due to other identified influenza virus with other respiratory manifestations: Secondary | ICD-10-CM | POA: Diagnosis not present

## 2023-07-31 DIAGNOSIS — J441 Chronic obstructive pulmonary disease with (acute) exacerbation: Secondary | ICD-10-CM | POA: Diagnosis not present

## 2023-07-31 LAB — CBC
HCT: 31.9 % — ABNORMAL LOW (ref 36.0–46.0)
Hemoglobin: 10.7 g/dL — ABNORMAL LOW (ref 12.0–15.0)
MCH: 31.1 pg (ref 26.0–34.0)
MCHC: 33.5 g/dL (ref 30.0–36.0)
MCV: 92.7 fL (ref 80.0–100.0)
Platelets: 180 10*3/uL (ref 150–400)
RBC: 3.44 MIL/uL — ABNORMAL LOW (ref 3.87–5.11)
RDW: 13.6 % (ref 11.5–15.5)
WBC: 9.2 10*3/uL (ref 4.0–10.5)
nRBC: 0 % (ref 0.0–0.2)

## 2023-07-31 LAB — ECHOCARDIOGRAM COMPLETE
AR max vel: 2.07 cm2
AV Area VTI: 1.84 cm2
AV Area mean vel: 1.89 cm2
AV Mean grad: 6 mm[Hg]
AV Peak grad: 11 mm[Hg]
Ao pk vel: 1.66 m/s
Area-P 1/2: 3.31 cm2
Height: 64 in
S' Lateral: 3.3 cm
Weight: 3440 [oz_av]

## 2023-07-31 LAB — BASIC METABOLIC PANEL
Anion gap: 9 (ref 5–15)
BUN: 30 mg/dL — ABNORMAL HIGH (ref 8–23)
CO2: 24 mmol/L (ref 22–32)
Calcium: 8.1 mg/dL — ABNORMAL LOW (ref 8.9–10.3)
Chloride: 105 mmol/L (ref 98–111)
Creatinine, Ser: 1.02 mg/dL — ABNORMAL HIGH (ref 0.44–1.00)
GFR, Estimated: 57 mL/min — ABNORMAL LOW (ref >60)
Glucose, Bld: 132 mg/dL — ABNORMAL HIGH (ref 70–99)
Potassium: 3.9 mmol/L (ref 3.5–5.1)
Sodium: 138 mmol/L (ref 135–145)

## 2023-07-31 LAB — GLUCOSE, CAPILLARY
Glucose-Capillary: 104 mg/dL — ABNORMAL HIGH (ref 70–99)
Glucose-Capillary: 160 mg/dL — ABNORMAL HIGH (ref 70–99)
Glucose-Capillary: 209 mg/dL — ABNORMAL HIGH (ref 70–99)
Glucose-Capillary: 173 mg/dL — ABNORMAL HIGH (ref 70–99)

## 2023-07-31 LAB — HEPARIN LEVEL (UNFRACTIONATED): Heparin Unfractionated: >1.1 [IU]/mL — ABNORMAL HIGH (ref 0.30–0.70)

## 2023-07-31 LAB — APTT: aPTT: 68 s — ABNORMAL HIGH (ref 24–36)

## 2023-07-31 MED ORDER — DILTIAZEM HCL 25 MG/5ML IV SOLN: 10.0000 mg | Freq: Four times a day (QID) | INTRAVENOUS | Status: DC | PRN

## 2023-07-31 MED ORDER — DILTIAZEM HCL 30 MG PO TABS
30.0000 mg | ORAL_TABLET | Freq: Four times a day (QID) | ORAL | Status: DC
  Administered 2023-07-31: 30 mg via ORAL
  Filled 2023-07-31 (×3): qty 1

## 2023-07-31 MED ORDER — DILTIAZEM HCL-DEXTROSE 125-5 MG/125ML-% IV SOLN (PREMIX)
5.0000 mg/h | INTRAVENOUS | Status: DC
  Administered 2023-07-31: 5 mg/h via INTRAVENOUS
  Filled 2023-07-31: qty 125

## 2023-07-31 MED ORDER — DILTIAZEM HCL 25 MG/5ML IV SOLN
15.0000 mg | Freq: Once | INTRAVENOUS | Status: AC
  Administered 2023-07-31: 15 mg via INTRAVENOUS
  Filled 2023-07-31: qty 5

## 2023-07-31 MED ORDER — FLUTICASONE FUROATE-VILANTEROL 100-25 MCG/ACT IN AEPB
1.0000 | INHALATION_SPRAY | Freq: Every day | RESPIRATORY_TRACT | Status: DC
  Administered 2023-08-01 – 2023-08-06 (×6): 1 via RESPIRATORY_TRACT
  Filled 2023-07-31: qty 28

## 2023-07-31 MED ORDER — FLUTICASONE PROPIONATE 50 MCG/ACT NA SUSP
2.0000 | Freq: Every day | NASAL | Status: DC
  Administered 2023-07-31 – 2023-08-06 (×6): 2 via NASAL
  Filled 2023-07-31: qty 16

## 2023-07-31 MED ORDER — DIGOXIN 0.25 MG/ML IJ SOLN: 0.2500 mg | Freq: Once | INTRAMUSCULAR | Status: DC

## 2023-07-31 MED ORDER — DILTIAZEM HCL 30 MG PO TABS
90.0000 mg | ORAL_TABLET | Freq: Once | ORAL | Status: AC
  Administered 2023-07-31: 90 mg via ORAL
  Filled 2023-07-31: qty 3

## 2023-07-31 NOTE — TOC Progression Note (Signed)
Transition of Care [facility]) - Progression Note    Patient Details  Name: Stephanie Small MRN: [id number] Date of Birth: 10-28-1945  Transition of Care [facility]) CM/SW Contact  Truddie Hidden, RN Phone Number: 07/31/2023, 1:29 PM  Clinical Narrative:    Message sent to [facility] from [place] to confirm [facility] acceptance. Patient fcare has been accepted.   Spoke with patient's daughter, Stephanie Small regarding RW. Stephanie Small states patient has several walkers at home and does not need a RW. Patricica advised [facility] will contact her within 48 hours of discharge to schedule appointment. Stephanie Small  has concerns about patient wearing pain patches. MD notified.          Expected Discharge Plan and Services                                               Social Determinants of Health (SDOH) Interventions SDOH Screenings   Food Insecurity: No Food Insecurity (07/31/2023)  Housing: Low Risk  (07/31/2023)  Transportation Needs: No Transportation Needs (07/31/2023)  Utilities: At Risk (07/31/2023)  Social Connections: Moderately Isolated (07/31/2023)  Tobacco Use: High Risk (05/19/2023)   Received from [facility]    Readmission Risk Interventions    07/30/2023    4:47 PM  Readmission Risk Prevention Plan  Transportation Screening Complete  PCP or Specialist Appt within 3-5 Days Complete  HRI or Home Care Consult Complete  Social Work Consult for Recovery Care Planning/Counseling Complete  Palliative Care Screening Not Applicable  Medication Review [facility]) Complete

## 2023-07-31 NOTE — Plan of Care (Signed)

## 2023-07-31 NOTE — Progress Notes (Signed)
Called with rapid A-fib and heart rate in the 140s.  Patient feels a little flushed and a little anxious.  Physical Exam Cardiovascular:     Rate and Rhythm: Tachycardia present. Rhythm irregularly irregular.     Heart sounds: Normal heart sounds, S1 normal and S2 normal.  Pulmonary:     Breath sounds: Examination of the right-middle field reveals decreased breath sounds. Examination of the left-middle field reveals decreased breath sounds. Examination of the right-lower field reveals decreased breath sounds and rhonchi. Examination of the left-lower field reveals decreased breath sounds and rhonchi. Decreased breath sounds and rhonchi present.     Plan push Cardizem 15 mg IV once and start oral Cardizem 90 mg oral once and then 30 mg every 6 after that.  Atrial fibrillation rapid rate likely brought on with influenza and bronchospasm.  Dr. Alford Highland Case discussed with nursing staff and pharmacist. 20 minutes

## 2023-07-31 NOTE — Consult Note (Signed)
Pharmacy Consult Note - Anticoagulation  Pharmacy Consult for heparin Indication: chest pain/ACS  PATIENT MEASUREMENTS: Height: 5\' 4"  (162.6 cm) Weight: 97.5 kg (215 lb) IBW/kg (Calculated) : 54.7 HEPARIN DW (KG): 77.1  VITAL SIGNS: Temp: 98.2 F (36.8 C) (01/30 2143) Temp Source: Oral (01/30 2143) BP: 113/63 (01/30 2143) Pulse Rate: 63 (01/30 2143)  Recent Labs    07/29/23 1341 07/29/23 1631 07/29/23 1959 07/30/23 0201 07/30/23 1445 07/30/23 2255  HGB 12.0  --   --  10.8*  --   --   HCT 36.5  --   --  33.1*  --   --   PLT 186  --   --  162  --   --   APTT 47*  --   --  62*   < > 73*  LABPROT 20.8*  --   --   --   --   --   INR 1.8*  --   --   --   --   --   HEPARINUNFRC  --    < >  --  >1.10*  --   --   CREATININE 2.70*  --  3.02* 2.97*  --   --   TROPONINIHS 357*   < > 359*  --   --   --    < > = values in this interval not displayed.    Estimated Creatinine Clearance: 18.6 mL/min (A) (by C-G formula based on SCr of 2.97 mg/dL (H)).  PAST MEDICAL HISTORY: Past Medical History:  Diagnosis Date   Arthritis    Atypical squamous cells of undetermined significance (ASCUS) on Papanicolaou smear of cervix 10/08/2016   Chest pain 09/20/2020   COPD (chronic obstructive pulmonary disease) (HCC)    Diabetes mellitus without complication (HCC)    Diarrhea 01/09/2021   Elevated troponin    Glaucoma 2010   History of adenomatous polyp of colon    Hypertension 2005   Hypomagnesemia 01/09/2021   Knee pain 05/06/2022   Low back pain of over 3 months duration 05/06/2022   Personal history of colonic polyps    Respiratory distress 10/25/2015   Sepsis (HCC) 01/09/2021   Thyroid cyst    UTI (urinary tract infection) 01/09/2021   Wheezing 10/25/2015    ASSESSMENT: 78 y.o. female with PMH including Afib, COPD, HTN2 is presenting with elevated troponins, concerning for ACS. Patient is on chronic anticoagulation with Eliquis for Afib per chart review, last dose was this  morning. CHA2DS2VASc is at least 1 (age +68, sex, HTN). Pharmacy has been consulted to initiate and manage heparin intravenous infusion. Patient will need ECHO before stopping heparin.  Pertinent medications: Eliquis 5 mg twice daily, last dose was this morning  Goal(s) of therapy: Heparin level 0.3 - 0.7 units/mL aPTT 66 - 102 seconds Monitor platelets by anticoagulation protocol: Yes   Baseline anticoagulation labs: Recent Labs    07/29/23 1341 07/30/23 0201 07/30/23 1445 07/30/23 2255  APTT 47* 62* 76* 73*  INR 1.8*  --   --   --   HGB 12.0 10.8*  --   --   PLT 186 162  --   --     Date Time aPTT/HL Rate/Comment 1/30     0201    62/>1.10        900 units/hr, SUBtherapeutic 1/30 1445 76  Therapeutic x 1 1/30     2255    73  Therapeutic X 2    PLAN: 1/30 @ 2255:   aPTT = 73, therapeutic X 2 - Will continue pt on current rate and recheck HL and aPTT on 1/31 with AM labs Continue to titrate by aPTT until heparin level and aPTT correlate and/or Eliquis washes out, then titrate by heparin level alone. Continue to monitor CBC daily while on heparin infusion.   Rennee Coyne D Clinical Pharmacist 07/31/2023 12:38 AM

## 2023-07-31 NOTE — Progress Notes (Signed)
[facility] Cardiology    SUBJECTIVE: Patient resting comfortably in bed some dyspnea shortness of breath no significant pain no leg edema.  Patient feels like he is improving   Vitals:   07/31/23 0515 07/31/23 0821 07/31/23 0826 07/31/23 1147  BP: 111/68 122/68  114/74  Pulse: 72 65 69 87  Resp: 20  (!) 21   Temp: 98 F (36.7 C) 98.2 F (36.8 C)  98.2 F (36.8 C)  TempSrc: Oral Oral  Oral  SpO2: 96% 97% 96% 92%  Weight:      Height:        No intake or output data in the 24 hours ending 07/31/23 1341    PHYSICAL EXAM  General: Well developed, well nourished, in no acute distress HEENT:  Normocephalic and atramatic Neck:  No JVD.  Lungs: Clear bilaterally to auscultation and percussion. Heart: HRRR . Normal S1 and S2 without gallops or murmurs.  Abdomen: Bowel sounds are positive, abdomen soft and non-tender  Msk:  Back normal, normal gait. Normal strength and tone for age. Extremities: No clubbing, cyanosis or edema.   Neuro: Alert and oriented X 3. Psych:  Good affect, responds appropriately   LABS: Basic Metabolic Panel: Recent Labs    07/30/23 0201 07/31/23 0536  NA 129* 138  K 3.9 3.9  CL 98 105  CO2 19* 24  GLUCOSE 231* 132*  BUN 43* 30*  CREATININE 2.97* 1.02*  CALCIUM 7.6* 8.1*   Liver Function Tests: Recent Labs    07/29/23 1341 07/30/23 0201  AST 50* 43*  ALT 35 31  ALKPHOS 106 94  BILITOT 0.6 0.5  PROT 6.1* 5.8*  ALBUMIN 3.4* 3.1*   Recent Labs    07/29/23 1341  LIPASE 21   CBC: Recent Labs    07/29/23 1341 07/30/23 0201 07/31/23 0536  WBC 6.7 6.1 9.2  NEUTROABS 5.1  --   --   HGB 12.0 10.8* 10.7*  HCT 36.5 33.1* 31.9*  MCV 95.5 96.8 92.7  PLT 186 162 180   Cardiac Enzymes: No results for input(s): "CKTOTAL", "CKMB", "CKMBINDEX", "TROPONINI" in the last 72 hours. BNP: Invalid input(s): "POCBNP" D-Dimer: No results for input(s): "DDIMER" in the last 72 hours. Hemoglobin A1C: No results for input(s): "HGBA1C" in the last 72  hours. Fasting Lipid Panel: No results for input(s): "CHOL", "HDL", "LDLCALC", "TRIG", "CHOLHDL", "LDLDIRECT" in the last 72 hours. Thyroid Function Tests: Recent Labs    07/29/23 1631  TSH 0.058*   Anemia Panel: No results for input(s): "VITAMINB12", "FOLATE", "FERRITIN", "TIBC", "IRON", "RETICCTPCT" in the last 72 hours.  DG Chest Port 1 View Result Date: 07/29/2023 CLINICAL DATA:  Sepsis EXAM: PORTABLE CHEST 1 VIEW COMPARISON:  X-ray and CTA 07/26/2021.  Older x-rays as well FINDINGS: Enlarged cardiopericardial silhouette. Calcified aorta. Chronic interstitial changes with some hyperinflation. Slight prominence of central vasculature. No pneumothorax, effusion or consolidation. Overlapping cardiac leads. Curvature and degenerative changes of the spine. IMPRESSION: Enlarged cardiopericardial silhouette. Likely chronic appearing lung changes. Electronically Signed   By: Karen Kays M.D.   On: 07/29/2023 15:09     Echo preserved left ventricular function EF of at least 60%  TELEMETRY: Normal sinus rhythm rate around 75:  ASSESSMENT AND PLAN:  Principal Problem:   Acute respiratory failure with hypoxia and hypercapnia (HCC) Active Problems:   COPD exacerbation (HCC)   Essential hypertension   Type 2 diabetes mellitus with stage 3 chronic kidney disease (HCC)   Atrial fibrillation (HCC)   Chronic pain syndrome  Hypotension   Influenza A   AKI (acute kidney injury) (HCC)   Hypothyroidism   Myocardial injury   Hyponatremia    Plan Acute respiratory hypoxic failure secondary to flu A continue current therapy respiratory support pulmonary inhalers supplemental oxygen as necessary COPD with exacerbation related to fluid infection continue current therapy consider steroids inhalers antibiotics Demand ischemia with elevated troponin doubt primarily cardiac this seems like a secondary type II demand ischemia situation recommend conservative management Hold further diuresis because  of acute renal insufficiency Agree with echocardiogram for further assessment Follow-up renal insufficiency consider nephrology input   Alwyn Pea, MD, 07/31/2023 1:41 PM

## 2023-07-31 NOTE — Progress Notes (Incomplete)
MEWS Progress Note  Patient Details Name: Stephanie Small MRN: [id number] DOB: 18-Oct-1945 Today's Date: 07/31/2023   MEWS Flowsheet Documentation:  Assess: MEWS Score Temp: 98.3 F (36.8 C) BP: 134/88 MAP (mmHg): 100 Pulse Rate: (!) 144 ECG Heart Rate: 71 Resp: (!) 24 Level of Consciousness: Alert SpO2: (!) 87 % O2 Device: Nasal Cannula Patient Activity (if Appropriate): In bed Heater temperature: 87.8 F (31 C) O2 Flow Rate (L/min): 8 L/min FiO2 (%): 100 % Assess: MEWS Score MEWS Temp: 0 MEWS Systolic: 0 MEWS Pulse: 3 MEWS RR: 1 MEWS LOC: 0 MEWS Score: 4 MEWS Score Color: Red Assess: SIRS CRITERIA SIRS Temperature : 0 SIRS Respirations : 1 SIRS Pulse: 1 SIRS WBC: 0 SIRS Score Sum : 2 SIRS Temperature : 0 SIRS Pulse: 1 SIRS Respirations : 1 SIRS WBC: 0 SIRS Score Sum : 2 Assess: if the MEWS score is Yellow or Red Were vital signs accurate and taken at a resting state?: Yes Does the patient meet 2 or more of the SIRS criteria?: Yes Does the patient have a confirmed or suspected source of infection?: No MEWS guidelines implemented : Yes, red Treat MEWS Interventions: Considered administering scheduled or prn medications/treatments as ordered Take Vital Signs Increase Vital Sign Frequency : Red: Q1hr x2, continue Q4hrs until patient remains green for 12hrs Escalate MEWS: Escalate: Red: Discuss with charge nurse and notify provider. Consider notifying RRT. If remains red for 2 hours consider need for higher level of care Notify: Charge Nurse/RN Name of Charge Nurse/RN Notified: Buyer, retail, rn Provider Notification Provider Name/Title: dr. Renae Gloss Date Provider Notified: 07/31/23 Time Provider Notified: 1810 Method of Notification: Page Notification Reason: Change in status (red mews) Provider response: See new orders Date of Provider Response: 07/31/23 Time of Provider Response: 1810 Notify: Rapid Response Name of Rapid Response RN Notified: none Date  Rapid Response Notified: 07/31/23 Time Rapid Response Notified: 1810      [name] 07/31/2023, 6:10 PM

## 2023-07-31 NOTE — Progress Notes (Signed)
Progress Note   Patient: Stephanie Small UJW:[id number] DOB: 07-May-1946 DOA: 07/29/2023     2 DOS: the patient was seen and examined on 07/31/2023   Brief hospital course: 78 year old female past medical history of COPD, chronic pain, hypothyroidism, type 2 diabetes mellitus, hypertension, hyperlipidemia presented to the ED with shortness of breath.  She has been feeling unwell for the last week.  Her household is also all sick.  Developed shortness of breath within 24 hours of coming in and quickly worsened.  She is found to have a pulse ox of 89% on 12 L of high flow nasal cannula.  Found to be positive for influenza A.  Also had an elevated troponin but denies chest pain.  1/30.  Patient was on high flow nasal cannula 12 L and decrease down to 10 L.  Feels better than when she came in but still not feeling great. 1/31.  Creatinine improved to 1.02.  Patient still having shortness of breath.  Daughter concerned that Duragesic patch was held initially.  The order was in the computer as of yesterday.  Assessment and Plan: * Acute respiratory failure with hypoxia and hypercapnia (HCC) Severe hypoxia with oxygen saturation as low as 70% and mild hypercapnia with pCO2 48 in the setting of COPD exacerbation, influenza A, and dehydration.    Patient still on high flow nasal cannula 10 L as of this morning.  COPD exacerbation (HCC) Secondary to influenza A. Continue 40 mg of Solu-Medrol daily. Continue DuoNeb   Influenza A With pneumonia, Influenza A PCR positive. Continue renally dosed Tamiflu 30 mg daily Sepsis ruled out did not meet all of the SIRS criteria.  Myocardial injury Secondary to demand ischemia with acute respiratory failure.  Patient on heparin drip.  Cardiology following.  Echocardiogram ordered.  No complaints of chest pain.  Hypotension Improved  AKI (acute kidney injury) (HCC) On chronic kidney disease stage IIIa.  Creatinine peaked at 3.02.  Today's creatinine down  to 1.02.  Hyponatremia Resolved.  Likely secondary to dehydration  Hypothyroidism Continue Synthroid at lower dose with TSH on the lower side.  Chronic pain syndrome Continue home fentanyl patch, as needed oxycodone.  Clarified with nursing staff and pharmacist that okay to give fentanyl patch.  Atrial fibrillation (HCC) Paroxysmal in nature.  Hold home Eliquis with heparin infusion  Type 2 diabetes mellitus with stage 3 chronic kidney disease (HCC) Hemoglobin A1c 7.3.  On low-dose Semglee insulin and sliding scale.  Essential hypertension Holding hypertensive medications.        Subjective: Patient feeling a little bit better.  Still having some difficulty breathing but better than when she came in.  Still on high flow nasal cannula.  Kidney function much improved.  Daughter concerned that she has been well without the fentanyl patch.  I mentioned that it is ordered.  Spoke with pharmacy and nursing staff to get a patch.  Physical Exam: Vitals:   07/31/23 0515 07/31/23 0821 07/31/23 0826 07/31/23 1147  BP: 111/68 122/68  114/74  Pulse: 72 65 69 87  Resp: 20  (!) 21   Temp: 98 F (36.7 C) 98.2 F (36.8 C)  98.2 F (36.8 C)  TempSrc: Oral Oral  Oral  SpO2: 96% 97% 96% 92%  Weight:      Height:       Physical Exam HENT:     Head: Normocephalic.     Mouth/Throat:     Pharynx: No oropharyngeal exudate.  Eyes:     General:  Lids are normal.     Conjunctiva/sclera: Conjunctivae normal.  Cardiovascular:     Rate and Rhythm: Normal rate and regular rhythm.     Heart sounds: Normal heart sounds, S1 normal and S2 normal.  Pulmonary:     Breath sounds: Examination of the right-lower field reveals decreased breath sounds and rhonchi. Examination of the left-lower field reveals decreased breath sounds and rhonchi. Decreased breath sounds and rhonchi present. No rales.  Abdominal:     Palpations: Abdomen is soft.     Tenderness: There is no abdominal tenderness.   Musculoskeletal:     Right lower leg: No swelling.     Left lower leg: No swelling.  Skin:    General: Skin is warm.     Findings: No rash.  Neurological:     Mental Status: She is alert and oriented to person, place, and time.     Data Reviewed: Creatinine 1.02, sodium 138, hemoglobin 10.7, white blood count 9.2, platelet count 180  Family Communication: Spoke with son at the bedside this morning and daughter on the phone this afternoon  Disposition: Status is: Inpatient Remains inpatient appropriate because: Patient on high flow nasal cannula 10 L this morning  Planned Discharge Destination: Home with Home Health    Time spent: 28 minutes  Author: Alford Highland, MD 07/31/2023 2:15 PM  For on call review [email or web address].

## 2023-08-01 ENCOUNTER — Inpatient Hospital Stay: Payer: Medicare Other

## 2023-08-01 DIAGNOSIS — E038 Other specified hypothyroidism: Secondary | ICD-10-CM

## 2023-08-01 DIAGNOSIS — N189 Chronic kidney disease, unspecified: Secondary | ICD-10-CM

## 2023-08-01 DIAGNOSIS — I4891 Unspecified atrial fibrillation: Secondary | ICD-10-CM | POA: Diagnosis not present

## 2023-08-01 DIAGNOSIS — J9601 Acute respiratory failure with hypoxia: Secondary | ICD-10-CM | POA: Diagnosis not present

## 2023-08-01 DIAGNOSIS — J101 Influenza due to other identified influenza virus with other respiratory manifestations: Secondary | ICD-10-CM | POA: Diagnosis not present

## 2023-08-01 DIAGNOSIS — J441 Chronic obstructive pulmonary disease with (acute) exacerbation: Secondary | ICD-10-CM | POA: Diagnosis not present

## 2023-08-01 LAB — GLUCOSE, CAPILLARY
Glucose-Capillary: 168 mg/dL — ABNORMAL HIGH (ref 70–99)
Glucose-Capillary: 206 mg/dL — ABNORMAL HIGH (ref 70–99)
Glucose-Capillary: 240 mg/dL — ABNORMAL HIGH (ref 70–99)
Glucose-Capillary: 199 mg/dL — ABNORMAL HIGH (ref 70–99)

## 2023-08-01 LAB — RESPIRATORY PANEL BY PCR

## 2023-08-01 LAB — APTT: aPTT: 46 s — ABNORMAL HIGH (ref 24–36)

## 2023-08-01 LAB — HEPARIN LEVEL (UNFRACTIONATED): Heparin Unfractionated: 1.01 [IU]/mL — ABNORMAL HIGH (ref 0.30–0.70)

## 2023-08-01 MED ORDER — APIXABAN 5 MG PO TABS
5.0000 mg | ORAL_TABLET | Freq: Two times a day (BID) | ORAL | Status: DC
  Administered 2023-08-01 – 2023-08-06 (×11): 5 mg via ORAL
  Filled 2023-08-01 (×11): qty 1

## 2023-08-01 MED ORDER — HEPARIN BOLUS VIA INFUSION
2300.0000 [IU] | Freq: Once | INTRAVENOUS | Status: AC
  Administered 2023-08-01: 2300 [IU] via INTRAVENOUS
  Filled 2023-08-01: qty 2300

## 2023-08-01 MED ORDER — DILTIAZEM HCL 30 MG PO TABS
60.0000 mg | ORAL_TABLET | Freq: Three times a day (TID) | ORAL | Status: DC
  Administered 2023-08-01 – 2023-08-03 (×7): 60 mg via ORAL
  Filled 2023-08-01 (×7): qty 2

## 2023-08-01 NOTE — Progress Notes (Signed)
Progress Note   Patient: Stephanie Small:[id number] DOB: 11-04-1945 DOA: 07/29/2023     3 DOS: the patient was seen and examined on 08/01/2023   Brief hospital course: 78 year old female past medical history of COPD, chronic pain, hypothyroidism, type 2 diabetes mellitus, hypertension, hyperlipidemia presented to the ED with shortness of breath.  She has been feeling unwell for the last week.  Her household is also all sick.  Developed shortness of breath within 24 hours of coming in and quickly worsened.  She is found to have a pulse ox of 89% on 12 L of high flow nasal cannula.  Found to be positive for influenza A.  Also had an elevated troponin but denies chest pain.  1/30.  Patient was on high flow nasal cannula 12 L and decrease down to 10 L.  Feels better than when she came in but still not feeling great. 1/31.  Creatinine improved to 1.02.  Patient still having shortness of breath.  Daughter concerned that Duragesic patch was held initially.  The order was in the computer as of yesterday.  Patient went into rapid atrial fibrillation in the late afternoon requiring to be put on Cardizem drip. 2/1.  Will switch heparin drip back to Eliquis.  Will try to taper off Cardizem drip by starting oral Cardizem since heart rate better controlled.  Assessment and Plan: * Acute respiratory failure with hypoxia and hypercapnia (HCC) Severe hypoxia with oxygen saturation as low as 70% and mild hypercapnia with pCO2 48 in the setting of COPD exacerbation, influenza A, and dehydration.    Patient still on high flow nasal cannula 8 L as of this morning.  COPD exacerbation (HCC) Secondary to influenza A. Continue 40 mg of Solu-Medrol daily. Continue DuoNeb   Influenza A With pneumonia, Influenza A PCR positive. Continue renally dosed Tamiflu 30 mg daily Sepsis ruled out did not meet all of the SIRS criteria.  Atrial fibrillation with rapid ventricular response (HCC) Switch heparin back to  Eliquis today.  Required Cardizem drip yesterday will try to convert off Cardizem drip with oral Cardizem starting today.  Myocardial injury Secondary to demand ischemia with acute respiratory failure.  Cardiology does not recommend any intervention.  Echocardiogram showed an EF of 60 to 65%.  Hypotension Improved  Acute kidney injury superimposed on CKD (HCC) On chronic kidney disease stage IIIa.  Creatinine peaked at 3.02.  Last creatinine down to 1.02.  Hyponatremia Resolved.  Likely secondary to dehydration  Hypothyroidism Continue Synthroid at lower dose with TSH on the lower side.  Chronic pain syndrome Continue home fentanyl patch, as needed oxycodone.   Type 2 diabetes mellitus with stage 3 chronic kidney disease (HCC) Hemoglobin A1c 7.3.  On low-dose Semglee insulin and sliding scale.  Essential hypertension Started Cardizem secondary to rapid atrial fibrillation.        Subjective: Patient feeling better this morning.  States her breathing is a little bit better.  Does not feel her heart beating fast.  Heart rate better controlled today.  Admitted with acute respiratory failure and influenza infection.  Physical Exam: Vitals:   08/01/23 0848 08/01/23 0856 08/01/23 1204 08/01/23 1350  BP:  116/67 135/88   Pulse:  (!) 102 85   Resp:  17 18   Temp:  97.9 F (36.6 C) 98 F (36.7 C)   TempSrc:      SpO2: 90% 91% 94% 90%  Weight:      Height:       Physical Exam  HENT:     Head: Normocephalic.     Mouth/Throat:     Pharynx: No oropharyngeal exudate.  Eyes:     General: Lids are normal.     Conjunctiva/sclera: Conjunctivae normal.  Cardiovascular:     Rate and Rhythm: Normal rate. Rhythm irregularly irregular.     Heart sounds: Normal heart sounds, S1 normal and S2 normal.  Pulmonary:     Breath sounds: Examination of the right-middle field reveals decreased breath sounds. Examination of the left-middle field reveals decreased breath sounds. Examination of  the right-lower field reveals decreased breath sounds and rhonchi. Examination of the left-lower field reveals decreased breath sounds and rhonchi. Decreased breath sounds and rhonchi present. No rales.  Abdominal:     Palpations: Abdomen is soft.     Tenderness: There is no abdominal tenderness.  Musculoskeletal:     Right lower leg: Swelling present.     Left lower leg: Swelling present.  Skin:    General: Skin is warm.     Findings: No rash.  Neurological:     Mental Status: She is alert and oriented to person, place, and time.     Data Reviewed: Chest x-ray showing increased bilateral scattered infiltrates and large cardio pericardial silhouette EF 60 to 65% Last creatinine 1.02, last hemoglobin 10.7  Family Communication: Daughter at bedside  Disposition: Status is: Inpatient Remains inpatient appropriate because: Still on high flow nasal cannula  Planned Discharge Destination: Home    Time spent: 28 minutes  Author: Alford Highland, MD 08/01/2023 3:04 PM  For on call review [email or web address].

## 2023-08-01 NOTE — Consult Note (Signed)
PHARMACY - ANTICOAGULATION CONSULT NOTE  Pharmacy Consult for Apixaban Indication: atrial fibrillation  Allergies  Allergen Reactions   Shellfish-Derived Products Anaphylaxis and Rash    Allergic to oysters, not sure if she's allergic to shellfish.  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   Shellfish Allergy Rash   Patient Measurements: Height: 5\' 4"  (162.6 cm) Weight: 100.1 kg (220 lb 10.9 oz) IBW/kg (Calculated) : 54.7 Heparin Dosing Weight: NA  Vital Signs: Temp: 97.9 F (36.6 C) (02/01 0856) Temp Source: Oral (02/01 0421) BP: 116/67 (02/01 0856) Pulse Rate: 102 (02/01 0856)  Labs: Recent Labs    07/29/23 1341 07/29/23 1341 07/29/23 1631 07/29/23 1959 07/30/23 0201 07/30/23 1445 07/30/23 2255 07/31/23 0536 08/01/23 0443  HGB 12.0  --   --   --  10.8*  --   --  10.7*  --   HCT 36.5  --   --   --  33.1*  --   --  31.9*  --   PLT 186  --   --   --  162  --   --  180  --   APTT 47*  --   --   --  62*   < > 73* 68* 46*  LABPROT 20.8*  --   --   --   --   --   --   --   --   INR 1.8*  --   --   --   --   --   --   --   --   HEPARINUNFRC  --    < > >1.10*  --  >1.10*  --   --  >1.10* 1.01*  CREATININE 2.70*  --   --  3.02* 2.97*  --   --  1.02*  --   TROPONINIHS 357*  --  478* 359*  --   --   --   --   --    < > = values in this interval not displayed.   Estimated Creatinine Clearance: 54.8 mL/min (A) (by C-G formula based on SCr of 1.02 mg/dL (H)).  Medical History: Past Medical History:  Diagnosis Date   Arthritis    Atypical squamous cells of undetermined significance (ASCUS) on Papanicolaou smear of cervix 10/08/2016   Chest pain 09/20/2020   COPD (chronic obstructive pulmonary disease) (HCC)    Diabetes mellitus without complication (HCC)    Diarrhea 01/09/2021   Elevated troponin    Glaucoma 2010   History of adenomatous polyp of colon    Hypertension 2005   Hypomagnesemia 01/09/2021   Knee pain 05/06/2022   Low back pain of  over 3 months duration 05/06/2022   Personal history of colonic polyps    Respiratory distress 10/25/2015   Sepsis (HCC) 01/09/2021   Thyroid cyst    UTI (urinary tract infection) 01/09/2021   Wheezing 10/25/2015   Assessment: MH is a 78 yo female who presented to the ED due to chest pain, shortness of breath that was worsening. They had elevated troponin and were put on a heparin gtt. CHA2DS2VASc score of 4 (age, sex, HTN). Provider consulted pharmacy to restart their home apixban dosing. They are taking apixaban 5 mg BID (age < 80, Scr < 1.5 and Wt > 60 kg).   Plan:  Stopped the heparin gtt, discontinued heparin level and aPTT; and restarted apixaban 5 mg BID once heparin gtt was discontinued.   Effie Shy, PharmD Pharmacy Resident  08/01/2023 11:48  AM

## 2023-08-01 NOTE — Consult Note (Signed)
Pharmacy Consult Note - Anticoagulation  Pharmacy Consult for heparin Indication: chest pain/ACS  PATIENT MEASUREMENTS: Height: 5\' 4"  (162.6 cm) Weight: 100.1 kg (220 lb 10.9 oz) IBW/kg (Calculated) : 54.7 HEPARIN DW (KG): 77.1  VITAL SIGNS: Temp: 97.9 F (36.6 C) (02/01 0421) Temp Source: Oral (02/01 0421) BP: 117/78 (02/01 0421) Pulse Rate: 81 (02/01 0421)  Recent Labs    07/29/23 1341 07/29/23 1631 07/29/23 1959 07/30/23 0201 07/31/23 0536 08/01/23 0443  HGB 12.0  --   --    < > 10.7*  --   HCT 36.5  --   --    < > 31.9*  --   PLT 186  --   --    < > 180  --   APTT 47*  --   --    < > 68* 46*  LABPROT 20.8*  --   --   --   --   --   INR 1.8*  --   --   --   --   --   HEPARINUNFRC  --    < >  --    < > >1.10* 1.01*  CREATININE 2.70*  --  3.02*   < > 1.02*  --   TROPONINIHS 357*   < > 359*  --   --   --    < > = values in this interval not displayed.    Estimated Creatinine Clearance: 54.8 mL/min (A) (by C-G formula based on SCr of 1.02 mg/dL (H)).  PAST MEDICAL HISTORY: Past Medical History:  Diagnosis Date   Arthritis    Atypical squamous cells of undetermined significance (ASCUS) on Papanicolaou smear of cervix 10/08/2016   Chest pain 09/20/2020   COPD (chronic obstructive pulmonary disease) (HCC)    Diabetes mellitus without complication (HCC)    Diarrhea 01/09/2021   Elevated troponin    Glaucoma 2010   History of adenomatous polyp of colon    Hypertension 2005   Hypomagnesemia 01/09/2021   Knee pain 05/06/2022   Low back pain of over 3 months duration 05/06/2022   Personal history of colonic polyps    Respiratory distress 10/25/2015   Sepsis (HCC) 01/09/2021   Thyroid cyst    UTI (urinary tract infection) 01/09/2021   Wheezing 10/25/2015    ASSESSMENT: 78 y.o. female with PMH including Afib, COPD, HTN2 is presenting with elevated troponins, concerning for ACS. Patient is on chronic anticoagulation with Eliquis for Afib per chart review, last  dose was this morning. CHA2DS2VASc is at least 55 (age +37, sex, HTN). Pharmacy has been consulted to initiate and manage heparin intravenous infusion.   Pertinent medications: Eliquis 5 mg twice daily.   Goal(s) of therapy: Heparin level 0.3 - 0.7 units/mL aPTT 66 - 102 seconds Monitor platelets by anticoagulation protocol: Yes   Baseline anticoagulation labs: Recent Labs    07/29/23 1341 07/30/23 0201 07/30/23 1445 07/30/23 2255 07/31/23 0536 08/01/23 0443  APTT 47* 62*   < > 73* 68* 46*  INR 1.8*  --   --   --   --   --   HGB 12.0 10.8*  --   --  10.7*  --   PLT 186 162  --   --  180  --    < > = values in this interval not displayed.    Date Time aPTT/HL Rate/Comment 1/30     0201    62/>1.10        900 units/hr, SUBtherapeutic 1/30 1445  76  Therapeutic x 1 1/30     2255    73                   Therapeutic X 2  2/1 0443 46/1/01 Subtherapeutic.    PLAN: Heparin level is subtherapeutic. Will give heparin bolus 2300 units x 1 and increase heparin infusion to 1200 units/hr. Recheck aPTT in 8 hours. Heparin level and CBC daily while on heparin infusion. Switch to heparin level once aPTT and heparin level correlate.    Ronnald Ramp, PharmD, BCPS Clinical Pharmacist 08/01/2023 7:04 AM

## 2023-08-01 NOTE — Plan of Care (Signed)
  Problem: Education: Goal: Ability to describe self-care measures that may prevent or decrease complications (Diabetes Survival Skills Education) will improve Outcome: Not Progressing Goal: Individualized Educational Video(s) Outcome: Not Progressing   Problem: Coping: Goal: Ability to adjust to condition or change in health will improve Outcome: Not Progressing   Problem: Fluid Volume: Goal: Ability to maintain a balanced intake and output will improve Outcome: Not Progressing   Problem: Health Behavior/Discharge Planning: Goal: Ability to identify and utilize available resources and services will improve Outcome: Not Progressing Goal: Ability to manage health-related needs will improve Outcome: Not Progressing   Problem: Metabolic: Goal: Ability to maintain appropriate glucose levels will improve Outcome: Not Progressing   Problem: Nutritional: Goal: Maintenance of adequate nutrition will improve Outcome: Not Progressing Goal: Progress toward achieving an optimal weight will improve Outcome: Not Progressing   Problem: Skin Integrity: Goal: Risk for impaired skin integrity will decrease Outcome: Not Progressing   Problem: Tissue Perfusion: Goal: Adequacy of tissue perfusion will improve Outcome: Not Progressing   Problem: Education: Goal: Knowledge of General Education information will improve Description: Including pain rating scale, medication(s)/side effects and non-pharmacologic comfort measures Outcome: Not Progressing   Problem: Health Behavior/Discharge Planning: Goal: Ability to manage health-related needs will improve Outcome: Not Progressing   Problem: Clinical Measurements: Goal: Ability to maintain clinical measurements within normal limits will improve Outcome: Not Progressing Goal: Will remain free from infection Outcome: Not Progressing Goal: Diagnostic test results will improve Outcome: Not Progressing Goal: Respiratory complications will  improve Outcome: Not Progressing Goal: Cardiovascular complication will be avoided Outcome: Not Progressing   Problem: Activity: Goal: Risk for activity intolerance will decrease Outcome: Not Progressing   Problem: Nutrition: Goal: Adequate nutrition will be maintained Outcome: Not Progressing   Problem: Coping: Goal: Level of anxiety will decrease Outcome: Not Progressing   Problem: Elimination: Goal: Will not experience complications related to bowel motility Outcome: Not Progressing Goal: Will not experience complications related to urinary retention Outcome: Not Progressing   Problem: Pain Managment: Goal: General experience of comfort will improve and/or be controlled Outcome: Not Progressing   Problem: Safety: Goal: Ability to remain free from injury will improve Outcome: Not Progressing   Problem: Skin Integrity: Goal: Risk for impaired skin integrity will decrease Outcome: Not Progressing   Problem: Education: Goal: Knowledge of disease or condition will improve Outcome: Not Progressing Goal: Knowledge of the prescribed therapeutic regimen will improve Outcome: Not Progressing Goal: Individualized Educational Video(s) Outcome: Not Progressing   Problem: Activity: Goal: Ability to tolerate increased activity will improve Outcome: Not Progressing Goal: Will verbalize the importance of balancing activity with adequate rest periods Outcome: Not Progressing   Problem: Respiratory: Goal: Ability to maintain a clear airway will improve Outcome: Not Progressing Goal: Levels of oxygenation will improve Outcome: Not Progressing Goal: Ability to maintain adequate ventilation will improve Outcome: Not Progressing

## 2023-08-01 NOTE — Progress Notes (Signed)
[facility] Cardiology  SUBJECTIVE: Patient sitting on side of the bed, reports improved breathing   Vitals:   08/01/23 0421 08/01/23 0425 08/01/23 0848 08/01/23 0856  BP: 117/78   116/67  Pulse: 81   (!) 122  Resp: 20     Temp: 97.9 F (36.6 C)   97.9 F (36.6 C)  TempSrc: Oral     SpO2: 92%  90% 91%  Weight:  100.1 kg    Height:         Intake/Output Summary (Last 24 hours) at 08/01/2023 1610 Last data filed at 08/01/2023 0400 Gross per 24 hour  Intake 627.36 ml  Output --  Net 627.36 ml      PHYSICAL EXAM  General: Well developed, well nourished, in no acute distress HEENT:  Normocephalic and atramatic Neck:  No JVD.  Lungs: Clear bilaterally to auscultation and percussion. Heart: HRRR . Normal S1 and S2 without gallops or murmurs.  Abdomen: Bowel sounds are positive, abdomen soft and non-tender  Msk:  Back normal, normal gait. Normal strength and tone for age. Extremities: No clubbing, cyanosis or edema.   Neuro: Alert and oriented X 3. Psych:  Good affect, responds appropriately   LABS: Basic Metabolic Panel: Recent Labs    07/30/23 0201 07/31/23 0536  NA 129* 138  K 3.9 3.9  CL 98 105  CO2 19* 24  GLUCOSE 231* 132*  BUN 43* 30*  CREATININE 2.97* 1.02*  CALCIUM 7.6* 8.1*   Liver Function Tests: Recent Labs    07/29/23 1341 07/30/23 0201  AST 50* 43*  ALT 35 31  ALKPHOS 106 94  BILITOT 0.6 0.5  PROT 6.1* 5.8*  ALBUMIN 3.4* 3.1*   Recent Labs    07/29/23 1341  LIPASE 21   CBC: Recent Labs    07/29/23 1341 07/30/23 0201 07/31/23 0536  WBC 6.7 6.1 9.2  NEUTROABS 5.1  --   --   HGB 12.0 10.8* 10.7*  HCT 36.5 33.1* 31.9*  MCV 95.5 96.8 92.7  PLT 186 162 180   Cardiac Enzymes: No results for input(s): "CKTOTAL", "CKMB", "CKMBINDEX", "TROPONINI" in the last 72 hours. BNP: Invalid input(s): "POCBNP" D-Dimer: No results for input(s): "DDIMER" in the last 72 hours. Hemoglobin A1C: No results for input(s): "HGBA1C" in the last 72  hours. Fasting Lipid Panel: No results for input(s): "CHOL", "HDL", "LDLCALC", "TRIG", "CHOLHDL", "LDLDIRECT" in the last 72 hours. Thyroid Function Tests: Recent Labs    07/29/23 1631  TSH 0.058*   Anemia Panel: No results for input(s): "VITAMINB12", "FOLATE", "FERRITIN", "TIBC", "IRON", "RETICCTPCT" in the last 72 hours.  DG Chest Port 1 View Result Date: 08/01/2023 CLINICAL DATA:  [id number] Acute respiratory failure with hypoxia [facility]) [id number] EXAM: PORTABLE CHEST - 1 VIEW COMPARISON:  07/29/2023 FINDINGS: Scattered coarse interstitial and airspace infiltrates in both lungs, increased from previous. Heart size and mediastinal contours are within normal limits. Aortic Atherosclerosis (ICD10-170.0). No effusion. Visualized bones unremarkable. IMPRESSION: Increasing bilateral scattered pulmonary opacities. Electronically Signed   By: Corlis Leak M.D.   On: 08/01/2023 08:58   ECHOCARDIOGRAM COMPLETE Result Date: 07/31/2023    ECHOCARDIOGRAM REPORT   Patient Name:   Stephanie Small Date of Exam: 07/31/2023 Medical Rec #:  [id number]         Height:       64.0 in Accession #:    [id number]        Weight:       215.0 lb Date of Birth:  01-Dec-1945  BSA:          2.017 m Patient Age:    77 years          BP:           111/68 mmHg Patient Gender: F                 HR:           72 bpm. Exam Location:  [facility] Procedure: 2D Echo, Cardiac Doppler and Color Doppler Indications:     Elevated Troponin  History:         Patient has prior history of Echocardiogram examinations, most                  recent 09/21/2020. COPD; Risk Factors:Hypertension.  Sonographer:     Cristela Blue Referring Phys:  [id number] CARALYN HUDSON Diagnosing Phys: Alwyn Pea MD IMPRESSIONS  1. Left ventricular ejection fraction, by estimation, is 60 to 65%. The left ventricle has normal function. The left ventricle has no regional wall motion abnormalities. There is moderate concentric left ventricular hypertrophy. Left ventricular  diastolic parameters are consistent with Grade II diastolic dysfunction (pseudonormalization).  2. Right ventricular systolic function is normal. The right ventricular size is normal.  3. The mitral valve is normal in structure. Mild to moderate mitral valve regurgitation.  4. The aortic valve is normal in structure. Aortic valve regurgitation is not visualized. FINDINGS  Left Ventricle: Left ventricular ejection fraction, by estimation, is 60 to 65%. The left ventricle has normal function. The left ventricle has no regional wall motion abnormalities. The left ventricular internal cavity size was normal in size. There is  moderate concentric left ventricular hypertrophy. Left ventricular diastolic parameters are consistent with Grade II diastolic dysfunction (pseudonormalization). Right Ventricle: The right ventricular size is normal. No increase in right ventricular wall thickness. Right ventricular systolic function is normal. Left Atrium: Left atrial size was normal in size. Right Atrium: Right atrial size was normal in size. Pericardium: There is no evidence of pericardial effusion. Mitral Valve: The mitral valve is normal in structure. Mild to moderate mitral valve regurgitation. Tricuspid Valve: The tricuspid valve is normal in structure. Tricuspid valve regurgitation is mild. Aortic Valve: The aortic valve is normal in structure. Aortic valve regurgitation is not visualized. Aortic valve mean gradient measures 6.0 mmHg. Aortic valve peak gradient measures 11.0 mmHg. Aortic valve area, by VTI measures 1.84 cm. Pulmonic Valve: The pulmonic valve was normal in structure. Pulmonic valve regurgitation is not visualized. Aorta: The ascending aorta was not well visualized. IAS/Shunts: No atrial level shunt detected by color flow Doppler.  LEFT VENTRICLE PLAX 2D LVIDd:         4.90 cm   Diastology LVIDs:         3.30 cm   LV e' medial:    10.60 cm/s LV PW:         1.30 cm   LV E/e' medial:  10.4 LV IVS:        1.40  cm   LV e' lateral:   11.60 cm/s LVOT diam:     2.20 cm   LV E/e' lateral: 9.5 LV SV:         62 LV SV Index:   31 LVOT Area:     3.80 cm  RIGHT VENTRICLE RV Basal diam:  2.90 cm RV Mid diam:    2.70 cm LEFT ATRIUM           Index  RIGHT ATRIUM           Index LA diam:      4.30 cm 2.13 cm/m   RA Area:     15.60 cm LA Vol (A2C): 42.1 ml 20.87 ml/m  RA Volume:   39.00 ml  19.33 ml/m LA Vol (A4C): 85.1 ml 42.18 ml/m  AORTIC VALVE AV Area (Vmax):    2.07 cm AV Area (Vmean):   1.89 cm AV Area (VTI):     1.84 cm AV Vmax:           166.00 cm/s AV Vmean:          117.000 cm/s AV VTI:            0.336 m AV Peak Grad:      11.0 mmHg AV Mean Grad:      6.0 mmHg LVOT Vmax:         90.20 cm/s LVOT Vmean:        58.100 cm/s LVOT VTI:          0.163 m LVOT/AV VTI ratio: 0.49  AORTA Ao Root diam: 3.80 cm MITRAL VALVE                TRICUSPID VALVE MV Area (PHT): 3.31 cm     TR Peak grad:   17.6 mmHg MV Decel Time: 229 msec     TR Vmax:        210.00 cm/s MV E velocity: 110.00 cm/s MV A velocity: 67.10 cm/s   SHUNTS MV E/A ratio:  1.64         Systemic VTI:  0.16 m                             Systemic Diam: 2.20 cm Alwyn Pea MD Electronically signed by Alwyn Pea MD Signature Date/Time: 07/31/2023/2:16:51 PM    Final      Echo LVEF 60-65%, mild to moderate mitral regurgitation, 07/31/2023  TELEMETRY: Atrial fibrillation 103 bpm:  ASSESSMENT AND PLAN:  Principal Problem:   Acute respiratory failure with hypoxia and hypercapnia (HCC) Active Problems:   COPD exacerbation (HCC)   Essential hypertension   Type 2 diabetes mellitus with stage 3 chronic kidney disease (HCC)   Atrial fibrillation with rapid ventricular response (HCC)   Chronic pain syndrome   Hypotension   Influenza A   AKI (acute kidney injury) (HCC)   Hypothyroidism   Myocardial injury   Hyponatremia    1.  Elevated troponin, (357, 478, 359) probable type II MI with demand supply ischemia, in the absence of chest  pain, in the setting of COPD exacerbation and influenza A pneumonia, also concomitant with acute kidney injury (BUN 40, creatinine 3.02, respectively on 07/29/2023) 2.  New onset atrial fibrillation with rapid ventricular rate, in the setting of COPD exacerbation and influenza A pneumonia, on heparin drip, and diltiazem drip, rate improved 3.  Acute kidney injury, improved since admission (BUN and creatinine 30 and 1.02, respectively) 4.  COPD exacerbation, improving, on multiple inhalers 5.  Influenza A, on Tamiflu  Recommendations  1.  Agree with current therapy 2.  Transition IV heparin to Eliquis 5 mg twice daily 3.  Transition diltiazem drip to diltiazem 60 mg p.o. every 8 hours 4.  Defer cardiac catheterization     Marcina Millard, MD, PhD, [facility] 08/01/2023 9:09 AM

## 2023-08-01 NOTE — TOC Progression Note (Signed)
Transition of Care [facility]) - Progression Note    Patient Details  Name: Stephanie Small MRN: [id number] Date of Birth: October 08, 1945  Transition of Care [facility]) CM/SW Contact  Hetty Ely, RN Phone Number: 08/01/2023, 4:01 PM  Clinical Narrative: Per MD, not medically stable due to need for hi-flo nasal canula.          Expected Discharge Plan and Services                                               Social Determinants of Health (SDOH) Interventions SDOH Screenings   Food Insecurity: No Food Insecurity (07/31/2023)  Housing: Low Risk  (07/31/2023)  Transportation Needs: No Transportation Needs (07/31/2023)  Utilities: At Risk (07/31/2023)  Social Connections: Moderately Isolated (07/31/2023)  Tobacco Use: High Risk (05/19/2023)   Received from [facility]    Readmission Risk Interventions    07/30/2023    4:47 PM  Readmission Risk Prevention Plan  Transportation Screening Complete  PCP or Specialist Appt within 3-5 Days Complete  HRI or Home Care Consult Complete  Social Work Consult for Recovery Care Planning/Counseling Complete  Palliative Care Screening Not Applicable  Medication Review [employer]) Complete

## 2023-08-02 DIAGNOSIS — J101 Influenza due to other identified influenza virus with other respiratory manifestations: Secondary | ICD-10-CM | POA: Diagnosis not present

## 2023-08-02 DIAGNOSIS — I4891 Unspecified atrial fibrillation: Secondary | ICD-10-CM | POA: Diagnosis not present

## 2023-08-02 DIAGNOSIS — R1084 Generalized abdominal pain: Secondary | ICD-10-CM | POA: Diagnosis present

## 2023-08-02 DIAGNOSIS — J441 Chronic obstructive pulmonary disease with (acute) exacerbation: Secondary | ICD-10-CM | POA: Diagnosis not present

## 2023-08-02 DIAGNOSIS — F419 Anxiety disorder, unspecified: Secondary | ICD-10-CM | POA: Diagnosis present

## 2023-08-02 DIAGNOSIS — E875 Hyperkalemia: Secondary | ICD-10-CM | POA: Diagnosis present

## 2023-08-02 DIAGNOSIS — J9601 Acute respiratory failure with hypoxia: Secondary | ICD-10-CM | POA: Diagnosis not present

## 2023-08-02 LAB — GLUCOSE, CAPILLARY
Glucose-Capillary: 165 mg/dL — ABNORMAL HIGH (ref 70–99)
Glucose-Capillary: 151 mg/dL — ABNORMAL HIGH (ref 70–99)
Glucose-Capillary: 201 mg/dL — ABNORMAL HIGH (ref 70–99)
Glucose-Capillary: 239 mg/dL — ABNORMAL HIGH (ref 70–99)
Glucose-Capillary: 208 mg/dL — ABNORMAL HIGH (ref 70–99)

## 2023-08-02 LAB — CBC
HCT: 35.2 % — ABNORMAL LOW (ref 36.0–46.0)
Hemoglobin: 11.7 g/dL — ABNORMAL LOW (ref 12.0–15.0)
MCH: 31.5 pg (ref 26.0–34.0)
MCHC: 33.2 g/dL (ref 30.0–36.0)
MCV: 94.9 fL (ref 80.0–100.0)
Platelets: 206 10*3/uL (ref 150–400)
RBC: 3.71 MIL/uL — ABNORMAL LOW (ref 3.87–5.11)
RDW: 13.5 % (ref 11.5–15.5)
WBC: 6.1 10*3/uL (ref 4.0–10.5)
nRBC: 0 % (ref 0.0–0.2)

## 2023-08-02 LAB — BASIC METABOLIC PANEL
Anion gap: 7 (ref 5–15)
BUN: 18 mg/dL (ref 8–23)
CO2: 27 mmol/L (ref 22–32)
Calcium: 9.3 mg/dL (ref 8.9–10.3)
Chloride: 104 mmol/L (ref 98–111)
Creatinine, Ser: 0.75 mg/dL (ref 0.44–1.00)
GFR, Estimated: >60 mL/min (ref >60)
Glucose, Bld: 192 mg/dL — ABNORMAL HIGH (ref 70–99)
Potassium: 5.4 mmol/L — ABNORMAL HIGH (ref 3.5–5.1)
Sodium: 138 mmol/L (ref 135–145)

## 2023-08-02 LAB — PROCALCITONIN: Procalcitonin: <0.1 ng/mL

## 2023-08-02 MED ORDER — DILTIAZEM HCL 25 MG/5ML IV SOLN
10.0000 mg | Freq: Once | INTRAVENOUS | Status: DC
  Filled 2023-08-02: qty 5

## 2023-08-02 MED ORDER — SODIUM ZIRCONIUM CYCLOSILICATE 10 G PO PACK
10.0000 g | PACK | Freq: Once | ORAL | Status: AC
  Administered 2023-08-02: 10 g via ORAL
  Filled 2023-08-02: qty 1

## 2023-08-02 MED ORDER — AMIODARONE HCL IN DEXTROSE 360-4.14 MG/200ML-% IV SOLN
30.0000 mg/h | INTRAVENOUS | Status: DC
  Administered 2023-08-03 – 2023-08-04 (×3): 30 mg/h via INTRAVENOUS
  Filled 2023-08-02 (×3): qty 200

## 2023-08-02 MED ORDER — SALINE SPRAY 0.65 % NA SOLN
1.0000 | NASAL | Status: DC | PRN
  Administered 2023-08-02 – 2023-08-03 (×3): 1 via NASAL
  Filled 2023-08-02 (×2): qty 44

## 2023-08-02 MED ORDER — AMIODARONE LOAD VIA INFUSION
150.0000 mg | Freq: Once | INTRAVENOUS | Status: AC
  Administered 2023-08-02: 150 mg via INTRAVENOUS
  Filled 2023-08-02: qty 83.34

## 2023-08-02 MED ORDER — PANTOPRAZOLE SODIUM 40 MG IV SOLR
40.0000 mg | Freq: Two times a day (BID) | INTRAVENOUS | Status: DC
Start: 2023-08-02 — End: 2023-08-04
  Administered 2023-08-02 – 2023-08-04 (×5): 40 mg via INTRAVENOUS
  Filled 2023-08-02 (×5): qty 10

## 2023-08-02 MED ORDER — GABAPENTIN 400 MG PO CAPS: 400.0000 mg | ORAL_CAPSULE | Freq: Two times a day (BID) | ORAL | Status: DC

## 2023-08-02 MED ORDER — IPRATROPIUM-ALBUTEROL 0.5-2.5 (3) MG/3ML IN SOLN
3.0000 mL | Freq: Three times a day (TID) | RESPIRATORY_TRACT | Status: DC
  Administered 2023-08-02 – 2023-08-03 (×3): 3 mL via RESPIRATORY_TRACT
  Filled 2023-08-02 (×3): qty 3

## 2023-08-02 MED ORDER — LORAZEPAM 2 MG/ML IJ SOLN
0.5000 mg | Freq: Once | INTRAMUSCULAR | Status: AC
  Administered 2023-08-02: 0.5 mg via INTRAVENOUS
  Filled 2023-08-02: qty 1

## 2023-08-02 MED ORDER — GABAPENTIN 400 MG PO CAPS
400.0000 mg | ORAL_CAPSULE | Freq: Three times a day (TID) | ORAL | Status: DC
  Administered 2023-08-02 – 2023-08-06 (×12): 400 mg via ORAL
  Filled 2023-08-02 (×12): qty 1

## 2023-08-02 MED ORDER — DILTIAZEM HCL 25 MG/5ML IV SOLN
10.0000 mg | Freq: Once | INTRAVENOUS | Status: AC
  Administered 2023-08-02: 10 mg via INTRAVENOUS
  Filled 2023-08-02: qty 5

## 2023-08-02 MED ORDER — AMIODARONE HCL IN DEXTROSE 360-4.14 MG/200ML-% IV SOLN
60.0000 mg/h | INTRAVENOUS | Status: AC
  Administered 2023-08-02 (×2): 60 mg/h via INTRAVENOUS
  Filled 2023-08-02 (×2): qty 200

## 2023-08-02 NOTE — Progress Notes (Signed)
PT Cancellation Note  Patient Details Name: Stephanie Small MRN: [id number] DOB: 1945-07-28   Cancelled Treatment:    Reason Eval/Treat Not Completed: Pain limiting ability to participate (chart reviewed, treatment attempted. Pt appears anxious on arrival, has had significant and concerning new ABD pain x15 minutes. Pt reports RN has not come to room yet. Author relays message to unit sec.)   Aldona Bryner C 08/02/2023, 2:47 PM

## 2023-08-02 NOTE — Assessment & Plan Note (Signed)
1 dose of Lokelma on 2/2.

## 2023-08-02 NOTE — Progress Notes (Signed)
Patient ID: Stephanie Small, female   DOB: Jan 10, 1946, 78 y.o.   MRN: [id number] [facility] Cardiology    SUBJECTIVE: Patient resting comfortably feels like patient currently receiving a breathing treatment with albuterol so she is tachycardic   Vitals:   08/02/23 0542 08/02/23 0755 08/02/23 0919 08/02/23 1149  BP: 125/76  (!) 159/83 (!) 169/110  Pulse:   70 79  Resp:   20 20  Temp:   97.7 F (36.5 C) 97.7 F (36.5 C)  TempSrc:      SpO2:  93% 94% 95%  Weight:      Height:         Intake/Output Summary (Last 24 hours) at 08/02/2023 1230 Last data filed at 08/01/2023 2149 Gross per 24 hour  Intake 483 ml  Output --  Net 483 ml      PHYSICAL EXAM  General: Well developed, well nourished, in no acute distress HEENT:  Normocephalic and atramatic Neck:  No JVD.  Lungs: Clear bilaterally to auscultation and percussion. Heart: Tachycardic. Normal S1 and S2 without gallops or murmurs.  Abdomen: Bowel sounds are positive, abdomen soft and non-tender  Msk:  Back normal, normal gait. Normal strength and tone for age. Extremities: No clubbing, cyanosis or edema.   Neuro: Alert and oriented X 3. Psych:  Good affect, responds appropriately   LABS: Basic Metabolic Panel: Recent Labs    07/31/23 0536 08/02/23 0416  NA 138 138  K 3.9 5.4*  CL 105 104  CO2 24 27  GLUCOSE 132* 192*  BUN 30* 18  CREATININE 1.02* 0.75  CALCIUM 8.1* 9.3   Liver Function Tests: No results for input(s): "AST", "ALT", "ALKPHOS", "BILITOT", "PROT", "ALBUMIN" in the last 72 hours. No results for input(s): "LIPASE", "AMYLASE" in the last 72 hours. CBC: Recent Labs    07/31/23 0536 08/02/23 0416  WBC 9.2 6.1  HGB 10.7* 11.7*  HCT 31.9* 35.2*  MCV 92.7 94.9  PLT 180 206   Cardiac Enzymes: No results for input(s): "CKTOTAL", "CKMB", "CKMBINDEX", "TROPONINI" in the last 72 hours. BNP: Invalid input(s): "POCBNP" D-Dimer: No results for input(s): "DDIMER" in the last 72 hours. Hemoglobin A1C: No  results for input(s): "HGBA1C" in the last 72 hours. Fasting Lipid Panel: No results for input(s): "CHOL", "HDL", "LDLCALC", "TRIG", "CHOLHDL", "LDLDIRECT" in the last 72 hours. Thyroid Function Tests: No results for input(s): "TSH", "T4TOTAL", "T3FREE", "THYROIDAB" in the last 72 hours.  Invalid input(s): "FREET3" Anemia Panel: No results for input(s): "VITAMINB12", "FOLATE", "FERRITIN", "TIBC", "IRON", "RETICCTPCT" in the last 72 hours.  DG Chest Port 1 View Result Date: 08/01/2023 CLINICAL DATA:  [id number] Acute respiratory failure with hypoxia [facility]) [id number] EXAM: PORTABLE CHEST - 1 VIEW COMPARISON:  07/29/2023 FINDINGS: Scattered coarse interstitial and airspace infiltrates in both lungs, increased from previous. Heart size and mediastinal contours are within normal limits. Aortic Atherosclerosis (ICD10-170.0). No effusion. Visualized bones unremarkable. IMPRESSION: Increasing bilateral scattered pulmonary opacities. Electronically Signed   By: Corlis Leak M.D.   On: 08/01/2023 08:58     Echo of left ventricular function EF around 60%  TELEMETRY: Normal sinus rhythm nonspecific ST-T changes rate of about 90:  ASSESSMENT AND PLAN:  Principal Problem:   Acute respiratory failure with hypoxia and hypercapnia (HCC) Active Problems:   COPD exacerbation (HCC)   Essential hypertension   Type 2 diabetes mellitus with stage 3 chronic kidney disease (HCC)   Atrial fibrillation with rapid ventricular response (HCC)   Chronic pain syndrome   Hypotension   Influenza  A   Acute kidney injury superimposed on CKD (HCC)   Hypothyroidism   Myocardial injury   Hyponatremia    Plan Acute respiratory hypoxic failure with hypercapnia continue supplemental oxygen continue inhalers continue treatment for influenza A COPD exacerbation secondary to influenza A continue steroid therapy inhalers adequate hydration Atrial fibrillation rapid ventricular response continue Eliquis for anticoagulation Cardizem  for rate control consider increasing dose because of her tachycardia Elevated troponin probably related to demand ischemia do not recommend any invasive procedures echocardiogram showed preserved left ventricular function with no wall motion abnormalities Diabetes type 2 continue diabetes management on Semglee insulin Hypertension reasonably controlled continue Cardizem therapy   Alwyn Pea, MD, 08/02/2023 12:30 PM

## 2023-08-02 NOTE — Progress Notes (Signed)
Progress Note   Patient: Stephanie Small ZOX:[id number] DOB: 1946/06/03 DOA: 07/29/2023     4 DOS: the patient was seen and examined on 08/02/2023   Brief hospital course: 78 year old female past medical history of COPD, chronic pain, hypothyroidism, type 2 diabetes mellitus, hypertension, hyperlipidemia presented to the ED with shortness of breath.  She has been feeling unwell for the last week.  Her household is also all sick.  Developed shortness of breath within 24 hours of coming in and quickly worsened.  She is found to have a pulse ox of 89% on 12 L of high flow nasal cannula.  Found to be positive for influenza A.  Also had an elevated troponin but denies chest pain.  1/30.  Patient was on high flow nasal cannula 12 L and decrease down to 10 L.  Feels better than when she came in but still not feeling great. 1/31.  Creatinine improved to 1.02.  Patient still having shortness of breath.  Daughter concerned that Duragesic patch was held initially.  The order was in the computer as of yesterday.  Patient went into rapid atrial fibrillation in the late afternoon requiring to be put on Cardizem drip. 2/1.  Will switch heparin drip back to Eliquis.  Will try to taper off Cardizem drip by starting oral Cardizem since heart rate better controlled. 2/2.  High flow nasal cannula down to 7 L.  Patient feeling a little bit better.  Assessment and Plan: * Acute respiratory failure with hypoxia and hypercapnia (HCC) Severe hypoxia with oxygen saturation as low as 70% and mild hypercapnia with pCO2 48 in the setting of COPD exacerbation, influenza A, and dehydration.    Patient still on high flow nasal cannula 7 L as of this morning.  COPD exacerbation (HCC) Secondary to influenza A. Continue 40 mg of Solu-Medrol daily. Continue DuoNeb   Influenza A With pneumonia, Influenza A PCR positive. Completed Tamiflu Sepsis ruled out did not meet all of the SIRS criteria.  Atrial fibrillation with  rapid ventricular response (HCC) Continue Eliquis.  Cardizem drip switched over to oral Cardizem on 2/1.  Myocardial injury Secondary to demand ischemia with acute respiratory failure.  Cardiology does not recommend any intervention.  Echocardiogram showed an EF of 60 to 65%.  Hypotension Improved  Acute kidney injury superimposed on CKD (HCC) On chronic kidney disease stage IIIa.  Creatinine peaked at 3.02.  Last creatinine down to 0.75.  Hyperkalemia 1 dose of Lokelma on 2/2.  Hyponatremia Resolved.  Likely secondary to dehydration  Hypothyroidism Continue Synthroid at lower dose with TSH on the lower side.  Chronic pain syndrome Continue home fentanyl patch, as needed oxycodone.   Type 2 diabetes mellitus with stage 3 chronic kidney disease (HCC) Hemoglobin A1c 7.3.  On low-dose Semglee insulin and sliding scale.  Essential hypertension Continue oral Cardizem        Subjective: Patient feeling a little bit better with regards to her breathing.  Down to 7 L high flow nasal cannula.  Admitted with influenza pneumonia.  Physical Exam: Vitals:   08/02/23 0755 08/02/23 0919 08/02/23 1149 08/02/23 1343  BP:  (!) 159/83 (!) 169/110   Pulse:  70 79   Resp:  20 20   Temp:  97.7 F (36.5 C) 97.7 F (36.5 C)   TempSrc:      SpO2: 93% 94% 95% 94%  Weight:      Height:       Physical Exam HENT:     Head:  Normocephalic.     Mouth/Throat:     Pharynx: No oropharyngeal exudate.  Eyes:     General: Lids are normal.     Conjunctiva/sclera: Conjunctivae normal.  Cardiovascular:     Rate and Rhythm: Normal rate. Rhythm irregularly irregular.     Heart sounds: Normal heart sounds, S1 normal and S2 normal.  Pulmonary:     Breath sounds: Examination of the right-lower field reveals decreased breath sounds. Examination of the left-lower field reveals decreased breath sounds. Decreased breath sounds present. No rhonchi or rales.  Abdominal:     Palpations: Abdomen is soft.      Tenderness: There is no abdominal tenderness.  Musculoskeletal:     Right lower leg: Swelling present.     Left lower leg: Swelling present.  Skin:    General: Skin is warm.     Findings: No rash.  Neurological:     Mental Status: She is alert and oriented to person, place, and time.     Data Reviewed: Potassium 5.4, creatinine 0.75, procalcitonin negative, white blood cell count 6.1, hemoglobin 11.7, platelet count 206  Family Communication: Daughter at bedside  Disposition: Status is: Inpatient Remains inpatient appropriate because: Still on high flow nasal cannula.  Planned Discharge Destination: Home    Time spent: 28 minutes Case discussed with nursing staff.  Author: Alford Highland, MD 08/02/2023 1:52 PM  For on call review [email or web address].

## 2023-08-02 NOTE — Progress Notes (Signed)
Called with abdominal pain all over, had an BM but no improvement with pain.  Feels jittery.  BP and hr up/  Physical Exam Cardiovascular:     Rate and Rhythm: Tachycardia present. Rhythm irregularly irregular.     Heart sounds: Normal heart sounds, S1 normal and S2 normal.  Pulmonary:     Breath sounds: Examination of the right-lower field reveals decreased breath sounds. Examination of the left-lower field reveals decreased breath sounds. Decreased breath sounds present. No wheezing, rhonchi or rales.  Abdominal:     Palpations: Abdomen is soft.     Tenderness: There is generalized abdominal tenderness.     Plan:  IV cardiazem for rapid afib, continue oral cardizem Watch abdominal pain for now, did not want to do any imaging.  Will give iv protonix Anxiety- will give a dose of iv ativan and restart gabapentin  Dr Renae Gloss 20 minutes

## 2023-08-03 DIAGNOSIS — J101 Influenza due to other identified influenza virus with other respiratory manifestations: Secondary | ICD-10-CM | POA: Diagnosis not present

## 2023-08-03 DIAGNOSIS — J441 Chronic obstructive pulmonary disease with (acute) exacerbation: Secondary | ICD-10-CM | POA: Diagnosis not present

## 2023-08-03 DIAGNOSIS — I4891 Unspecified atrial fibrillation: Secondary | ICD-10-CM | POA: Diagnosis not present

## 2023-08-03 DIAGNOSIS — J9601 Acute respiratory failure with hypoxia: Secondary | ICD-10-CM | POA: Diagnosis not present

## 2023-08-03 LAB — CULTURE, BLOOD (ROUTINE X 2)
Culture: NO GROWTH
Culture: NO GROWTH

## 2023-08-03 LAB — GLUCOSE, CAPILLARY
Glucose-Capillary: 192 mg/dL — ABNORMAL HIGH (ref 70–99)
Glucose-Capillary: 287 mg/dL — ABNORMAL HIGH (ref 70–99)
Glucose-Capillary: 94 mg/dL (ref 70–99)
Glucose-Capillary: 231 mg/dL — ABNORMAL HIGH (ref 70–99)
Glucose-Capillary: 229 mg/dL — ABNORMAL HIGH (ref 70–99)

## 2023-08-03 MED ORDER — ORAL CARE MOUTH RINSE: 15.0000 mL | OROMUCOSAL | Status: DC | PRN

## 2023-08-03 MED ORDER — IPRATROPIUM-ALBUTEROL 20-100 MCG/ACT IN AERS
1.0000 | INHALATION_SPRAY | Freq: Three times a day (TID) | RESPIRATORY_TRACT | Status: DC
  Administered 2023-08-03 – 2023-08-06 (×10): 1 via RESPIRATORY_TRACT
  Filled 2023-08-03: qty 4

## 2023-08-03 MED ORDER — DILTIAZEM HCL ER COATED BEADS 120 MG PO CP24
240.0000 mg | ORAL_CAPSULE | Freq: Every day | ORAL | Status: DC
  Administered 2023-08-03 – 2023-08-06 (×4): 240 mg via ORAL
  Filled 2023-08-03 (×5): qty 2

## 2023-08-03 MED ORDER — SODIUM CHLORIDE 0.9 % IN NEBU
3.0000 mL | INHALATION_SOLUTION | Freq: Three times a day (TID) | RESPIRATORY_TRACT | Status: DC | PRN
  Administered 2023-08-03: 3 mL via RESPIRATORY_TRACT

## 2023-08-03 NOTE — Progress Notes (Signed)
Occupational Therapy Treatment Patient Details Name: JONAYA FRESHOUR MRN: [id number] DOB: 07/27/1945 Today's Date: 08/03/2023   History of present illness 78 y.o. female with medical history significant of COPD, chronic pain syndrome, hypothyroidism, type 2 diabetes, hypertension, hyperlipidemia, who presents to the ED due to shortness of breath.  Pt found to be + for flu. Pt recently started on amio drip on 08/02/23 along with elevated K+. Per discussion with MD, safe to work with therapy this date.   OT comments  Pt is seated in recliner on arrival. Pleasant and agreeable to OT session. She denies pain. Pt performed STS from recliner with SUP and no AD use. She held to bed rail to ambulate around the bed to prepare for bathing/dressing tasks. SP02 on 4.5L throughout session remained above 90%. HR ranging from 111-150 during session. Edu on energy conservation and pacing provided to pt and son. Pt sat to perform UB ADLs with set up assist and SUP in standing for bathing of peri area. No LOB, but pt feeling weak and tired after ADL session. She returned to supine with SUP and was set up to eat her lucnh. Pt left with all needs in place and will cont to require skilled acute OT services to maximize her safety and IND to return to PLOF.       If plan is discharge home, recommend the following:  A little help with walking and/or transfers;A little help with bathing/dressing/bathroom   Equipment Recommendations  BSC/3in1;Tub/shower seat (for energy conservation purposes)    Recommendations for Other Services      Precautions / Restrictions Precautions Precautions: Fall Precaution Comments: watch HR and sp02 Restrictions Weight Bearing Restrictions Per Provider Order: No       Mobility Bed Mobility Overal bed mobility: Needs Assistance Bed Mobility: Sit to Supine       Sit to supine: Supervision        Transfers Overall transfer level: Needs assistance Equipment used:  None Transfers: Sit to/from Stand Sit to Stand: Supervision           General transfer comment: SUP for STS from recliner and held bed rail to walk around the bed to prepare for bathing     Balance Overall balance assessment: Modified Independent                                         ADL either performed or assessed with clinical judgement   ADL Overall ADL's : Needs assistance/impaired     Grooming: Wash/dry face;Wash/dry hands;Sitting;Set up   Upper Body Bathing: Set up;Sitting   Lower Body Bathing: Supervison/ safety;Sit to/from stand   Upper Body Dressing : Minimal assistance;Sitting Upper Body Dressing Details (indicate cue type and reason): to manage gown around IV and tele                   General ADL Comments: SUP to SET up assist for ADLs with cueing for compensatory strategies, pacing, PLB, etc.    Extremity/Trunk Assessment              Vision       Perception     Praxis      Cognition Arousal: Alert Behavior During Therapy: WFL for tasks assessed/performed Overall Cognitive Status: Within Functional Limits for tasks assessed  General Comments: pleasant and agreeable to session        Exercises Other Exercises Other Exercises: Edu on ECS, pacing, PLB, etc. during ADL performance with son in room.    Shoulder Instructions       General Comments 4.5L HFNC with 93% sp02 readings; HR up to 150 with activity and ranging from 111-126 at rest    Pertinent Vitals/ Pain       Pain Assessment Pain Assessment: No/denies pain  Home Living                                          Prior Functioning/Environment              Frequency  Min 1X/week        Progress Toward Goals  OT Goals(current goals can now be found in the care plan section)  Progress towards OT goals: Progressing toward goals  Acute Rehab OT Goals Patient Stated Goal:  improve endurance OT Goal Formulation: With patient Time For Goal Achievement: 08/13/23 Potential to Achieve Goals: Good  Plan      Co-evaluation                 AM-PAC OT "6 Clicks" Daily Activity     Outcome Measure   Help from another person eating meals?: None Help from another person taking care of personal grooming?: None Help from another person toileting, which includes using toliet, bedpan, or urinal?: A Little Help from another person bathing (including washing, rinsing, drying)?: A Little Help from another person to put on and taking off regular upper body clothing?: None Help from another person to put on and taking off regular lower body clothing?: A Little 6 Click Score: 21    End of Session Equipment Utilized During Treatment: Oxygen  OT Visit Diagnosis: Other abnormalities of gait and mobility (R26.89);Muscle weakness (generalized) (M62.81)   Activity Tolerance Patient tolerated treatment well   Patient Left in bed;with call bell/phone within reach;with bed alarm set;with family/visitor present   Nurse Communication Mobility status        Time: 4540-9811 OT Time Calculation (min): 26 min  Charges: OT General Charges $OT Visit: 1 Visit OT Treatments $Self Care/Home Management : 23-37 mins  Gibran Veselka, OTR/L  08/03/23, 12:57 PM   Ronnell Makarewicz E Avyan Livesay 08/03/2023, 12:55 PM

## 2023-08-03 NOTE — Assessment & Plan Note (Signed)
 Resolved

## 2023-08-03 NOTE — Care Management Important Message (Signed)
Important Message  Patient Details  Name: NOU CHARD MRN: [id number] Date of Birth: May 20, 1946   Important Message Given:  Other (see comment)     Sherilyn Banker 08/03/2023, 2:26 PM

## 2023-08-03 NOTE — Progress Notes (Signed)
Patient ID: Stephanie Small, female   DOB: 06-10-1946, 78 y.o.   MRN: [id number] [facility] Cardiology    SUBJECTIVE: Patient resting comfortably sleeping in bed does not appear to be in any respiratory distress no chest pain no weakness no fatigue   Vitals:   08/03/23 0048 08/03/23 0500 08/03/23 0504 08/03/23 0507  BP: (!) 144/88  (!) 133/91 (!) 133/91  Pulse: (!) 101  83   Resp: 18  18   Temp: 98.3 F (36.8 C)  98.3 F (36.8 C)   TempSrc: Oral  Oral   SpO2: 92%  97%   Weight:  103.3 kg    Height:         Intake/Output Summary (Last 24 hours) at 08/03/2023 8295 Last data filed at 08/03/2023 0400 Gross per 24 hour  Intake 264.31 ml  Output 1 ml  Net 263.31 ml      PHYSICAL EXAM  General: Well developed, well nourished, in no acute distress HEENT:  Normocephalic and atramatic Neck:  No JVD.  Lungs: Clear bilaterally to auscultation and percussion. Heart: HRRR . Normal S1 and S2 without gallops or murmurs.  Abdomen: Bowel sounds are positive, abdomen soft and non-tender  Msk:  Back normal, normal gait. Normal strength and tone for age. Extremities: No clubbing, cyanosis or edema.   Neuro: Alert and oriented X 3. Psych:  Good affect, responds appropriately   LABS: Basic Metabolic Panel: Recent Labs    08/02/23 0416  NA 138  K 5.4*  CL 104  CO2 27  GLUCOSE 192*  BUN 18  CREATININE 0.75  CALCIUM 9.3   Liver Function Tests: No results for input(s): "AST", "ALT", "ALKPHOS", "BILITOT", "PROT", "ALBUMIN" in the last 72 hours. No results for input(s): "LIPASE", "AMYLASE" in the last 72 hours. CBC: Recent Labs    08/02/23 0416  WBC 6.1  HGB 11.7*  HCT 35.2*  MCV 94.9  PLT 206   Cardiac Enzymes: No results for input(s): "CKTOTAL", "CKMB", "CKMBINDEX", "TROPONINI" in the last 72 hours. BNP: Invalid input(s): "POCBNP" D-Dimer: No results for input(s): "DDIMER" in the last 72 hours. Hemoglobin A1C: No results for input(s): "HGBA1C" in the last 72 hours. Fasting  Lipid Panel: No results for input(s): "CHOL", "HDL", "LDLCALC", "TRIG", "CHOLHDL", "LDLDIRECT" in the last 72 hours. Thyroid Function Tests: No results for input(s): "TSH", "T4TOTAL", "T3FREE", "THYROIDAB" in the last 72 hours.  Invalid input(s): "FREET3" Anemia Panel: No results for input(s): "VITAMINB12", "FOLATE", "FERRITIN", "TIBC", "IRON", "RETICCTPCT" in the last 72 hours.  No results found.   Echo pending  TELEMETRY: Atrial fibrillation rate controlled around 90 nonspecific ST-T wave changes:  ASSESSMENT AND PLAN:  Principal Problem:   Acute respiratory failure with hypoxia and hypercapnia (HCC) Active Problems:   COPD exacerbation (HCC)   Essential hypertension   Type 2 diabetes mellitus with stage 3 chronic kidney disease (HCC)   Atrial fibrillation with rapid ventricular response (HCC)   Chronic pain syndrome   Hypotension   Influenza A   Acute kidney injury superimposed on CKD (HCC)   Hypothyroidism   Myocardial injury   Hyponatremia   Hyperkalemia   Generalized abdominal pain   Anxiety    Plan Acute respiratory failure hypoxemia hypercapnia continue respiratory support inhalers supplemental oxygen steroid therapy antibiotics treatment for influenza A COPD exacerbation secondary to influenza A acute infection continue Tamiflu which has been completed continue supplemental supportive therapy with inhalers oxygen as necessary Atrial fibrillation rapid ventricular spots continue Eliquis for anticoagulation Cardizem amiodarone Demand ischemia chronic  stable continue conservative therapy Pretension somewhat improved with adequate hydration .  Renal insufficiency improved maintain adequate hydration follow-up with nephrology Abdominal pain generalized unclear etiology resolved Diabetes type 2 uncomplicated agree with current management follow-up with endocrine or primary physician maintain strict diabetes sugar control Attention improved continue oral Cardizem  therapy No invasive cardiac procedures recommended at this stage   Alwyn Pea, MD 08/03/2023 7:18 AM

## 2023-08-03 NOTE — Progress Notes (Signed)
2/3 Patient under Droplet Precaution, I gave IMM Letter to RN assigned to patient to give to patient at bedside.

## 2023-08-03 NOTE — Assessment & Plan Note (Signed)
1 dose of Ativan given yesterday.

## 2023-08-03 NOTE — Progress Notes (Signed)
Physical Therapy Treatment Patient Details Name: Stephanie Small MRN: [id number] DOB: 10-02-1945 Today's Date: 08/03/2023   History of Present Illness 79 y.o. female with medical history significant of COPD, chronic pain syndrome, hypothyroidism, type 2 diabetes, hypertension, hyperlipidemia, who presents to the ED due to shortness of breath.  Pt found to be + for flu. Pt recently started on amio drip on 08/02/23 along with elevated K+. Per discussion with MD, safe to work with therapy this date.    PT Comments  Treatment cleared by MD for participation. Pt on BSC upon arrival with supervision for hygiene and donning new brief. Pt on 5L of O2, increased to 6L for exertion; returned to 5L post exertion. Slight SOB symptoms noted. Discussed importance of short distance mobility for endurance training. Once seated in chair, performed IS consistently reaching in addition to B LE there-ex. Will continue to progress as able.    If plan is discharge home, recommend the following: A little help with walking and/or transfers;A little help with bathing/dressing/bathroom   Can travel by private vehicle        Equipment Recommendations  Rolling walker (2 wheels)    Recommendations for Other Services       Precautions / Restrictions Precautions Precautions: Fall Restrictions Weight Bearing Restrictions Per Provider Order: No     Mobility  Bed Mobility               General bed mobility comments: NT, received on BSC    Transfers Overall transfer level: Needs assistance Equipment used: None Transfers: Sit to/from Stand, Bed to chair/wheelchair/BSC Sit to Stand: Supervision           General transfer comment: safe technique. Pt able to don brief and perform hygiene safely.    Ambulation/Gait Ambulation/Gait assistance: Supervision Gait Distance (Feet): 20 Feet Assistive device: None Gait Pattern/deviations: Step-through pattern       General Gait Details: ambulated  around room, limited by SOB and O2 sats. No LOB noted. O2 sats at 90% on 6L of O2   Stairs             Wheelchair Mobility     Tilt Bed    Modified Rankin (Stroke Patients Only)       Balance Overall balance assessment: Modified Independent                                          Cognition Arousal: Alert Behavior During Therapy: WFL for tasks assessed/performed Overall Cognitive Status: Within Functional Limits for tasks assessed                                 General Comments: pleasant and agreeable to session        Exercises Other Exercises Other Exercises: reviewed HEP while seated in chair. Pt able to perform seated LAQ and alt marching x 10 reps with supervision    General Comments        Pertinent Vitals/Pain Pain Assessment Pain Assessment: No/denies pain    Home Living                          Prior Function            PT Goals (current goals can now be found in the care plan  section) Acute Rehab PT Goals Patient Stated Goal: Get breathing better and go home PT Goal Formulation: With patient Time For Goal Achievement: 08/12/23 Potential to Achieve Goals: Good Progress towards PT goals: Progressing toward goals    Frequency    Min 1X/week      PT Plan      Co-evaluation              AM-PAC PT "6 Clicks" Mobility   Outcome Measure  Help needed turning from your back to your side while in a flat bed without using bedrails?: None Help needed moving from lying on your back to sitting on the side of a flat bed without using bedrails?: None Help needed moving to and from a bed to a chair (including a wheelchair)?: A Little Help needed standing up from a chair using your arms (e.g., wheelchair or bedside chair)?: A Little Help needed to walk in hospital room?: A Little Help needed climbing 3-5 steps with a railing? : A Lot 6 Click Score: 19    End of Session Equipment Utilized  During Treatment: Oxygen Activity Tolerance: Patient tolerated treatment well;Patient limited by fatigue Patient left: in chair;with family/visitor present Nurse Communication: Mobility status PT Visit Diagnosis: Muscle weakness (generalized) (M62.81);Difficulty in walking, not elsewhere classified (R26.2)     Time: 0981-1914 PT Time Calculation (min) (ACUTE ONLY): 13 min  Charges:    $Gait Training: 8-22 mins PT General Charges $$ ACUTE PT VISIT: 1 Visit                     Elizabeth Palau, PT, DPT, GCS [phone]    Zineb Glade 08/03/2023, 10:47 AM

## 2023-08-03 NOTE — Progress Notes (Signed)
Progress Note   Patient: Stephanie Small:[id number] DOB: 31-Oct-1945 DOA: 07/29/2023     5 DOS: the patient was seen and examined on 08/03/2023   Brief hospital course: 78 year old female past medical history of COPD, chronic pain, hypothyroidism, type 2 diabetes mellitus, hypertension, hyperlipidemia presented to the ED with shortness of breath.  She has been feeling unwell for the last week.  Her household is also all sick.  Developed shortness of breath within 24 hours of coming in and quickly worsened.  She is found to have a pulse ox of 89% on 12 L of high flow nasal cannula.  Found to be positive for influenza A.  Also had an elevated troponin but denies chest pain.  1/30.  Patient was on high flow nasal cannula 12 L and decrease down to 10 L.  Feels better than when she came in but still not feeling great. 1/31.  Creatinine improved to 1.02.  Patient still having shortness of breath.  Daughter concerned that Duragesic patch was held initially.  The order was in the computer as of yesterday.  Patient went into rapid atrial fibrillation in the late afternoon requiring to be put on Cardizem drip. 2/1.  Will switch heparin drip back to Eliquis.  Will try to taper off Cardizem drip by starting oral Cardizem since heart rate better controlled. 2/2.  High flow nasal cannula down to 7 L.  Patient feeling a little bit better.  Required amiodarone drip last night secondary to high heart rate. 2/3.  High flow nasal cannula down to 4-1/2 L.  Continue amiodarone drip today.   Assessment and Plan: * Acute respiratory failure with hypoxia and hypercapnia (HCC) Severe hypoxia with oxygen saturation as low as 70% and mild hypercapnia with pCO2 48 in the setting of COPD exacerbation, influenza A, and dehydration.    Patient still on high flow nasal cannula 4.5 L as of this morning.  COPD exacerbation (HCC) Secondary to influenza A. Continue 40 mg of Solu-Medrol daily. Continue DuoNeb   Influenza  A With pneumonia, Influenza A PCR positive. Completed Tamiflu Sepsis ruled out did not meet all of the SIRS criteria.  Atrial fibrillation with rapid ventricular response (HCC) Continue Eliquis.  Cardizem drip switched over to oral Cardizem on 2/1.  Amiodarone drip started on 2/2.  Myocardial injury Secondary to demand ischemia with acute respiratory failure.  Cardiology does not recommend any intervention.  Echocardiogram showed an EF of 60 to 65%.  Hypotension Improved  Acute kidney injury superimposed on CKD (HCC) On chronic kidney disease stage IIIa.  Creatinine peaked at 3.02.  Last creatinine down to 0.75.  Anxiety 1 dose of Ativan given yesterday.  Generalized abdominal pain Resolved  Hyperkalemia 1 dose of Lokelma on 2/2.  Hyponatremia Resolved.  Likely secondary to dehydration  Hypothyroidism Continue Synthroid at lower dose with TSH on the lower side.  Chronic pain syndrome Continue home fentanyl patch, as needed oxycodone.   Type 2 diabetes mellitus with stage 3 chronic kidney disease (HCC) Hemoglobin A1c 7.3.  On low-dose Semglee insulin and sliding scale.  Essential hypertension Continue oral Cardizem        Subjective: Patient states he feels a little bit better this morning was on 4-1/2 L high flow nasal cannula.  Still with some cough after nebulizer.  Physical Exam: Vitals:   08/03/23 0500 08/03/23 0504 08/03/23 0507 08/03/23 0801  BP:  (!) 133/91 (!) 133/91   Pulse:  83    Resp:  18  Temp:  98.3 F (36.8 C)    TempSrc:  Oral    SpO2:  97%  95%  Weight: 103.3 kg     Height:       Physical Exam HENT:     Head: Normocephalic.     Mouth/Throat:     Pharynx: No oropharyngeal exudate.  Eyes:     General: Lids are normal.     Conjunctiva/sclera: Conjunctivae normal.  Cardiovascular:     Rate and Rhythm: Tachycardia present. Rhythm irregularly irregular.     Heart sounds: Normal heart sounds, S1 normal and S2 normal.  Pulmonary:      Breath sounds: Examination of the right-lower field reveals decreased breath sounds. Examination of the left-lower field reveals decreased breath sounds. Decreased breath sounds present. No rhonchi or rales.  Abdominal:     Palpations: Abdomen is soft.     Tenderness: There is no abdominal tenderness.  Musculoskeletal:     Right lower leg: Swelling present.     Left lower leg: Swelling present.  Skin:    General: Skin is warm.     Findings: No rash.  Neurological:     Mental Status: She is alert and oriented to person, place, and time.     Data Reviewed: Procalcitonin negative, hemoglobin 11.7, creatinine 0.75, potassium 5.4  Family Communication: Spoke with family at the bedside in the morning and daughter on the phone this afternoon.  Disposition: Status is: Inpatient Remains inpatient appropriate because: Trying to taper off high flow nasal cannula.  Still requiring IV steroids.  On amiodarone drip  Planned Discharge Destination: Home with home health    Time spent: 28 minutes  Author: Alford Highland, MD 08/03/2023 2:43 PM  For on call review [email or web address].

## 2023-08-04 DIAGNOSIS — J9601 Acute respiratory failure with hypoxia: Secondary | ICD-10-CM | POA: Diagnosis not present

## 2023-08-04 DIAGNOSIS — I4891 Unspecified atrial fibrillation: Secondary | ICD-10-CM | POA: Diagnosis not present

## 2023-08-04 DIAGNOSIS — J101 Influenza due to other identified influenza virus with other respiratory manifestations: Secondary | ICD-10-CM | POA: Diagnosis not present

## 2023-08-04 DIAGNOSIS — J441 Chronic obstructive pulmonary disease with (acute) exacerbation: Secondary | ICD-10-CM | POA: Diagnosis not present

## 2023-08-04 DIAGNOSIS — B370 Candidal stomatitis: Secondary | ICD-10-CM | POA: Diagnosis present

## 2023-08-04 LAB — BASIC METABOLIC PANEL
Anion gap: 10 (ref 5–15)
BUN: 17 mg/dL (ref 8–23)
CO2: 27 mmol/L (ref 22–32)
Calcium: 8.9 mg/dL (ref 8.9–10.3)
Chloride: 97 mmol/L — ABNORMAL LOW (ref 98–111)
Creatinine, Ser: 0.84 mg/dL (ref 0.44–1.00)
GFR, Estimated: >60 mL/min (ref >60)
Glucose, Bld: 155 mg/dL — ABNORMAL HIGH (ref 70–99)
Potassium: 4.8 mmol/L (ref 3.5–5.1)
Sodium: 134 mmol/L — ABNORMAL LOW (ref 135–145)

## 2023-08-04 LAB — CBC
HCT: 35.3 % — ABNORMAL LOW (ref 36.0–46.0)
Hemoglobin: 11.6 g/dL — ABNORMAL LOW (ref 12.0–15.0)
MCH: 30.6 pg (ref 26.0–34.0)
MCHC: 32.9 g/dL (ref 30.0–36.0)
MCV: 93.1 fL (ref 80.0–100.0)
Platelets: 303 10*3/uL (ref 150–400)
RBC: 3.79 MIL/uL — ABNORMAL LOW (ref 3.87–5.11)
RDW: 13.7 % (ref 11.5–15.5)
WBC: 9.9 10*3/uL (ref 4.0–10.5)
nRBC: 0 % (ref 0.0–0.2)

## 2023-08-04 LAB — GLUCOSE, CAPILLARY
Glucose-Capillary: 155 mg/dL — ABNORMAL HIGH (ref 70–99)
Glucose-Capillary: 94 mg/dL (ref 70–99)
Glucose-Capillary: 117 mg/dL — ABNORMAL HIGH (ref 70–99)
Glucose-Capillary: 278 mg/dL — ABNORMAL HIGH (ref 70–99)
Glucose-Capillary: 239 mg/dL — ABNORMAL HIGH (ref 70–99)

## 2023-08-04 MED ORDER — PREDNISONE 20 MG PO TABS
30.0000 mg | ORAL_TABLET | Freq: Every day | ORAL | Status: DC
  Administered 2023-08-04 – 2023-08-06 (×3): 30 mg via ORAL
  Filled 2023-08-04 (×3): qty 1

## 2023-08-04 MED ORDER — NYSTATIN 100000 UNIT/ML MT SUSP
5.0000 mL | Freq: Four times a day (QID) | OROMUCOSAL | Status: DC
  Administered 2023-08-04 – 2023-08-06 (×10): 500000 [IU] via ORAL
  Filled 2023-08-04 (×10): qty 5

## 2023-08-04 MED ORDER — AMIODARONE HCL 200 MG PO TABS
400.0000 mg | ORAL_TABLET | Freq: Every day | ORAL | Status: DC
Start: 2023-08-04 — End: 2023-08-06
  Administered 2023-08-04 – 2023-08-06 (×3): 400 mg via ORAL
  Filled 2023-08-04 (×3): qty 2

## 2023-08-04 MED ORDER — PANTOPRAZOLE SODIUM 40 MG PO TBEC
40.0000 mg | DELAYED_RELEASE_TABLET | Freq: Two times a day (BID) | ORAL | Status: DC
  Administered 2023-08-04 – 2023-08-06 (×4): 40 mg via ORAL
  Filled 2023-08-04 (×4): qty 1

## 2023-08-04 NOTE — Plan of Care (Signed)

## 2023-08-04 NOTE — TOC Progression Note (Signed)
 Transition of Care [facility]) - Progression Note    Patient Details  Name: HA SHANNAHAN MRN: [id number] Date of Birth: [birth year]  [facility] CM/SW Contact  Tomasa JAYSON Childes, RN Phone Number: 08/04/2023, 3:02 PM  Clinical Narrative:    TOC continuing to follow patient's progress throughout discharge planning.        Expected Discharge Plan and Services                                               Social Determinants of Health (SDOH) Interventions SDOH Screenings   Food Insecurity: No Food Insecurity (07/31/2023)  Housing: Low Risk  (07/31/2023)  Transportation Needs: No Transportation Needs (07/31/2023)  Utilities: At Risk (07/31/2023)  Social Connections: Moderately Isolated (07/31/2023)  Tobacco Use: High Risk (05/19/2023)   Received from [facility]    Readmission Risk Interventions    07/30/2023    4:47 PM  Readmission Risk Prevention Plan  Transportation Screening Complete  PCP or Specialist Appt within 3-5 Days Complete  HRI or Home Care Consult Complete  Social Work Consult for Recovery Care Planning/Counseling Complete  Palliative Care Screening Not Applicable  Medication Review [employer]) Complete

## 2023-08-04 NOTE — Assessment & Plan Note (Signed)
Nystatin swish and swallow

## 2023-08-04 NOTE — Progress Notes (Addendum)
 [facility] Cardiology    SUBJECTIVE: Patient still having some dyspnea shortness of breath still has irregular heartbeat denies any chest pain generalized weakness fatigue shortness of breath with wheezing   Vitals:   08/04/23 0800 08/04/23 0840 08/04/23 1226 08/04/23 1649  BP:  (!) 142/105 (!) 156/84 (!) 155/104  Pulse:  (!) 122 63 93  Resp: 18 20    Temp:  98.2 F (36.8 C) (!) 97.5 F (36.4 C) 98.5 F (36.9 C)  TempSrc:   Oral   SpO2:  91% 94% 92%  Weight:      Height:         Intake/Output Summary (Last 24 hours) at 08/04/2023 1852 Last data filed at 08/04/2023 1607 Gross per 24 hour  Intake 796.32 ml  Output --  Net 796.32 ml      PHYSICAL EXAM  General: Well developed, well nourished, in no acute distress HEENT:  Normocephalic and atramatic Neck:  No JVD.  Lungs: Diffuse bilateral rhonchi bilaterally to auscultation and percussion. Heart: HRRR . Normal S1 and S2 without gallops or murmurs.  Abdomen: Bowel sounds are positive, abdomen soft and non-tender  Msk:  Back normal, normal gait. Normal strength and tone for age. Extremities: No clubbing, cyanosis or edema.   Neuro: Alert and oriented X 3. Psych:  Good affect, responds appropriately   LABS: Basic Metabolic Panel: Recent Labs    08/02/23 0416 08/04/23 0715  NA 138 134*  K 5.4* 4.8  CL 104 97*  CO2 27 27  GLUCOSE 192* 155*  BUN 18 17  CREATININE 0.75 0.84  CALCIUM  9.3 8.9   Liver Function Tests: No results for input(s): AST, ALT, ALKPHOS, BILITOT, PROT, ALBUMIN in the last 72 hours. No results for input(s): LIPASE, AMYLASE in the last 72 hours. CBC: Recent Labs    08/02/23 0416 08/04/23 0715  WBC 6.1 9.9  HGB 11.7* 11.6*  HCT 35.2* 35.3*  MCV 94.9 93.1  PLT 206 303   Cardiac Enzymes: No results for input(s): CKTOTAL, CKMB, CKMBINDEX, TROPONINI in the last 72 hours. BNP: Invalid input(s): POCBNP D-Dimer: No results for input(s): DDIMER in the last 72  hours. Hemoglobin A1C: No results for input(s): HGBA1C in the last 72 hours. Fasting Lipid Panel: No results for input(s): CHOL, HDL, LDLCALC, TRIG, CHOLHDL, LDLDIRECT in the last 72 hours. Thyroid  Function Tests: No results for input(s): TSH, T4TOTAL, T3FREE, THYROIDAB in the last 72 hours.  Invalid input(s): FREET3 Anemia Panel: No results for input(s): VITAMINB12, FOLATE, FERRITIN, TIBC, IRON, RETICCTPCT in the last 72 hours.  No results found.   Echo normal left ventricular function EF of 60%  TELEMETRY: Atrial fibrillation rate of 95 nonspecific T2 changes:  ASSESSMENT AND PLAN:  Principal Problem:   Acute respiratory failure with hypoxia and hypercapnia (HCC) Active Problems:   COPD exacerbation (HCC)   Essential hypertension   Type 2 diabetes mellitus with stage 3 chronic kidney disease (HCC)   Atrial fibrillation with rapid ventricular response (HCC)   Chronic pain syndrome   Hypotension   Influenza A   Acute kidney injury superimposed on CKD (HCC)   Hypothyroidism   Myocardial injury   Hyponatremia   Hyperkalemia   Generalized abdominal pain   Anxiety   Thrush    Plan Influenza A acute infection with acute respiratory failure exacerbation on inhalers supplemental oxygen pulmonary input COPD exacerbation secondary to influenza A acute infection continue Tamiflu  which has been completed continue supplemental supportive therapy with inhalers oxygen as necessary Atrial fibrillation rapid  ventricular spots continue Eliquis  for anticoagulation Cardizem  amiodarone  Demand ischemia chronic stable continue conservative therapy Hypotension related to dehydration improved after slight hydration Acute on chronic renal insufficiency maintain adequate hydration adequate hydration follow-up with nephrology Abdominal pain generalized unclear etiology resolved Diabetes type 2 uncomplicated agree with current management follow-up with  endocrine or primary physician maintain strict diabetes sugar control Hypertension improved continue oral Cardizem  therapy Patient denies any chest pain do not recommend cardiac cath or any other invasive procedures at this stage Patient still using nebulizer treatments to help with shortness of breath and respiratory failure   Cara JONETTA Lovelace, MD, 08/04/2023 6:52 PM

## 2023-08-04 NOTE — Progress Notes (Addendum)
 Progress Note   Patient: Stephanie Small FMW:[id number] DOB: Oct 19, 1945 DOA: 07/29/2023     6 DOS: the patient was seen and examined on 08/04/2023   Brief hospital course: 78 year old female past medical history of COPD, chronic pain, hypothyroidism, type 2 diabetes mellitus, hypertension, hyperlipidemia presented to the ED with shortness of breath.  She has been feeling unwell for the last week.  Her household is also all sick.  Developed shortness of breath within 24 hours of coming in and quickly worsened.  She is found to have a pulse ox of 89% on 12 L of high flow nasal cannula.  Found to be positive for influenza A.  Also had an elevated troponin but denies chest pain.  1/30.  Patient was on high flow nasal cannula 12 L and decrease down to 10 L.  Feels better than when she came in but still not feeling great. 1/31.  Creatinine improved to 1.02.  Patient still having shortness of breath.  Daughter concerned that Duragesic  patch was held initially.  The order was in the computer as of yesterday.  Patient went into rapid atrial fibrillation in the late afternoon requiring to be put on Cardizem  drip. 2/1.  Will switch heparin  drip back to Eliquis .  Will try to taper off Cardizem  drip by starting oral Cardizem  since heart rate better controlled. 2/2.  High flow nasal cannula down to 7 L.  Patient feeling a little bit better.  Required amiodarone  drip last night secondary to high heart rate. 2/3.  High flow nasal cannula down to 4-1/2 L.  Continue amiodarone  drip today. 2/4.  Switched over to 4 L regular nasal cannula.  Will convert off amiodarone  drip over to oral amiodarone .   Assessment and Plan: * Acute respiratory failure with hypoxia and hypercapnia (HCC) Severe hypoxia with oxygen saturation as low as 70% and mild hypercapnia with pCO2 48 in the setting of COPD exacerbation, influenza A, and dehydration.    Patient still on high flow nasal cannula 4 L as of this morning but this  afternoon taper down to 4 L regular nasal cannula.  COPD exacerbation (HCC) Secondary to influenza A. Switch Solu-Medrol  over to prednisone  Continue DuoNeb   Influenza A With pneumonia, Influenza A PCR positive. Completed Tamiflu  Sepsis ruled out did not meet all of the SIRS criteria.  Atrial fibrillation with rapid ventricular response (HCC) Continue Eliquis .  Cardizem  drip switched over to oral Cardizem  on 2/1.  Amiodarone  drip started on 2/2 and switched over to oral on 2/4.  Myocardial injury Secondary to demand ischemia with acute respiratory failure.  Cardiology does not recommend any intervention.  Echocardiogram showed an EF of 60 to 65%.  Hypotension Improved  Acute kidney injury superimposed on CKD (HCC) On chronic kidney disease stage IIIa.  Creatinine peaked at 3.02.  Last creatinine down to 0.84.  Thrush Nystatin  swish and swallow.  Anxiety 1 dose of Ativan  given the other day  Generalized abdominal pain Resolved  Hyperkalemia 1 dose of Lokelma  on 2/2.  Hyponatremia Sodium 1 point less than the normal range.  Hypothyroidism Continue Synthroid  at lower dose with TSH on the lower side.  Chronic pain syndrome Continue home fentanyl  patch, as needed oxycodone .   Type 2 diabetes mellitus with stage 3 chronic kidney disease (HCC) Hemoglobin A1c 7.3.  On low-dose Semglee  insulin  and sliding scale.  Essential hypertension Continue oral Cardizem         Subjective: Patient feeling better this morning.  Was on high flow nasal cannula  this morning but looks like switched over to regular nasal cannula this afternoon.  Admitted with influenza and COPD exacerbation.  Physical Exam: Vitals:   08/04/23 0700 08/04/23 0800 08/04/23 0840 08/04/23 1226  BP:   (!) 142/105 (!) 156/84  Pulse:   (!) 122 63  Resp: 20 18 20    Temp:   98.2 F (36.8 C) (!) 97.5 F (36.4 C)  TempSrc:    Oral  SpO2:   91% 94%  Weight:      Height:       Physical Exam HENT:      Head: Normocephalic.     Mouth/Throat:     Comments: Thrush on tongue Eyes:     General: Lids are normal.     Conjunctiva/sclera: Conjunctivae normal.  Cardiovascular:     Rate and Rhythm: Tachycardia present. Rhythm irregularly irregular.     Heart sounds: Normal heart sounds, S1 normal and S2 normal.  Pulmonary:     Breath sounds: Examination of the right-lower field reveals decreased breath sounds. Examination of the left-lower field reveals decreased breath sounds. Decreased breath sounds present. No rhonchi or rales.  Abdominal:     Palpations: Abdomen is soft.     Tenderness: There is no abdominal tenderness.  Musculoskeletal:     Right lower leg: Swelling present.     Left lower leg: Swelling present.  Skin:    General: Skin is warm.     Findings: No rash.  Neurological:     Mental Status: She is alert and oriented to person, place, and time.     Data Reviewed: Sodium 134, creatinine 0.84, white blood cell count 9.9, hemoglobin 11.6, platelet count 3 of 3  Family Communication: Spoke with daughter on the phone and son at the bedside  Disposition: Status is: Inpatient Remains inpatient appropriate because: Trying to see if we can get off oxygen.  Tapering off amiodarone  drip today to oral amiodarone .  Planned Discharge Destination: Home with Home Health    Time spent: 28 minutes  Author: Charlie Patterson, MD 08/04/2023 4:28 PM  For on call review [email or web address].

## 2023-08-05 DIAGNOSIS — J9601 Acute respiratory failure with hypoxia: Secondary | ICD-10-CM | POA: Diagnosis not present

## 2023-08-05 DIAGNOSIS — J9602 Acute respiratory failure with hypercapnia: Secondary | ICD-10-CM | POA: Diagnosis not present

## 2023-08-05 LAB — GLUCOSE, CAPILLARY
Glucose-Capillary: 133 mg/dL — ABNORMAL HIGH (ref 70–99)
Glucose-Capillary: 143 mg/dL — ABNORMAL HIGH (ref 70–99)
Glucose-Capillary: 271 mg/dL — ABNORMAL HIGH (ref 70–99)
Glucose-Capillary: 210 mg/dL — ABNORMAL HIGH (ref 70–99)

## 2023-08-05 NOTE — Plan of Care (Signed)

## 2023-08-05 NOTE — Progress Notes (Signed)
 Occupational Therapy Treatment Patient Details Name: Stephanie Small MRN: [id number] DOB: Apr 01, 1946 Today's Date: 08/05/2023   History of present illness 78 y.o. female with medical history significant of COPD, chronic pain syndrome, hypothyroidism, type 2 diabetes, hypertension, hyperlipidemia, who presents to the ED due to shortness of breath.  Pt found to be + for flu. Pt recently started on amio drip on 08/02/23 along with elevated K+. Per discussion with MD, safe to work with therapy this date.   OT comments  Pt is supine in bed on arrival. Pleasant and agreeable to OT session. She denies pain. Pt performed bed mobility with SUP, STS from EOB to RW with SUP and mobility in the hallway ~100 feet x2 trials with SBA and 1 standing rest break during each to monitor HR and sp02 and 1 seated rest break between trials. 4L on first trial with sp02 around 94%, placed on 3L for second trial with lowest reading of 90%. HR up to 137 on tele. Pt discussing using a life alert with OT and edu on benefits of this for safety purpose at home. Pt returned to bed with all needs in place and will cont to require skilled acute OT services to maximize her safety and IND to return to PLOF.       If plan is discharge home, recommend the following:  A little help with walking and/or transfers;A little help with bathing/dressing/bathroom;Assistance with cooking/housework;Help with stairs or ramp for entrance   Equipment Recommendations  BSC/3in1;Tub/shower seat    Recommendations for Other Services      Precautions / Restrictions Precautions Precautions: Fall Precaution Comments: watch HR and sp02 Restrictions Weight Bearing Restrictions Per Provider Order: No       Mobility Bed Mobility Overal bed mobility: Needs Assistance Bed Mobility: Supine to Sit     Supine to sit: Supervision Sit to supine: Supervision        Transfers Overall transfer level: Needs assistance Equipment used: Rolling  walker (2 wheels) Transfers: Sit to/from Stand Sit to Stand: Supervision           General transfer comment: SUP for STS to RW and SBA for mobility to NS and back x2 on 4L then weaned to 3L for 2nd walk     Balance Overall balance assessment: Needs assistance Sitting-balance support: Feet supported Sitting balance-Leahy Scale: Good     Standing balance support: Reliant on assistive device for balance, Bilateral upper extremity supported Standing balance-Leahy Scale: Fair Standing balance comment: SBA with use of RW for mobility                           ADL either performed or assessed with clinical judgement   ADL Overall ADL's : Needs assistance/impaired                                       General ADL Comments: declined ADL performance needed; able to reach down to adjust socks    Extremity/Trunk Assessment              Vision       Perception     Praxis      Cognition Arousal: Alert Behavior During Therapy: WFL for tasks assessed/performed Overall Cognitive Status: Within Functional Limits for tasks assessed  General Comments: pleasant and agreeable to session        Exercises      Shoulder Instructions       General Comments 4L 02 sp02 stable; weaned to 3L and maintained 90-92%; HR up to 137 with activity    Pertinent Vitals/ Pain       Pain Assessment Pain Assessment: No/denies pain  Home Living                                          Prior Functioning/Environment              Frequency  Min 1X/week        Progress Toward Goals  OT Goals(current goals can now be found in the care plan section)  Progress towards OT goals: Progressing toward goals  Acute Rehab OT Goals Patient Stated Goal: improve breathing OT Goal Formulation: With patient Time For Goal Achievement: 08/13/23 Potential to Achieve Goals: Good  Plan       Co-evaluation                 AM-PAC OT 6 Clicks Daily Activity     Outcome Measure   Help from another person eating meals?: None Help from another person taking care of personal grooming?: None Help from another person toileting, which includes using toliet, bedpan, or urinal?: A Little Help from another person bathing (including washing, rinsing, drying)?: A Little Help from another person to put on and taking off regular upper body clothing?: None Help from another person to put on and taking off regular lower body clothing?: A Little 6 Click Score: 21    End of Session Equipment Utilized During Treatment: Oxygen  OT Visit Diagnosis: Other abnormalities of gait and mobility (R26.89);Muscle weakness (generalized) (M62.81)   Activity Tolerance Patient tolerated treatment well   Patient Left in bed;with call bell/phone within reach;with bed alarm set;with family/visitor present   Nurse Communication Mobility status        Time: 8576-8545 OT Time Calculation (min): 31 min  Charges: OT General Charges $OT Visit: 1 Visit OT Treatments $Therapeutic Activity: 23-37 mins  Leone Mobley, OTR/L  08/05/23, 3:25 PM   Sharvil Hoey E Jerick Khachatryan 08/05/2023, 3:22 PM

## 2023-08-05 NOTE — Progress Notes (Signed)
 Progress Note   Patient: Stephanie Small FMW:[id number] DOB: September 25, 1945 DOA: 07/29/2023     7 DOS: the patient was seen and examined on 08/05/2023   Brief hospital course: 78 year old female past medical history of COPD, chronic pain, hypothyroidism, type 2 diabetes mellitus, hypertension, hyperlipidemia presented to the ED with shortness of breath.  She has been feeling unwell for the last week.  Her household is also all sick.  Developed shortness of breath within 24 hours of coming in and quickly worsened.  She is found to have a pulse ox of 89% on 12 L of high flow nasal cannula.  Found to be positive for influenza A.  Also had an elevated troponin but denies chest pain.  1/30.  Patient was on high flow nasal cannula 12 L and decrease down to 10 L.  Feels better than when she came in but still not feeling great. 1/31.  Creatinine improved to 1.02.  Patient still having shortness of breath.  Daughter concerned that Duragesic  patch was held initially.  The order was in the computer as of yesterday.  Patient went into rapid atrial fibrillation in the late afternoon requiring to be put on Cardizem  drip. 2/1.  Will switch heparin  drip back to Eliquis .  Will try to taper off Cardizem  drip by starting oral Cardizem  since heart rate better controlled. 2/2.  High flow nasal cannula down to 7 L.  Patient feeling a little bit better.  Required amiodarone  drip last night secondary to high heart rate. 2/3.  High flow nasal cannula down to 4-1/2 L.  Continue amiodarone  drip today. 2/4.  Switched over to 4 L regular nasal cannula.  Will convert off amiodarone  drip over to oral amiodarone .   Assessment and Plan: * Acute respiratory failure with hypoxia and hypercapnia (HCC) Severe hypoxia with oxygen saturation as low as 70% and mild hypercapnia with pCO2 48 in the setting of COPD exacerbation, influenza A, and dehydration.    2/5 -- still on high flow nasal cannula 4 L oxygen this AM --Wean O2 as  tolerated --Incentive spirometer  COPD exacerbation (HCC) Secondary to influenza A. Switch Solu-Medrol  over to prednisone  Continue DuoNeb   Influenza A With pneumonia, Influenza A PCR positive. Completed Tamiflu  Sepsis ruled out did not meet all of the SIRS criteria.  Atrial fibrillation with rapid ventricular response (HCC) Continue Eliquis .   Cardizem  drip >> to oral Cardizem  on 2/1.  Amiodarone  drip started on 2/2 >> oral amiodarone  on 2/4.  Myocardial injury Secondary to demand ischemia with acute respiratory failure.  Cardiology does not recommend any intervention.  Echocardiogram showed an EF of 60 to 65%.  Hypotension Improved  Acute kidney injury superimposed on CKD (HCC) On CKD stage IIIa.   AKI resolved. Creatinine peaked at 3.02.   Last creatinine normal at 0.84.  Thrush Nystatin  swish and swallow.  Anxiety 1 dose of Ativan  given. Monitor and treat PRN  Generalized abdominal pain Resolved  Hyperkalemia 1 dose of Lokelma  on 2/2.  Hyponatremia Mild, Na is 134 Monitor BMP  Hypothyroidism Continue Synthroid  at lower dose with TSH on the lower side.  Chronic pain syndrome Continue home fentanyl  patch, as needed oxycodone .   Type 2 diabetes mellitus with stage 3 chronic kidney disease (HCC) Hemoglobin A1c 7.3.  On low-dose Semglee  insulin  and sliding scale.  Essential hypertension Continue oral Cardizem         Subjective: Patient seen with son at bedside this AM.  She reports feeling better.  Does not use oxygen  at home normally.  Still on 4 L this AM.  Otherwise no acute complaints and feels improved.     Physical Exam: Vitals:   08/05/23 0346 08/05/23 0500 08/05/23 0759 08/05/23 1117  BP: 113/88  133/84 132/89  Pulse: 90  89 73  Resp: 19  19 20   Temp: 97.8 F (36.6 C)  98 F (36.7 C) 98.8 F (37.1 C)  TempSrc: Oral  Oral Oral  SpO2: 97%  93% 94%  Weight:  101.3 kg    Height:        General exam: awake, alert, no acute  distress HEENT: moist mucus membranes, hearing grossly normal  Respiratory system: CTAB, no wheezes, rales or rhonchi, normal respiratory effort. Cardiovascular system: normal S1/S2,  RRR.   Gastrointestinal system: soft, NT, ND, no HSM felt, +bowel sounds. Central nervous system: A&O x3. no gross focal neurologic deficits, normal speech Extremities: moves all, no edema, normal tone Skin: dry, intact, normal temperature Psychiatry: normal mood, congruent affect, judgement and insight appear normal     Data Reviewed: Notable labs from 2/4 --  Sodium 134, Cl 97, Glucose 155 hemoglobin 11.6, platelet count 3 of 3  Family Communication: Spoke with daughter on the phone and son at the bedside  Disposition: Status is: Inpatient Remains inpatient appropriate because: Still weaning oxygen    Planned Discharge Destination: Home with Home Health     Time spent: 36 minutes  Author: Burnard DELENA Cunning, DO 08/05/2023 2:45 PM  For on call review [email or web address].

## 2023-08-05 NOTE — Progress Notes (Signed)
 Physical Therapy Treatment Patient Details Name: Stephanie Small MRN: [id number] DOB: 1945/07/12 Today's Date: 08/05/2023   History of Present Illness 78 y.o. female with medical history significant of COPD, chronic pain syndrome, hypothyroidism, type 2 diabetes, hypertension, hyperlipidemia, who presents to the ED due to shortness of breath.  Pt found to be + for flu. Pt recently started on amio drip on 08/02/23 along with elevated K+. Per discussion with MD, safe to work with therapy this date.    PT Comments  Pt is making good progress towards goals with ability to ambulate further distance in hallway. All mobility performed on 4L of O2 with 1 standing rest break required. Recommend use of RW due to impaired balance and pt reaching for railing in hallway. Able to perform IS at consistently. Will continue to progress as able.   If plan is discharge home, recommend the following: A little help with walking and/or transfers;A little help with bathing/dressing/bathroom   Can travel by private vehicle        Equipment Recommendations  Rolling walker (2 wheels)    Recommendations for Other Services       Precautions / Restrictions Precautions Precautions: Fall Precaution Comments: watch HR and sp02 Restrictions Weight Bearing Restrictions Per Provider Order: No     Mobility  Bed Mobility Overal bed mobility: Needs Assistance Bed Mobility: Supine to Sit     Supine to sit: Supervision     General bed mobility comments: safe technique with ease of transfer    Transfers Overall transfer level: Needs assistance Equipment used: None Transfers: Sit to/from Stand Sit to Stand: Supervision           General transfer comment: gave SPC once in standing. All mobility performed on 4L of O2    Ambulation/Gait Ambulation/Gait assistance: Contact guard assist Gait Distance (Feet): 120 Feet Assistive device: Straight cane Gait Pattern/deviations: Step-through pattern        General Gait Details: ambulated in hallway with SPC and 1 standing rest break with exertion. O2 sats monitored at 92% with exertion, HR elevated to 149bpm. Cues for pursed lip breathing. Slight unsteadiness noted. Reaches for railing while in hallway. Recommend RW for further ambulation   Stairs             Wheelchair Mobility     Tilt Bed    Modified Rankin (Stroke Patients Only)       Balance Overall balance assessment: Needs assistance Sitting-balance support: Feet supported Sitting balance-Leahy Scale: Good     Standing balance support: Single extremity supported Standing balance-Leahy Scale: Fair                              Cognition Arousal: Alert Behavior During Therapy: WFL for tasks assessed/performed Overall Cognitive Status: Within Functional Limits for tasks assessed                                 General Comments: pleasant and agreeable to session        Exercises      General Comments        Pertinent Vitals/Pain Pain Assessment Pain Assessment: No/denies pain    Home Living                          Prior Function  PT Goals (current goals can now be found in the care plan section) Acute Rehab PT Goals Patient Stated Goal: Get breathing better and go home PT Goal Formulation: With patient Time For Goal Achievement: 08/12/23 Potential to Achieve Goals: Good Progress towards PT goals: Progressing toward goals    Frequency    Min 1X/week      PT Plan      Co-evaluation              AM-PAC PT 6 Clicks Mobility   Outcome Measure  Help needed turning from your back to your side while in a flat bed without using bedrails?: None Help needed moving from lying on your back to sitting on the side of a flat bed without using bedrails?: None Help needed moving to and from a bed to a chair (including a wheelchair)?: A Little Help needed standing up from a chair using your  arms (e.g., wheelchair or bedside chair)?: A Little Help needed to walk in hospital room?: A Little Help needed climbing 3-5 steps with a railing? : A Lot 6 Click Score: 19    End of Session Equipment Utilized During Treatment: Oxygen Activity Tolerance: Patient tolerated treatment well;Patient limited by fatigue Patient left: in chair;with family/visitor present Nurse Communication: Mobility status PT Visit Diagnosis: Muscle weakness (generalized) (M62.81);Difficulty in walking, not elsewhere classified (R26.2)     Time: 9070-9051 PT Time Calculation (min) (ACUTE ONLY): 19 min  Charges:    $Gait Training: 8-22 mins PT General Charges $$ ACUTE PT VISIT: 1 Visit                     Corean Dade, PT, DPT, GCS [phone]    Chessa Barrasso 08/05/2023, 11:29 AM

## 2023-08-05 NOTE — Plan of Care (Signed)

## 2023-08-05 NOTE — Progress Notes (Signed)
 [facility] Cardiology    SUBJECTIVE: Respiratory status improved resting comfortably in bed asleep no pain minimal cough no sputum production   Vitals:   08/05/23 0346 08/05/23 0500 08/05/23 0759 08/05/23 1117  BP: 113/88  133/84 132/89  Pulse: 90  89 73  Resp: 19  19 20   Temp: 97.8 F (36.6 C)  98 F (36.7 C) 98.8 F (37.1 C)  TempSrc: Oral  Oral Oral  SpO2: 97%  93% 94%  Weight:  101.3 kg    Height:         Intake/Output Summary (Last 24 hours) at 08/05/2023 1354 Last data filed at 08/05/2023 1129 Gross per 24 hour  Intake 480 ml  Output --  Net 480 ml      PHYSICAL EXAM  General: Well developed, well nourished, in no acute distress HEENT:  Normocephalic and atramatic Neck:  No JVD.  Lungs: Clear bilaterally to auscultation and percussion. Heart: HRRR . Normal S1 and S2 without gallops or murmurs.  Abdomen: Bowel sounds are positive, abdomen soft and non-tender  Msk:  Back normal, normal gait. Normal strength and tone for age. Extremities: No clubbing, cyanosis or edema.   Neuro: Alert and oriented X 3. Psych:  Good affect, responds appropriately   LABS: Basic Metabolic Panel: Recent Labs    08/04/23 0715  NA 134*  K 4.8  CL 97*  CO2 27  GLUCOSE 155*  BUN 17  CREATININE 0.84  CALCIUM  8.9   Liver Function Tests: No results for input(s): AST, ALT, ALKPHOS, BILITOT, PROT, ALBUMIN in the last 72 hours. No results for input(s): LIPASE, AMYLASE in the last 72 hours. CBC: Recent Labs    08/04/23 0715  WBC 9.9  HGB 11.6*  HCT 35.3*  MCV 93.1  PLT 303   Cardiac Enzymes: No results for input(s): CKTOTAL, CKMB, CKMBINDEX, TROPONINI in the last 72 hours. BNP: Invalid input(s): POCBNP D-Dimer: No results for input(s): DDIMER in the last 72 hours. Hemoglobin A1C: No results for input(s): HGBA1C in the last 72 hours. Fasting Lipid Panel: No results for input(s): CHOL, HDL, LDLCALC, TRIG, CHOLHDL, LDLDIRECT in the last  72 hours. Thyroid  Function Tests: No results for input(s): TSH, T4TOTAL, T3FREE, THYROIDAB in the last 72 hours.  Invalid input(s): FREET3 Anemia Panel: No results for input(s): VITAMINB12, FOLATE, FERRITIN, TIBC, IRON, RETICCTPCT in the last 72 hours.  No results found.   Echo of left ventricular function EF of 60%  TELEMETRY: Atrial fibrillation rate of around 100:  ASSESSMENT AND PLAN:  Principal Problem:   Acute respiratory failure with hypoxia and hypercapnia (HCC) Active Problems:   COPD exacerbation (HCC)   Essential hypertension   Type 2 diabetes mellitus with stage 3 chronic kidney disease (HCC)   Atrial fibrillation with rapid ventricular response (HCC)   Chronic pain syndrome   Hypotension   Influenza A   Acute kidney injury superimposed on CKD (HCC)   Hypothyroidism   Myocardial injury   Hyponatremia   Hyperkalemia   Generalized abdominal pain   Anxiety   Thrush    Plan Acute respiratory failure with hypoxemia hypercapnia continue supplemental oxygen inhalers treatment for influenza A with dehydration Recommend pulmonary input to help with respiratory support treatment COPD exacerbation secondary to influenza A on steroid therapy nebulizers supplemental oxygen as necessary Atrial fibrillation rapid ventricular sponsor on amiodarone  therapy currently on p.o. rate control with metoprolol Cardizem  Diabetes type 2 uncomplicated except for renal insufficiency continue sliding scale coverage Conservative cardiac input at this stage do not  recommend any invasive procedures Anticoagulation for A-fib ultimately Eliquis  rate control with Cardizem  rhythm control with amiodarone    Cara JONETTA Lovelace, MD 08/05/2023 1:54 PM

## 2023-08-06 DIAGNOSIS — J9602 Acute respiratory failure with hypercapnia: Secondary | ICD-10-CM | POA: Diagnosis not present

## 2023-08-06 DIAGNOSIS — J9601 Acute respiratory failure with hypoxia: Secondary | ICD-10-CM | POA: Diagnosis not present

## 2023-08-06 LAB — GLUCOSE, CAPILLARY: Glucose-Capillary: 97 mg/dL (ref 70–99)

## 2023-08-06 MED ORDER — PREDNISONE 10 MG PO TABS: ORAL_TABLET | ORAL | 0 refills | Status: AC

## 2023-08-06 MED ORDER — LEVOTHYROXINE SODIUM 50 MCG PO TABS: 50.0000 ug | ORAL_TABLET | Freq: Every day | ORAL | 2 refills | Status: DC

## 2023-08-06 MED ORDER — DILTIAZEM HCL ER COATED BEADS 180 MG PO CP24: 360.0000 mg | ORAL_CAPSULE | Freq: Every day | ORAL | Status: DC

## 2023-08-06 MED ORDER — IPRATROPIUM-ALBUTEROL 20-100 MCG/ACT IN AERS: 1.0000 | INHALATION_SPRAY | Freq: Three times a day (TID) | RESPIRATORY_TRACT | 0 refills | Status: DC

## 2023-08-06 MED ORDER — DILTIAZEM HCL ER COATED BEADS 360 MG PO CP24: 360.0000 mg | ORAL_CAPSULE | Freq: Every day | ORAL | 1 refills | Status: DC

## 2023-08-06 MED ORDER — DILTIAZEM HCL ER COATED BEADS 180 MG PO CP24
300.0000 mg | ORAL_CAPSULE | Freq: Every day | ORAL | Status: DC
Start: 2023-08-07 — End: 2023-08-06

## 2023-08-06 MED ORDER — AMIODARONE HCL 400 MG PO TABS: 400.0000 mg | ORAL_TABLET | Freq: Every day | ORAL | 1 refills | Status: DC

## 2023-08-06 MED ORDER — NYSTATIN 100000 UNIT/ML MT SUSP: 5.0000 mL | Freq: Four times a day (QID) | OROMUCOSAL | 0 refills | Status: DC

## 2023-08-06 MED ORDER — DILTIAZEM HCL ER COATED BEADS 120 MG PO CP24
120.0000 mg | ORAL_CAPSULE | Freq: Once | ORAL | Status: AC
  Administered 2023-08-06: 120 mg via ORAL
  Filled 2023-08-06: qty 1

## 2023-08-06 MED ORDER — FLUTICASONE PROPIONATE 50 MCG/ACT NA SUSP: 2.0000 | Freq: Every day | NASAL | Status: DC

## 2023-08-06 NOTE — TOC Transition Note (Signed)
 Transition of Care [facility]) - Discharge Note   Patient Details  Name: Stephanie Small MRN: [id number] Date of Birth: 1946/03/11  [facility] CM/SW Contact:  Eydan Chianese C Lillis Nuttle, RN Phone Number: 08/06/2023, 11:55 AM   Clinical Narrative:    Oxygen request sent to Mitch from [employer].   Cory from [facility] notified of discharge today.    11:56am Spoke with patient's son to advised oxygen would be delivered to patient's room for  transport home and while awaiting arrival of remaining supplies. [name] notified [facility] would contact patient to schedule her SOC within 48 hours. Patient daughter will transport her home.   TOC signing off.           Patient Goals and CMS Choice            Discharge Placement                       Discharge Plan and Services Additional resources added to the After Visit Summary for                                       Social Drivers of Health (SDOH) Interventions SDOH Screenings   Food Insecurity: No Food Insecurity (07/31/2023)  Housing: Low Risk  (07/31/2023)  Transportation Needs: No Transportation Needs (07/31/2023)  Utilities: At Risk (07/31/2023)  Social Connections: Moderately Isolated (07/31/2023)  Tobacco Use: High Risk (05/19/2023)   Received from [facility]     Readmission Risk Interventions    07/30/2023    4:47 PM  Readmission Risk Prevention Plan  Transportation Screening Complete  PCP or Specialist Appt within 3-5 Days Complete  HRI or Home Care Consult Complete  Social Work Consult for Recovery Care Planning/Counseling Complete  Palliative Care Screening Not Applicable  Medication Review [employer]) Complete

## 2023-08-06 NOTE — Progress Notes (Signed)
 Physical Therapy Treatment Patient Details Name: Stephanie Small MRN: [id number] DOB: 03-Oct-1945 Today's Date: 08/06/2023   History of Present Illness 78 y.o. female with medical history significant of COPD, chronic pain syndrome, hypothyroidism, type 2 diabetes, hypertension, hyperlipidemia, who presents to the ED due to shortness of breath.  Pt found to be + for flu. Pt recently started on amio drip on 08/02/23 along with elevated K+. Per discussion with MD, safe to work with therapy this date.    PT Comments  Pt is making good progress towards goals with ability to ambulate in hallway improving balance using Rw vs SPC previous session. Vitals monitored throughout and pt encouraged to keep using IS- not performed this date. Will continue to progress. SaO2 on room air at rest = 94% SaO2 on room air while ambulating = 87% SaO2 on 2 liters of O2 while ambulating = 91%     If plan is discharge home, recommend the following: A little help with walking and/or transfers;A little help with bathing/dressing/bathroom   Can travel by private vehicle        Equipment Recommendations  Rolling walker (2 wheels)    Recommendations for Other Services       Precautions / Restrictions Precautions Precautions: Fall Precaution Comments: watch HR and sp02 Restrictions Weight Bearing Restrictions Per Provider Order: No     Mobility  Bed Mobility               General bed mobility comments: received at EOB with RN staff    Transfers Overall transfer level: Modified independent Equipment used: Rolling walker (2 wheels) Transfers: Sit to/from Stand Sit to Stand: Modified independent (Device/Increase time)           General transfer comment: safe technique with no cues for hand placement. Once standing, upright posture noted.    Ambulation/Gait Ambulation/Gait assistance: Supervision Gait Distance (Feet): 100 Feet Assistive device: Rolling walker (2 wheels) Gait  Pattern/deviations: Step-through pattern       General Gait Details: improved balance this session with ability to ambulate with Rw. O2 sats monitored and HR stable. Reciprocal gait pattern and limited fatigue   Stairs             Wheelchair Mobility     Tilt Bed    Modified Rankin (Stroke Patients Only)       Balance Overall balance assessment: Needs assistance Sitting-balance support: Feet supported Sitting balance-Leahy Scale: Good     Standing balance support: Reliant on assistive device for balance, Bilateral upper extremity supported Standing balance-Leahy Scale: Good                              Cognition Arousal: Alert Behavior During Therapy: WFL for tasks assessed/performed Overall Cognitive Status: Within Functional Limits for tasks assessed                                 General Comments: pleasant and agreeable to session        Exercises      General Comments        Pertinent Vitals/Pain Pain Assessment Pain Assessment: No/denies pain    Home Living                          Prior Function            PT  Goals (current goals can now be found in the care plan section) Acute Rehab PT Goals Patient Stated Goal: Get breathing better and go home PT Goal Formulation: With patient Time For Goal Achievement: 08/12/23 Potential to Achieve Goals: Good Progress towards PT goals: Progressing toward goals    Frequency    Min 1X/week      PT Plan      Co-evaluation              AM-PAC PT 6 Clicks Mobility   Outcome Measure  Help needed turning from your back to your side while in a flat bed without using bedrails?: None Help needed moving from lying on your back to sitting on the side of a flat bed without using bedrails?: None Help needed moving to and from a bed to a chair (including a wheelchair)?: None Help needed standing up from a chair using your arms (e.g., wheelchair or bedside  chair)?: None Help needed to walk in hospital room?: A Little Help needed climbing 3-5 steps with a railing? : A Lot 6 Click Score: 21    End of Session Equipment Utilized During Treatment: Oxygen Activity Tolerance: Patient tolerated treatment well;Patient limited by fatigue Patient left: in chair;with family/visitor present Nurse Communication: Mobility status PT Visit Diagnosis: Muscle weakness (generalized) (M62.81);Difficulty in walking, not elsewhere classified (R26.2)     Time: 9048-8991 PT Time Calculation (min) (ACUTE ONLY): 17 min  Charges:    $Gait Training: 8-22 mins PT General Charges $$ ACUTE PT VISIT: 1 Visit                     Corean Dade, PT, DPT, GCS [phone]    Esraa Seres 08/06/2023, 11:31 AM

## 2023-08-06 NOTE — Plan of Care (Signed)
  Problem: Coping: Goal: Ability to adjust to condition or change in health will improve Outcome: Progressing   Problem: Fluid Volume: Goal: Ability to maintain a balanced intake and output will improve Outcome: Progressing   Problem: Health Behavior/Discharge Planning: Goal: Ability to identify and utilize available resources and services will improve Outcome: Progressing Goal: Ability to manage health-related needs will improve Outcome: Progressing

## 2023-08-06 NOTE — Progress Notes (Addendum)
 [facility] PROGRESS NOTE       Patient ID: [facility] MRN: [id number] DOB/AGE: [birth year] 76 y.o.  Admit date: 07/29/2023 Referring Physician Dr. Clotilda Punter  Primary Physician Orlean Alan HERO, FNP  Primary Cardiologist None Reason for Consultation elevated troponin  HPI: [facility] is a 78 y.o. female  with a past medical history of COPD, hypertension, hyperlipidemia, DM2 who presented to the ED on 07/29/2023 for shortness of breath. Cardiology was consulted for further evaluation.   Interval history: -Patient seen and examined this AM, states she feels overall better today. -States shortness of breath is much improved.  Also endorses improvement in cough. -BP and HR remain stable.  Denies any chest pain. -Currently O2 is being weaned.  Review of systems complete and found to be negative unless listed above    Past Medical History:  Diagnosis Date   Arthritis    Atypical squamous cells of undetermined significance (ASCUS) on Papanicolaou smear of cervix 10/08/2016   Chest pain 09/20/2020   COPD (chronic obstructive pulmonary disease) (HCC)    Diabetes mellitus without complication (HCC)    Diarrhea 01/09/2021   Elevated troponin    Glaucoma 2010   History of adenomatous polyp of colon    Hypertension 2005   Hypomagnesemia 01/09/2021   Knee pain 05/06/2022   Low back pain of over 3 months duration 05/06/2022   Personal history of colonic polyps    Respiratory distress 10/25/2015   Sepsis (HCC) 01/09/2021   Thyroid  cyst    UTI (urinary tract infection) 01/09/2021   Wheezing 10/25/2015    Past Surgical History:  Procedure Laterality Date   CAROTID STENT INSERTION  2011   CHOLECYSTECTOMY  2009   COLONOSCOPY  2007   COLONOSCOPY WITH PROPOFOL  N/A 08/18/2016   Procedure: COLONOSCOPY WITH PROPOFOL ;  Surgeon: Rogelia Copping, MD;  Location: [facility];  Service: Endoscopy;  Laterality: N/A;   FOOT SURGERY     SHOULDER SURGERY  Bilateral 2004   UPPER GI ENDOSCOPY      Medications Prior to Admission  Medication Sig Dispense Refill Last Dose/Taking   apixaban  (ELIQUIS ) 5 MG TABS tablet TAKE 1 TABLET BY MOUTH TWICE DAILY 180 tablet 3 07/29/2023 at  8:00 AM   citalopram  (CELEXA ) 20 MG tablet Take 20 mg by mouth daily.   07/29/2023   cyclobenzaprine  (FLEXERIL ) 5 MG tablet Take 5 mg by mouth 3 (three) times daily as needed for muscle spasms.   Taking As Needed   diclofenac  Sodium (VOLTAREN ) 1 % GEL Apply 1 Application topically 4 (four) times daily.   07/28/2023   fentaNYL  (DURAGESIC ) 75 MCG/HR Place 1 patch onto the skin every 3 (three) days.   Past Week   fluticasone  furoate-vilanterol (BREO ELLIPTA ) 100-25 MCG/INH AEPB Inhale 1 puff into the lungs daily as needed.   Taking As Needed   furosemide  (LASIX ) 20 MG tablet TAKE 1 TABLET BY MOUTH ONCE DAILY 90 tablet 3 07/29/2023   gabapentin  (NEURONTIN ) 400 MG capsule Take 400 mg by mouth 3 (three) times daily.   07/29/2023   isosorbide  mononitrate (IMDUR ) 30 MG 24 hr tablet Take 30 mg by mouth daily.   07/29/2023   levothyroxine  (SYNTHROID ) 75 MCG tablet TAKE 1 TABLET BY MOUTH ONCE DAILY ON AN EMPTY STOMACH. WAIT 30 MINUTES BEFORE TAKING OTHER MEDS. 30 tablet 3 07/29/2023   lidocaine  (LIDODERM ) 5 % Place 1 patch onto the skin daily as needed. Pt has not been using as often due to other meds,  only using PRN   Taking As Needed   losartan  (COZAAR ) 50 MG tablet Take 50 mg by mouth daily.   07/29/2023   naloxone (NARCAN) nasal spray 4 mg/0.1 mL Place 1 spray into the nose once.   Taking   omeprazole  (PRILOSEC) 40 MG capsule Take 40 mg by mouth daily.   07/29/2023   ondansetron  (ZOFRAN ) 4 MG tablet Take 4 mg by mouth every 8 (eight) hours as needed.   Taking As Needed   Oxycodone  HCl 10 MG TABS Take 10 mg by mouth daily.   07/29/2023   predniSONE  (DELTASONE ) 20 MG tablet Take 2 tablets (40 mg total) by mouth daily with breakfast. 10 tablet 0 07/29/2023   rosuvastatin  (CRESTOR ) 20 MG tablet  Take 20 mg by mouth at bedtime.   07/28/2023   sitaGLIPtin (JANUVIA) 100 MG tablet TAKE 1 TABLET BY MOUTH ONCE DAILY 90 tablet 3 07/29/2023   SUMAtriptan  (IMITREX ) 100 MG tablet TAKE 1 TABLET BY MOUTH AS NEEDED FOR HEADACHE. MAX 2 TABLETS PER DAY 36 tablet 3 Taking   tolterodine (DETROL LA) 2 MG 24 hr capsule TAKE 1 CAPSULE BY MOUTH ONCE DAILY 90 capsule 1 07/29/2023   vitamin C (ASCORBIC ACID ) 500 MG tablet Take 500 mg by mouth daily.   07/29/2023   XIFAXAN  550 MG TABS tablet Take 550 mg by mouth 2 (two) times daily.   07/29/2023   amitriptyline  (ELAVIL ) 25 MG tablet Take 1 tablet (25 mg total) by mouth at bedtime. (Patient not taking: Reported on 08/05/2023) 90 tablet 1 Not Taking   glucose blood (ONETOUCH VERIO) test strip Use as instructed 100 each 1    Social History   Socioeconomic History   Marital status: Widowed    Spouse name: Not on file   Number of children: Not on file   Years of education: Not on file   Highest education level: Not on file  Occupational History   Not on file  Tobacco Use   Smoking status: Every Day    Types: E-cigarettes   Smokeless tobacco: Never  Vaping Use   Vaping status: Never Used  Substance and Sexual Activity   Alcohol use: No   Drug use: No   Sexual activity: Not Currently    Birth control/protection: Post-menopausal  Other Topics Concern   Not on file  Social History Narrative   Not on file   Social Drivers of Health   Financial Resource Strain: Not on file  Food Insecurity: No Food Insecurity (07/31/2023)   Hunger Vital Sign    Worried About Running Out of Food in the Last Year: Never true    Ran Out of Food in the Last Year: Never true  Transportation Needs: No Transportation Needs (07/31/2023)   PRAPARE - Administrator, Civil Service (Medical): No    Lack of Transportation (Non-Medical): No  Physical Activity: Not on file  Stress: Not on file  Social Connections: Moderately Isolated (07/31/2023)   Social Connection and  Isolation Panel [NHANES]    Frequency of Communication with Friends and Family: More than three times a week    Frequency of Social Gatherings with Friends and Family: Once a week    Attends Religious Services: More than 4 times per year    Active Member of [employer] or Organizations: No    Attends Banker Meetings: Never    Marital Status: Widowed  Intimate Partner Violence: Not At Risk (07/31/2023)   Humiliation, Afraid, Rape, and Kick questionnaire  Fear of Current or Ex-Partner: No    Emotionally Abused: No    Physically Abused: No    Sexually Abused: No    Family History  Problem Relation Age of Onset   Hypertension Mother      Vitals:   08/05/23 2257 08/06/23 0324 08/06/23 0500 08/06/23 0919  BP: (!) 139/92 129/89  131/82  Pulse: 91 91  (!) 120  Resp: 19 19    Temp: 98.8 F (37.1 C) (!) 97.2 F (36.2 C)  97.6 F (36.4 C)  TempSrc: Oral Oral    SpO2: 97% 96%  91%  Weight:   105.8 kg   Height:        PHYSICAL EXAM General: Chronically ill appearing elderly female, well nourished, in no acute distress. HEENT: Normocephalic and atraumatic. Neck: No JVD.  Lungs: Increased respiratory effort on 2.5 L [place]. Course breath sounds bilaterally.  Heart: HRRR. Normal S1 and S2 without gallops or murmurs.  Abdomen: Non-distended appearing.  Msk: Normal strength and tone for age. Extremities: Warm and well perfused. No clubbing, cyanosis. No edema.  Neuro: Alert and oriented X 3. Psych: Answers questions appropriately.   Labs: Basic Metabolic Panel: Recent Labs    08/04/23 0715  NA 134*  K 4.8  CL 97*  CO2 27  GLUCOSE 155*  BUN 17  CREATININE 0.84  CALCIUM  8.9   Liver Function Tests: No results for input(s): AST, ALT, ALKPHOS, BILITOT, PROT, ALBUMIN in the last 72 hours.  No results for input(s): LIPASE, AMYLASE in the last 72 hours.  CBC: Recent Labs    08/04/23 0715  WBC 9.9  HGB 11.6*  HCT 35.3*  MCV 93.1  PLT 303   Cardiac  Enzymes: No results for input(s): CKTOTAL, CKMB, CKMBINDEX, TROPONINIHS in the last 72 hours.  BNP: No results for input(s): BNP in the last 72 hours.  D-Dimer: No results for input(s): DDIMER in the last 72 hours. Hemoglobin A1C: No results for input(s): HGBA1C in the last 72 hours. Fasting Lipid Panel: No results for input(s): CHOL, HDL, LDLCALC, TRIG, CHOLHDL, LDLDIRECT in the last 72 hours. Thyroid  Function Tests: No results for input(s): TSH, T4TOTAL, T3FREE, THYROIDAB in the last 72 hours.  Invalid input(s): FREET3  Anemia Panel: No results for input(s): VITAMINB12, FOLATE, FERRITIN, TIBC, IRON, RETICCTPCT in the last 72 hours.   Radiology: [facility] Chest Port 1 View Result Date: 08/01/2023 CLINICAL DATA:  [id number] Acute respiratory failure with hypoxia [facility]) [id number] EXAM: PORTABLE CHEST - 1 VIEW COMPARISON:  07/29/2023 FINDINGS: Scattered coarse interstitial and airspace infiltrates in both lungs, increased from previous. Heart size and mediastinal contours are within normal limits. Aortic Atherosclerosis (ICD10-170.0). No effusion. Visualized bones unremarkable. IMPRESSION: Increasing bilateral scattered pulmonary opacities. Electronically Signed   By: JONETTA Faes M.D.   On: 08/01/2023 08:58   ECHOCARDIOGRAM COMPLETE Result Date: 07/31/2023    ECHOCARDIOGRAM REPORT   Patient Name:   GORDON CARLSON Date of Exam: 07/31/2023 Medical Rec #:  [id number]         Height:       64.0 in Accession #:    [id number]        Weight:       215.0 lb Date of Birth:  October 25, 1945        BSA:          2.017 m Patient Age:    77 years          BP:  111/68 mmHg Patient Gender: F                 HR:           72 bpm. Exam Location:  [facility] Procedure: 2D Echo, Cardiac Doppler and Color Doppler Indications:     Elevated Troponin  History:         Patient has prior history of Echocardiogram examinations, most                  recent 09/21/2020. COPD; Risk  Factors:Hypertension.  Sonographer:     Christopher Furnace Referring Phys:  [id number] Caysen Whang Diagnosing Phys: Cara JONETTA Lovelace MD IMPRESSIONS  1. Left ventricular ejection fraction, by estimation, is 60 to 65%. The left ventricle has normal function. The left ventricle has no regional wall motion abnormalities. There is moderate concentric left ventricular hypertrophy. Left ventricular diastolic parameters are consistent with Grade II diastolic dysfunction (pseudonormalization).  2. Right ventricular systolic function is normal. The right ventricular size is normal.  3. The mitral valve is normal in structure. Mild to moderate mitral valve regurgitation.  4. The aortic valve is normal in structure. Aortic valve regurgitation is not visualized. FINDINGS  Left Ventricle: Left ventricular ejection fraction, by estimation, is 60 to 65%. The left ventricle has normal function. The left ventricle has no regional wall motion abnormalities. The left ventricular internal cavity size was normal in size. There is  moderate concentric left ventricular hypertrophy. Left ventricular diastolic parameters are consistent with Grade II diastolic dysfunction (pseudonormalization). Right Ventricle: The right ventricular size is normal. No increase in right ventricular wall thickness. Right ventricular systolic function is normal. Left Atrium: Left atrial size was normal in size. Right Atrium: Right atrial size was normal in size. Pericardium: There is no evidence of pericardial effusion. Mitral Valve: The mitral valve is normal in structure. Mild to moderate mitral valve regurgitation. Tricuspid Valve: The tricuspid valve is normal in structure. Tricuspid valve regurgitation is mild. Aortic Valve: The aortic valve is normal in structure. Aortic valve regurgitation is not visualized. Aortic valve mean gradient measures 6.0 mmHg. Aortic valve peak gradient measures 11.0 mmHg. Aortic valve area, by VTI measures 1.84 cm. Pulmonic Valve:  The pulmonic valve was normal in structure. Pulmonic valve regurgitation is not visualized. Aorta: The ascending aorta was not well visualized. IAS/Shunts: No atrial level shunt detected by color flow Doppler.  LEFT VENTRICLE PLAX 2D LVIDd:         4.90 cm   Diastology LVIDs:         3.30 cm   LV e' medial:    10.60 cm/s LV PW:         1.30 cm   LV E/e' medial:  10.4 LV IVS:        1.40 cm   LV e' lateral:   11.60 cm/s LVOT diam:     2.20 cm   LV E/e' lateral: 9.5 LV SV:         62 LV SV Index:   31 LVOT Area:     3.80 cm  RIGHT VENTRICLE RV Basal diam:  2.90 cm RV Mid diam:    2.70 cm LEFT ATRIUM           Index        RIGHT ATRIUM           Index LA diam:      4.30 cm 2.13 cm/m   RA Area:     15.60 cm  LA Vol (A2C): 42.1 ml 20.87 ml/m  RA Volume:   39.00 ml  19.33 ml/m LA Vol (A4C): 85.1 ml 42.18 ml/m  AORTIC VALVE AV Area (Vmax):    2.07 cm AV Area (Vmean):   1.89 cm AV Area (VTI):     1.84 cm AV Vmax:           166.00 cm/s AV Vmean:          117.000 cm/s AV VTI:            0.336 m AV Peak Grad:      11.0 mmHg AV Mean Grad:      6.0 mmHg LVOT Vmax:         90.20 cm/s LVOT Vmean:        58.100 cm/s LVOT VTI:          0.163 m LVOT/AV VTI ratio: 0.49  AORTA Ao Root diam: 3.80 cm MITRAL VALVE                TRICUSPID VALVE MV Area (PHT): 3.31 cm     TR Peak grad:   17.6 mmHg MV Decel Time: 229 msec     TR Vmax:        210.00 cm/s MV E velocity: 110.00 cm/s MV A velocity: 67.10 cm/s   SHUNTS MV E/A ratio:  1.64         Systemic VTI:  0.16 m                             Systemic Diam: 2.20 cm Cara JONETTA Lovelace MD Electronically signed by Cara JONETTA Lovelace MD Signature Date/Time: 07/31/2023/2:16:51 PM    Final    DG Chest Port 1 View Result Date: 07/29/2023 CLINICAL DATA:  Sepsis EXAM: PORTABLE CHEST 1 VIEW COMPARISON:  X-ray and CTA 07/26/2021.  Older x-rays as well FINDINGS: Enlarged cardiopericardial silhouette. Calcified aorta. Chronic interstitial changes with some hyperinflation. Slight prominence of  central vasculature. No pneumothorax, effusion or consolidation. Overlapping cardiac leads. Curvature and degenerative changes of the spine. IMPRESSION: Enlarged cardiopericardial silhouette. Likely chronic appearing lung changes. Electronically Signed   By: Ranell Bring M.D.   On: 07/29/2023 15:09    ECHO ordered  TELEMETRY reviewed by me 08/06/2023: Atrial fibrillation rate 100s  EKG reviewed by me: sinus rhythm PACs rate 87 bpm, nonischemic  Data reviewed by me 08/06/2023: last 24h vitals tele labs imaging I/O hospitalist progress note  Principal Problem:   Acute respiratory failure with hypoxia and hypercapnia (HCC) Active Problems:   COPD exacerbation (HCC)   Essential hypertension   Type 2 diabetes mellitus with stage 3 chronic kidney disease (HCC)   Atrial fibrillation with rapid ventricular response (HCC)   Chronic pain syndrome   Hypotension   Influenza A   Acute kidney injury superimposed on CKD (HCC)   Hypothyroidism   Myocardial injury   Hyponatremia   Hyperkalemia   Generalized abdominal pain   Anxiety   Thrush    ASSESSMENT AND PLAN:  YARIELIZ WASSER is a 78 y.o. female  with a past medical history of COPD, hypertension, hyperlipidemia, DM2 who presented to the ED on 07/29/2023 for shortness of breath. Cardiology was consulted for further evaluation.   # Acute hypoxic respiratory failure # COPD # Demand ischemia # AKI Patient presenting with SOB, cough worsening for 2 weeks.  Lactic acid mildly elevated at 2.0.  BNP 425.  Initial troponin 357.  EKG  normal sinus rhythm without acute ischemic changes.  Chest x-ray with chronic appearing lung changes. Flu A positive. Troponins downtrending. Cr up to 3.0 since admission.  Echo this admission with EF 60-65%, no WMA's. -Suspect troponin elevation 2/2 demand ischemia from acute respiratory failure and not ACS as patient remains chest pain free.  -Continue Crestor  20 mg daily. -No plan for further cardiac diagnostics at  this time.   # New onset atrial fibrillation # Atrial fibrillation RVR Patient had new onset of A-fib this admission with elevated rates.  She was started on amiodarone  for rhythm control and diltiazem  for rate control. -Increase diltiazem  to 360 mg daily for improved rate control.  Continue amiodarone  400 mg daily for rhythm control. -Continue Eliquis  5 mg twice daily for stroke risk reduction.  This patient's plan of care was discussed and created with Dr. Florencio and he is in agreement.  Signed: Danita Bloch, PA-C  08/06/2023, 9:40 AM [facility]

## 2023-08-06 NOTE — Discharge Summary (Signed)
 Physician Discharge Summary   Patient: Stephanie Small MRN: [id number] DOB: 1946/02/06  Admit date:     07/29/2023  Discharge date: 08/06/2023  Discharge Physician: Burnard DELENA Cunning   PCP: Orlean Alan HERO, FNP   Recommendations at discharge:   Follow up with Cardiology Follow up with Primary Care Repeat CBC, BMP, Mg at follow up Follow up on glycemic control and adjust regimen as indicated Follow up on BP control and resume losartan  if/when appropriate Follow up on HR control with A-fib  Discharge Diagnoses: Principal Problem:   Acute respiratory failure with hypoxia and hypercapnia (HCC) Active Problems:   COPD exacerbation (HCC)   Atrial fibrillation with rapid ventricular response (HCC)   Influenza A   Myocardial injury   Hypotension   Acute kidney injury superimposed on CKD (HCC)   Essential hypertension   Type 2 diabetes mellitus with stage 3 chronic kidney disease (HCC)   Chronic pain syndrome   Hypothyroidism   Hyponatremia   Hyperkalemia   Generalized abdominal pain   Anxiety   Thrush  Resolved Problems:   * No resolved hospital problems. *  Hospital Course: 78 year old female past medical history of COPD, chronic pain, hypothyroidism, type 2 diabetes mellitus, hypertension, hyperlipidemia presented to the ED with shortness of breath.  She has been feeling unwell for the last week.  Her household is also all sick.  Developed shortness of breath within 24 hours of coming in and quickly worsened.  She is found to have a pulse ox of 89% on 12 L of high flow nasal cannula.  Found to be positive for influenza A.  Also had an elevated troponin but denies chest pain.  1/30.  Patient was on high flow nasal cannula 12 L and decrease down to 10 L.  Feels better than when she came in but still not feeling great. 1/31.  Creatinine improved to 1.02.  Patient still having shortness of breath.  Daughter concerned that Duragesic  patch was held initially.  The order was in the  computer as of yesterday.  Patient went into rapid atrial fibrillation in the late afternoon requiring to be put on Cardizem  drip. 2/1.  Will switch heparin  drip back to Eliquis .  Will try to taper off Cardizem  drip by starting oral Cardizem  since heart rate better controlled. 2/2.  High flow nasal cannula down to 7 L.  Patient feeling a little bit better.  Required amiodarone  drip last night secondary to high heart rate. 2/3.  High flow nasal cannula down to 4-1/2 L.  Continue amiodarone  drip today. 2/4.  Switched over to 4 L regular nasal cannula.  Will convert off amiodarone  drip over to oral amiodarone .  2/5. Weaned to 2 3L  2/6 -- weaned to 2 L and feeling much improved. Pt is medically stable and requesting discharge home with oxygen today.  Son at bedside and in agreement.   Assessment and Plan:  Acute respiratory failure with hypoxia and hypercapnia (HCC) Severe hypoxia with oxygen saturation as low as 70% and mild hypercapnia with pCO2 48 in the setting of COPD exacerbation, influenza A, and dehydration.     2/5 -- 4 L oxygen >> 3 L this afternoon 2/6 -- on 2 L/min.  Qualifies for home oxygen which TOC arranged. --Wean O2 as tolerated at follow up --Incentive spirometer   COPD exacerbation (HCC) Secondary to influenza A. Completed steroids with IV Solu-Medrol  and prednisone  Treated with  DuoNebs. Will d/c with Combivent .     Influenza A With pneumonia,  Influenza A PCR positive. Completed Tamiflu  Sepsis ruled out did not meet all of the SIRS criteria.   Atrial fibrillation with rapid ventricular response (HCC) Continue Eliquis .   Cardizem  drip >> to oral Cardizem  on 2/1.   Amiodarone  drip started on 2/2 >> oral amiodarone  on 2/4. --Follow up as scheduled with Cardiology   Myocardial injury Secondary to demand ischemia with acute respiratory failure.  Cardiology does not recommend any intervention.  Echocardiogram showed an EF of 60 to 65%.   Hypotension Improved    Acute kidney injury superimposed on CKD (HCC) On CKD stage IIIa.   AKI resolved. Cr normalized. Creatinine peaked at 3.02.     Thrush Nystatin  swish and swallow.   Anxiety 1 dose of Ativan  given. Monitor and treat PRN   Generalized abdominal pain Resolved   Hyperkalemia 1 dose of Lokelma  on 2/2.   Hyponatremia Mild, Na is 134 Monitor BMP   Hypothyroidism Continue Synthroid  at lower dose with TSH on the lower side.   Chronic pain syndrome Continue home fentanyl  patch, as needed oxycodone .    Type 2 diabetes mellitus with stage 3 chronic kidney disease (HCC) Hemoglobin A1c 7.3.  On low-dose Semglee  insulin  and sliding scale.   Essential hypertension Continue oral Cardizem        Consultants: Cardiology  Procedures performed: None   Disposition: Home  Diet recommendation:  Heart healthy  DISCHARGE MEDICATION: Allergies as of 08/06/2023       Reactions   Shellfish-derived Products Anaphylaxis, Rash   Allergic to oysters, not sure if she's allergic to shellfish.  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   Shellfish Allergy Rash        Medication List     PAUSE taking these medications    losartan  50 MG tablet Wait to take this until your doctor or other care provider tells you to start again. Commonly known as: COZAAR  Take 50 mg by mouth daily.       STOP taking these medications    amitriptyline  25 MG tablet Commonly known as: ELAVIL    predniSONE  20 MG tablet Commonly known as: DELTASONE        TAKE these medications    amiodarone  400 MG tablet Commonly known as: PACERONE  Take 1 tablet (400 mg total) by mouth daily.   ascorbic acid  500 MG tablet Commonly known as: VITAMIN C Take 500 mg by mouth daily.   citalopram  20 MG tablet Commonly known as: CELEXA  Take 20 mg by mouth daily.   cyclobenzaprine  5 MG tablet Commonly known as: FLEXERIL  Take 5 mg by mouth 3 (three) times daily as needed for muscle  spasms.   diclofenac  Sodium 1 % Gel Commonly known as: VOLTAREN  Apply 1 Application topically 4 (four) times daily.   diltiazem  360 MG 24 hr capsule Commonly known as: CARDIZEM  CD Take 1 capsule (360 mg total) by mouth daily.   Eliquis  5 MG Tabs tablet Generic drug: apixaban  TAKE 1 TABLET BY MOUTH TWICE DAILY   fentaNYL  75 MCG/HR Commonly known as: DURAGESIC  Place 1 patch onto the skin every 3 (three) days.   fluticasone  50 MCG/ACT nasal spray Commonly known as: FLONASE  Place 2 sprays into both nostrils daily.   fluticasone  furoate-vilanterol 100-25 MCG/INH Aepb Commonly known as: BREO ELLIPTA  Inhale 1 puff into the lungs daily as needed.   furosemide  20 MG tablet Commonly known as: LASIX  TAKE 1 TABLET BY MOUTH ONCE DAILY   gabapentin  400 MG capsule Commonly known as: NEURONTIN  Take 400 mg  by mouth 3 (three) times daily.   Ipratropium-Albuterol  20-100 MCG/ACT Aers respimat Commonly known as: COMBIVENT  Inhale 1 puff into the lungs 3 (three) times daily.   isosorbide  mononitrate 30 MG 24 hr tablet Commonly known as: IMDUR  Take 30 mg by mouth daily.   Januvia 100 MG tablet Generic drug: sitaGLIPtin TAKE 1 TABLET BY MOUTH ONCE DAILY   levothyroxine  50 MCG tablet Commonly known as: SYNTHROID  Take 1 tablet (50 mcg total) by mouth daily at 6 (six) AM. What changed:  medication strength See the new instructions.   lidocaine  5 % Commonly known as: LIDODERM  Place 1 patch onto the skin daily as needed. Pt has not been using as often due to other meds, only using PRN   naloxone 4 MG/0.1ML Liqd nasal spray kit Commonly known as: NARCAN Place 1 spray into the nose once.   nystatin  100000 UNIT/ML suspension Commonly known as: MYCOSTATIN  Take 5 mLs (500,000 Units total) by mouth 4 (four) times daily.   omeprazole  40 MG capsule Commonly known as: PRILOSEC Take 40 mg by mouth daily.   ondansetron  4 MG tablet Commonly known as: ZOFRAN  Take 4 mg by mouth every 8  (eight) hours as needed.   OneTouch Verio test strip Generic drug: glucose blood Use as instructed   Oxycodone  HCl 10 MG Tabs Take 10 mg by mouth daily.   rosuvastatin  20 MG tablet Commonly known as: CRESTOR  Take 20 mg by mouth at bedtime.   SUMAtriptan  100 MG tablet Commonly known as: IMITREX  TAKE 1 TABLET BY MOUTH AS NEEDED FOR HEADACHE. MAX 2 TABLETS PER DAY   tolterodine 2 MG 24 hr capsule Commonly known as: DETROL LA TAKE 1 CAPSULE BY MOUTH ONCE DAILY   Xifaxan  550 MG Tabs tablet Generic drug: rifaximin  Take 550 mg by mouth 2 (two) times daily.       ASK your doctor about these medications    predniSONE  10 MG tablet Commonly known as: DELTASONE  Take 2 tablets (20 mg total) by mouth daily with breakfast for 2 days, THEN 1 tablet (10 mg total) daily with breakfast for 2 days. Start taking on: August 06, 2023 Ask about: Should I take this medication?        Follow-up Information     Custovic, Annalee, DO. Go in 1 week(s).   Specialty: Cardiology Contact information: [address] [place] [address] [phone]                Discharge Exam: Fredricka Weights   08/04/23 0500 08/05/23 0500 08/06/23 0500  Weight: 103.2 kg 101.3 kg 105.8 kg   General exam: awake, alert, no acute distress HEENT: atraumatic, clear conjunctiva, anicteric sclera,moist mucus membranes, hearing grossly normal  Respiratory system: CTAB, no wheezes, rales or rhonchi, normal respiratory effort. Cardiovascular system: normal S1/S2, RRR, no JVD, murmurs, rubs, gallops,  no pedal edema.   Gastrointestinal system: soft, NT, ND, no HSM felt, +bowel sounds. Central nervous system: A&O x4. no gross focal neurologic deficits, normal speech Extremities: moves all , no edema, normal tone Skin: dry, intact, normal temperature, normal color, No rashes, lesions or ulcers Psychiatry: normal mood, congruent affect, judgement and insight appear normal   Condition at discharge:  stable  The results of significant diagnostics from this hospitalization (including imaging, microbiology, ancillary and laboratory) are listed below for reference.   Imaging Studies: DG Chest Port 1 View Result Date: 08/01/2023 CLINICAL DATA:  [id number] Acute respiratory failure with hypoxia (HCC) 427266 EXAM: PORTABLE CHEST - 1 VIEW COMPARISON:  07/29/2023 FINDINGS: Scattered coarse interstitial and airspace infiltrates in both lungs, increased from previous. Heart size and mediastinal contours are within normal limits. Aortic Atherosclerosis (ICD10-170.0). No effusion. Visualized bones unremarkable. IMPRESSION: Increasing bilateral scattered pulmonary opacities. Electronically Signed   By: JONETTA Faes M.D.   On: 08/01/2023 08:58   ECHOCARDIOGRAM COMPLETE Result Date: 07/31/2023    ECHOCARDIOGRAM REPORT   Patient Name:   MONETTA LICK Date of Exam: 07/31/2023 Medical Rec #:  [id number]         Height:       64.0 in Accession #:    [id number]        Weight:       215.0 lb Date of Birth:  [birth year]        BSA:          2.017 m Patient Age:    77 years          BP:           111/68 mmHg Patient Gender: F                 HR:           72 bpm. Exam Location:  [facility] Procedure: 2D Echo, Cardiac Doppler and Color Doppler Indications:     Elevated Troponin  History:         Patient has prior history of Echocardiogram examinations, most                  recent 09/21/2020. COPD; Risk Factors:Hypertension.  Sonographer:     Christopher Furnace Referring Phys:  [id number] CARALYN HUDSON Diagnosing Phys: Cara JONETTA Lovelace MD IMPRESSIONS  1. Left ventricular ejection fraction, by estimation, is 60 to 65%. The left ventricle has normal function. The left ventricle has no regional wall motion abnormalities. There is moderate concentric left ventricular hypertrophy. Left ventricular diastolic parameters are consistent with Grade II diastolic dysfunction (pseudonormalization).  2. Right ventricular systolic function is normal. The right  ventricular size is normal.  3. The mitral valve is normal in structure. Mild to moderate mitral valve regurgitation.  4. The aortic valve is normal in structure. Aortic valve regurgitation is not visualized. FINDINGS  Left Ventricle: Left ventricular ejection fraction, by estimation, is 60 to 65%. The left ventricle has normal function. The left ventricle has no regional wall motion abnormalities. The left ventricular internal cavity size was normal in size. There is  moderate concentric left ventricular hypertrophy. Left ventricular diastolic parameters are consistent with Grade II diastolic dysfunction (pseudonormalization). Right Ventricle: The right ventricular size is normal. No increase in right ventricular wall thickness. Right ventricular systolic function is normal. Left Atrium: Left atrial size was normal in size. Right Atrium: Right atrial size was normal in size. Pericardium: There is no evidence of pericardial effusion. Mitral Valve: The mitral valve is normal in structure. Mild to moderate mitral valve regurgitation. Tricuspid Valve: The tricuspid valve is normal in structure. Tricuspid valve regurgitation is mild. Aortic Valve: The aortic valve is normal in structure. Aortic valve regurgitation is not visualized. Aortic valve mean gradient measures 6.0 mmHg. Aortic valve peak gradient measures 11.0 mmHg. Aortic valve area, by VTI measures 1.84 cm. Pulmonic Valve: The pulmonic valve was normal in structure. Pulmonic valve regurgitation is not visualized. Aorta: The ascending aorta was not well visualized. IAS/Shunts: No atrial level shunt detected by color flow Doppler.  LEFT VENTRICLE PLAX 2D LVIDd:         4.90 cm   Diastology  LVIDs:         3.30 cm   LV e' medial:    10.60 cm/s LV PW:         1.30 cm   LV E/e' medial:  10.4 LV IVS:        1.40 cm   LV e' lateral:   11.60 cm/s LVOT diam:     2.20 cm   LV E/e' lateral: 9.5 LV SV:         62 LV SV Index:   31 LVOT Area:     3.80 cm  RIGHT VENTRICLE  RV Basal diam:  2.90 cm RV Mid diam:    2.70 cm LEFT ATRIUM           Index        RIGHT ATRIUM           Index LA diam:      4.30 cm 2.13 cm/m   RA Area:     15.60 cm LA Vol (A2C): 42.1 ml 20.87 ml/m  RA Volume:   39.00 ml  19.33 ml/m LA Vol (A4C): 85.1 ml 42.18 ml/m  AORTIC VALVE AV Area (Vmax):    2.07 cm AV Area (Vmean):   1.89 cm AV Area (VTI):     1.84 cm AV Vmax:           166.00 cm/s AV Vmean:          117.000 cm/s AV VTI:            0.336 m AV Peak Grad:      11.0 mmHg AV Mean Grad:      6.0 mmHg LVOT Vmax:         90.20 cm/s LVOT Vmean:        58.100 cm/s LVOT VTI:          0.163 m LVOT/AV VTI ratio: 0.49  AORTA Ao Root diam: 3.80 cm MITRAL VALVE                TRICUSPID VALVE MV Area (PHT): 3.31 cm     TR Peak grad:   17.6 mmHg MV Decel Time: 229 msec     TR Vmax:        210.00 cm/s MV E velocity: 110.00 cm/s MV A velocity: 67.10 cm/s   SHUNTS MV E/A ratio:  1.64         Systemic VTI:  0.16 m                             Systemic Diam: 2.20 cm Cara JONETTA Lovelace MD Electronically signed by Cara JONETTA Lovelace MD Signature Date/Time: 07/31/2023/2:16:51 PM    Final    DG Chest Port 1 View Result Date: 07/29/2023 CLINICAL DATA:  Sepsis EXAM: PORTABLE CHEST 1 VIEW COMPARISON:  X-ray and CTA 07/26/2021.  Older x-rays as well FINDINGS: Enlarged cardiopericardial silhouette. Calcified aorta. Chronic interstitial changes with some hyperinflation. Slight prominence of central vasculature. No pneumothorax, effusion or consolidation. Overlapping cardiac leads. Curvature and degenerative changes of the spine. IMPRESSION: Enlarged cardiopericardial silhouette. Likely chronic appearing lung changes. Electronically Signed   By: Ranell Bring M.D.   On: 07/29/2023 15:09    Microbiology: Results for orders placed or performed during the hospital encounter of 07/29/23  Blood Culture (routine x 2)     Status: None   Collection Time: 07/29/23  1:41 PM   Specimen: BLOOD  Result Value Ref Range Status  Specimen  Description BLOOD BLOOD RIGHT ARM  Final   Special Requests   Final    BOTTLES DRAWN AEROBIC AND ANAEROBIC Blood Culture results may not be optimal due to an inadequate volume of blood received in culture bottles   Culture   Final    NO GROWTH 5 DAYS Performed at [facility], [address] [place], [place] [address]    Report Status 08/03/2023 FINAL  Final  Blood Culture (routine x 2)     Status: None   Collection Time: 07/29/23  1:41 PM   Specimen: BLOOD  Result Value Ref Range Status   Specimen Description BLOOD BLOOD LEFT ARM  Final   Special Requests   Final    BOTTLES DRAWN AEROBIC AND ANAEROBIC Blood Culture results may not be optimal due to an inadequate volume of blood received in culture bottles   Culture   Final    NO GROWTH 5 DAYS Performed at [facility], [address] [place], [place] [address]    Report Status 08/03/2023 FINAL  Final  Resp panel by RT-PCR (RSV, Flu A&B, Covid) Anterior Nasal Swab     Status: Abnormal   Collection Time: 07/29/23  4:33 PM   Specimen: Anterior Nasal Swab  Result Value Ref Range Status   SARS Coronavirus 2 by RT PCR NEGATIVE NEGATIVE Final    Comment: (NOTE) SARS-CoV-2 target nucleic acids are NOT DETECTED.  The SARS-CoV-2 RNA is generally detectable in upper respiratory specimens during the acute phase of infection. The lowest concentration of SARS-CoV-2 viral copies this assay can detect is 138 copies/mL. A negative result does not preclude SARS-Cov-2 infection and should not be used as the sole basis for treatment or other patient management decisions. A negative result may occur with  improper specimen collection/handling, submission of specimen other than nasopharyngeal swab, presence of viral mutation(s) within the areas targeted by this assay, and inadequate number of viral copies(<138 copies/mL). A negative result must be combined with clinical observations, patient history, and  epidemiological information. The expected result is Negative.  Fact Sheet for Patients:  bloggercourse.com  Fact Sheet for Healthcare Providers:  seriousbroker.it  This test is no t yet approved or cleared by the [place]  FDA and  has been authorized for detection and/or diagnosis of SARS-CoV-2 by FDA under an Emergency Use Authorization (EUA). This EUA will remain  in effect (meaning this test can be used) for the duration of the COVID-19 declaration under Section 564(b)(1) of the Act, 21 U.S.C.section 360bbb-3(b)(1), unless the authorization is terminated  or revoked sooner.       Influenza A by PCR POSITIVE (A) NEGATIVE Final   Influenza B by PCR NEGATIVE NEGATIVE Final    Comment: (NOTE) The Xpert Xpress SARS-CoV-2/FLU/RSV plus assay is intended as an aid in the diagnosis of influenza from Nasopharyngeal swab specimens and should not be used as a sole basis for treatment. Nasal washings and aspirates are unacceptable for Xpert Xpress SARS-CoV-2/FLU/RSV testing.  Fact Sheet for Patients: bloggercourse.com  Fact Sheet for Healthcare Providers: seriousbroker.it  This test is not yet approved or cleared by the [place]  FDA and has been authorized for detection and/or diagnosis of SARS-CoV-2 by FDA under an Emergency Use Authorization (EUA). This EUA will remain in effect (meaning this test can be used) for the duration of the COVID-19 declaration under Section 564(b)(1) of the Act, 21 U.S.C. section 360bbb-3(b)(1), unless the authorization is terminated or revoked.     Resp Syncytial Virus by  PCR NEGATIVE NEGATIVE Final    Comment: (NOTE) Fact Sheet for Patients: bloggercourse.com  Fact Sheet for Healthcare Providers: seriousbroker.it  This test is not yet approved or cleared by the [place]  FDA and has  been authorized for detection and/or diagnosis of SARS-CoV-2 by FDA under an Emergency Use Authorization (EUA). This EUA will remain in effect (meaning this test can be used) for the duration of the COVID-19 declaration under Section 564(b)(1) of the Act, 21 U.S.C. section 360bbb-3(b)(1), unless the authorization is terminated or revoked.  Performed at [facility] [address] [place], [place] [address]   Respiratory (~20 pathogens) panel by PCR     Status: Abnormal   Collection Time: 07/31/23  2:59 PM   Specimen: Nasopharyngeal Swab; Respiratory  Result Value Ref Range Status   Adenovirus NOT DETECTED NOT DETECTED Final   Coronavirus 229E NOT DETECTED NOT DETECTED Final    Comment: (NOTE) The Coronavirus on the Respiratory Panel, DOES NOT test for the novel  Coronavirus (2019 nCoV)    Coronavirus HKU1 NOT DETECTED NOT DETECTED Final   Coronavirus NL63 NOT DETECTED NOT DETECTED Final   Coronavirus OC43 NOT DETECTED NOT DETECTED Final   Metapneumovirus NOT DETECTED NOT DETECTED Final   Rhinovirus / Enterovirus NOT DETECTED NOT DETECTED Final   Influenza A H3 DETECTED (A) NOT DETECTED Final   Influenza B NOT DETECTED NOT DETECTED Final   Parainfluenza Virus 1 NOT DETECTED NOT DETECTED Final   Parainfluenza Virus 2 NOT DETECTED NOT DETECTED Final   Parainfluenza Virus 3 NOT DETECTED NOT DETECTED Final   Parainfluenza Virus 4 NOT DETECTED NOT DETECTED Final   Respiratory Syncytial Virus NOT DETECTED NOT DETECTED Final   Bordetella pertussis NOT DETECTED NOT DETECTED Final   Bordetella Parapertussis NOT DETECTED NOT DETECTED Final   Chlamydophila pneumoniae NOT DETECTED NOT DETECTED Final   Mycoplasma pneumoniae NOT DETECTED NOT DETECTED Final    Comment: Performed at [facility], [address], [place], [place] [address]    Labs: CBC: No results for input(s): WBC, NEUTROABS, HGB, HCT, MCV, PLT in the last 168 hours.  Basic Metabolic Panel: No  results for input(s): NA, K, CL, CO2, GLUCOSE, BUN, CREATININE, CALCIUM , MG, PHOS in the last 168 hours.  Liver Function Tests: No results for input(s): AST, ALT, ALKPHOS, BILITOT, PROT, ALBUMIN in the last 168 hours. CBG: Recent Labs  Lab 08/05/23 0853 08/05/23 1238 08/05/23 1625 08/05/23 2057 08/06/23 1200  GLUCAP 133* 143* 271* 210* 97    Discharge time spent: less than 30 minutes.  Signed: Burnard DELENA Cunning, DO [facility] 08/11/2023

## 2023-08-11 ENCOUNTER — Encounter: Payer: Self-pay | Admitting: Cardiovascular Disease

## 2023-08-11 ENCOUNTER — Telehealth: Payer: Self-pay | Admitting: Family

## 2023-08-11 ENCOUNTER — Ambulatory Visit: Payer: Medicare Other | Admitting: Cardiovascular Disease

## 2023-08-11 VITALS — BP 128/74 | HR 94 | Ht 64.0 in | Wt 209.0 lb

## 2023-08-11 DIAGNOSIS — E782 Mixed hyperlipidemia: Secondary | ICD-10-CM | POA: Diagnosis not present

## 2023-08-11 DIAGNOSIS — I10 Essential (primary) hypertension: Secondary | ICD-10-CM

## 2023-08-11 DIAGNOSIS — E1122 Type 2 diabetes mellitus with diabetic chronic kidney disease: Secondary | ICD-10-CM

## 2023-08-11 DIAGNOSIS — I4891 Unspecified atrial fibrillation: Secondary | ICD-10-CM

## 2023-08-11 DIAGNOSIS — R0602 Shortness of breath: Secondary | ICD-10-CM

## 2023-08-11 NOTE — Telephone Encounter (Signed)
Florentina Addison, PT with [facility], left VM requesting verbal orders for PT 2w3 and social work for insurance and community resources.  Verbal orders given to [facility].

## 2023-08-11 NOTE — Progress Notes (Signed)
Cardiology Office Note   Date:  08/11/2023   ID:  Stephanie, Stephanie Small, MRN [id number]  PCP:  Miki Kins, FNP  Cardiologist:  Adrian Blackwater, MD      History of Present Illness: Stephanie Small is a 78 y.o. female who presents for  Chief Complaint  Patient presents with   Follow-up    Hospital follow up      Had pneumonia      Past Medical History:  Diagnosis Date   Arthritis    Atypical squamous cells of undetermined significance (ASCUS) on Papanicolaou smear of cervix 10/08/2016   Chest pain 09/20/2020   COPD (chronic obstructive pulmonary disease) (HCC)    Diabetes mellitus without complication (HCC)    Diarrhea 01/09/2021   Elevated troponin    Glaucoma 2010   History of adenomatous polyp of colon    Hypertension 2005   Hypomagnesemia 01/09/2021   Knee pain 05/06/2022   Low back pain of over 3 months duration 05/06/2022   Personal history of colonic polyps    Respiratory distress 10/25/2015   Sepsis (HCC) 01/09/2021   Thyroid cyst    UTI (urinary tract infection) 01/09/2021   Wheezing 10/25/2015     Past Surgical History:  Procedure Laterality Date   CAROTID STENT INSERTION  2011   CHOLECYSTECTOMY  2009   COLONOSCOPY  2007   COLONOSCOPY WITH PROPOFOL N/A 08/18/2016   Procedure: COLONOSCOPY WITH PROPOFOL;  Surgeon: Midge Minium, MD;  Location: [facility];  Service: Endoscopy;  Laterality: N/A;   FOOT SURGERY     SHOULDER SURGERY Bilateral 2004   UPPER GI ENDOSCOPY       Current Outpatient Medications  Medication Sig Dispense Refill   levothyroxine (SYNTHROID) 50 MCG tablet Take 1 tablet (50 mcg total) by mouth daily at 6 (six) AM. 30 tablet 2   amiodarone (PACERONE) 400 MG tablet Take 1 tablet (400 mg total) by mouth daily. 30 tablet 1   apixaban (ELIQUIS) 5 MG TABS tablet TAKE 1 TABLET BY MOUTH TWICE DAILY 180 tablet 3   citalopram (CELEXA) 20 MG tablet Take 20 mg by mouth daily.     cyclobenzaprine (FLEXERIL) 5  MG tablet Take 5 mg by mouth 3 (three) times daily as needed for muscle spasms.     diclofenac Sodium (VOLTAREN) 1 % GEL Apply 1 Application topically 4 (four) times daily.     diltiazem (CARDIZEM CD) 360 MG 24 hr capsule Take 1 capsule (360 mg total) by mouth daily. 30 capsule 1   fentaNYL (DURAGESIC) 75 MCG/HR Place 1 patch onto the skin every 3 (three) days.     fluticasone (FLONASE) 50 MCG/ACT nasal spray Place 2 sprays into both nostrils daily.     fluticasone furoate-vilanterol (BREO ELLIPTA) 100-25 MCG/INH AEPB Inhale 1 puff into the lungs daily as needed.     furosemide (LASIX) 20 MG tablet TAKE 1 TABLET BY MOUTH ONCE DAILY 90 tablet 3   gabapentin (NEURONTIN) 400 MG capsule Take 400 mg by mouth 3 (three) times daily.     glucose blood (ONETOUCH VERIO) test strip Use as instructed 100 each 1   Ipratropium-Albuterol (COMBIVENT) 20-100 MCG/ACT AERS respimat Inhale 1 puff into the lungs 3 (three) times daily. 1 each 0   isosorbide mononitrate (IMDUR) 30 MG 24 hr tablet Take 30 mg by mouth daily.     lidocaine (LIDODERM) 5 % Place 1 patch onto the skin daily as needed. Pt has not been using as  often due to other meds, only using PRN     [Paused] losartan (COZAAR) 50 MG tablet Take 50 mg by mouth daily.     naloxone (NARCAN) nasal spray 4 mg/0.1 mL Place 1 spray into the nose once.     nystatin (MYCOSTATIN) 100000 UNIT/ML suspension Take 5 mLs (500,000 Units total) by mouth 4 (four) times daily. 60 mL 0   omeprazole (PRILOSEC) 40 MG capsule Take 40 mg by mouth daily.     ondansetron (ZOFRAN) 4 MG tablet Take 4 mg by mouth every 8 (eight) hours as needed.     Oxycodone HCl 10 MG TABS Take 10 mg by mouth daily.     rosuvastatin (CRESTOR) 20 MG tablet Take 20 mg by mouth at bedtime.     sitaGLIPtin (JANUVIA) 100 MG tablet TAKE 1 TABLET BY MOUTH ONCE DAILY 90 tablet 3   SUMAtriptan (IMITREX) 100 MG tablet TAKE 1 TABLET BY MOUTH AS NEEDED FOR HEADACHE. MAX 2 TABLETS PER DAY 36 tablet 3    tolterodine (DETROL LA) 2 MG 24 hr capsule TAKE 1 CAPSULE BY MOUTH ONCE DAILY 90 capsule 1   vitamin C (ASCORBIC ACID) 500 MG tablet Take 500 mg by mouth daily.     XIFAXAN 550 MG TABS tablet Take 550 mg by mouth 2 (two) times daily.     No current facility-administered medications for this visit.    Allergies:   Shellfish-derived products and Shellfish allergy    Social History:   reports that she has been smoking e-cigarettes. She has never used smokeless tobacco. She reports that she does not drink alcohol and does not use drugs.   Family History:  family history includes Hypertension in her mother.    ROS:     Review of Systems  Constitutional: Negative.   HENT: Negative.    Eyes: Negative.   Respiratory: Negative.    Gastrointestinal: Negative.   Genitourinary: Negative.   Musculoskeletal: Negative.   Skin: Negative.   Neurological: Negative.   Endo/Heme/Allergies: Negative.   Psychiatric/Behavioral: Negative.    All other systems reviewed and are negative.     All other systems are reviewed and negative.    PHYSICAL EXAM: VS:  BP 128/74   Pulse 94   Ht 5\' 4"  (1.626 m)   Wt 209 lb (94.8 kg)   SpO2 92%   BMI 35.87 kg/m  , BMI Body mass index is 35.87 kg/m. Last weight:  Wt Readings from Last 3 Encounters:  08/11/23 209 lb (94.8 kg)  08/06/23 233 lb 4 oz (105.8 kg)  01/30/23 220 lb 12.8 oz (100.2 kg)     Physical Exam Constitutional:      Appearance: Normal appearance.  Cardiovascular:     Rate and Rhythm: Normal rate and regular rhythm.     Heart sounds: Normal heart sounds.  Pulmonary:     Effort: Pulmonary effort is normal.     Breath sounds: Normal breath sounds.  Musculoskeletal:     Right lower leg: No edema.     Left lower leg: No edema.  Neurological:     Mental Status: She is alert.       EKG:   Recent Labs: 07/29/2023: B Natriuretic Peptide 425.1; TSH 0.058 07/30/2023: ALT 31 08/04/2023: BUN 17; Creatinine, Ser 0.84; Hemoglobin 11.6;  Platelets 303; Potassium 4.8; Sodium 134    Lipid Panel    Component Value Date/Time   CHOL 117 01/30/2023 1550   TRIG 93 01/30/2023 1550   HDL 55 01/30/2023  1550   CHOLHDL 2.1 01/30/2023 1550   CHOLHDL 2.5 01/10/2021 0659   VLDL 13 01/10/2021 0659   LDLCALC 44 01/30/2023 1550      Other studies Reviewed: Additional studies/ records that were reviewed today include:  Review of the above records demonstrates:       No data to display            ASSESSMENT AND PLAN:    ICD-10-CM   1. Essential hypertension  I10     2. Atrial fibrillation with rapid ventricular response (HCC)  I48.91    Admitted with Afib RVR, now feels fine. LVEF normal. NSR right now    3. Mixed hyperlipidemia  E78.2     4. Type 2 diabetes mellitus with chronic kidney disease, without long-term current use of insulin, unspecified CKD stage (HCC)  E11.22     5. SOB (shortness of breath)  R06.02    may consider stress test later, recovering from pneumonia       Problem List Items Addressed This Visit       Cardiovascular and Mediastinum   Essential hypertension - Primary   Atrial fibrillation with rapid ventricular response (HCC)     Other   HLD (hyperlipidemia)   Other Visit Diagnoses       Type 2 diabetes mellitus with chronic kidney disease, without long-term current use of insulin, unspecified CKD stage (HCC)         SOB (shortness of breath)       may consider stress test later, recovering from pneumonia          Disposition:   Return in about 2 months (around 10/09/2023).    Total time spent: 30 minutes  Signed,  Adrian Blackwater, MD  08/11/2023 12:04 PM    [facility]

## 2023-08-14 ENCOUNTER — Encounter: Payer: Self-pay | Admitting: Family

## 2023-08-14 ENCOUNTER — Ambulatory Visit (INDEPENDENT_AMBULATORY_CARE_PROVIDER_SITE_OTHER): Payer: Medicare Other | Admitting: Family

## 2023-08-14 VITALS — BP 110/78 | HR 93 | Ht 64.0 in | Wt 213.6 lb

## 2023-08-14 DIAGNOSIS — N189 Chronic kidney disease, unspecified: Secondary | ICD-10-CM

## 2023-08-14 DIAGNOSIS — I4891 Unspecified atrial fibrillation: Secondary | ICD-10-CM

## 2023-08-14 DIAGNOSIS — E875 Hyperkalemia: Secondary | ICD-10-CM | POA: Diagnosis not present

## 2023-08-14 DIAGNOSIS — N179 Acute kidney failure, unspecified: Secondary | ICD-10-CM | POA: Diagnosis not present

## 2023-08-14 DIAGNOSIS — E1122 Type 2 diabetes mellitus with diabetic chronic kidney disease: Secondary | ICD-10-CM | POA: Diagnosis not present

## 2023-08-14 DIAGNOSIS — E871 Hypo-osmolality and hyponatremia: Secondary | ICD-10-CM

## 2023-08-14 DIAGNOSIS — J189 Pneumonia, unspecified organism: Secondary | ICD-10-CM

## 2023-08-14 DIAGNOSIS — Z0130 Encounter for examination of blood pressure without abnormal findings: Secondary | ICD-10-CM

## 2023-08-14 LAB — POCT CBG (FASTING - GLUCOSE)-MANUAL ENTRY: Glucose Fasting, POC: 237 mg/dL — AB (ref 70–99)

## 2023-08-14 MED ORDER — DAPAGLIFLOZIN PROPANEDIOL 10 MG PO TABS: 10.0000 mg | ORAL_TABLET | Freq: Every day | ORAL | 3 refills | Status: DC

## 2023-08-14 MED ORDER — ALBUTEROL SULFATE (2.5 MG/3ML) 0.083% IN NEBU: 2.5000 mg | INHALATION_SOLUTION | RESPIRATORY_TRACT | 3 refills | Status: DC | PRN

## 2023-08-14 NOTE — Progress Notes (Signed)
 Established Patient Office Visit  Subjective:  Patient ID: Stephanie Small, female    DOB: 12-14-1945  Age: 78 y.o. MRN: [id number]  Chief Complaint  Patient presents with   Hospitalization Follow-up    Patient is here today for her hospital follow up.  She is feeling somewhat better since her discharge, but she does have some concerns to discuss today: 1) On blood pressure meds, hasn't been taking. Asks if she should be, as she was concerned because her blood pressures had been low.  2) Needs refill on her Lancets.  3) Blood sugar has been going up. Is on Januvia, asks  if we can add something else for her diabetes.  4) Needs  labs to check on her kidney function and to evaluate the cramps in her legs. She has been getting them primarily at night.     No other concerns at this time.   Past Medical History:  Diagnosis Date   Arthritis    Atypical squamous cells of undetermined significance (ASCUS) on Papanicolaou smear of cervix 10/08/2016   Chest pain 09/20/2020   COPD (chronic obstructive pulmonary disease) (HCC)    Diabetes mellitus without complication (HCC)    Diarrhea 01/09/2021   Elevated troponin    Glaucoma 2010   History of adenomatous polyp of colon    Hypertension 2005   Hypomagnesemia 01/09/2021   Knee pain 05/06/2022   Low back pain of over 3 months duration 05/06/2022   Personal history of colonic polyps    Respiratory distress 10/25/2015   Sepsis (HCC) 01/09/2021   Thyroid cyst    UTI (urinary tract infection) 01/09/2021   Wheezing 10/25/2015    Past Surgical History:  Procedure Laterality Date   CAROTID STENT INSERTION  2011   CHOLECYSTECTOMY  2009   COLONOSCOPY  2007   COLONOSCOPY WITH PROPOFOL N/A 08/18/2016   Procedure: COLONOSCOPY WITH PROPOFOL;  Surgeon: Midge Minium, MD;  Location: [facility];  Service: Endoscopy;  Laterality: N/A;   FOOT SURGERY     SHOULDER SURGERY Bilateral 2004   UPPER GI ENDOSCOPY      Social History    Socioeconomic History   Marital status: Widowed    Spouse name: Not on file   Number of children: Not on file   Years of education: Not on file   Highest education level: Not on file  Occupational History   Not on file  Tobacco Use   Smoking status: Every Day    Types: E-cigarettes   Smokeless tobacco: Never  Vaping Use   Vaping status: Never Used  Substance and Sexual Activity   Alcohol use: No   Drug use: No   Sexual activity: Not Currently    Birth control/protection: Post-menopausal  Other Topics Concern   Not on file  Social History Narrative   Not on file   Social Drivers of Health   Financial Resource Strain: Not on file  Food Insecurity: No Food Insecurity (07/31/2023)   Hunger Vital Sign    Worried About Running Out of Food in the Last Year: Never true    Ran Out of Food in the Last Year: Never true  Transportation Needs: No Transportation Needs (07/31/2023)   PRAPARE - Administrator, Civil Service (Medical): No    Lack of Transportation (Non-Medical): No  Physical Activity: Not on file  Stress: Not on file  Social Connections: Moderately Isolated (07/31/2023)   Social Connection and Isolation Panel [NHANES]    Frequency of  Communication with Friends and Family: More than three times a week    Frequency of Social Gatherings with Friends and Family: Once a week    Attends Religious Services: More than 4 times per year    Active Member of [employer] or Organizations: No    Attends Banker Meetings: Never    Marital Status: Widowed  Intimate Partner Violence: Not At Risk (07/31/2023)   Humiliation, Afraid, Rape, and Kick questionnaire    Fear of Current or Ex-Partner: No    Emotionally Abused: No    Physically Abused: No    Sexually Abused: No    Family History  Problem Relation Age of Onset   Hypertension Mother     Allergies  Allergen Reactions   Shellfish-Derived Products Anaphylaxis and Rash    Allergic to oysters, not sure if  she's allergic to shellfish.  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   Shellfish Allergy Rash    Review of Systems  Constitutional:  Positive for malaise/fatigue.  Musculoskeletal:  Positive for myalgias.  All other systems reviewed and are negative.      Objective:   BP 110/78   Pulse 93   Ht 5\' 4"  (1.626 m)   Wt 213 lb 9.6 oz (96.9 kg)   SpO2 93% Comment: 2.5L  BMI 36.66 kg/m   Vitals:   08/14/23 1409  BP: 110/78  Pulse: 93  Height: 5\' 4"  (1.626 m)  Weight: 213 lb 9.6 oz (96.9 kg)  SpO2: 93% Comment: 2.5L  BMI (Calculated): 36.65    Physical Exam Vitals and nursing note reviewed.  Constitutional:      Appearance: Normal appearance. She is normal weight.  HENT:     Head: Normocephalic.  Eyes:     Extraocular Movements: Extraocular movements intact.     Conjunctiva/sclera: Conjunctivae normal.     Pupils: Pupils are equal, round, and reactive to light.  Cardiovascular:     Rate and Rhythm: Normal rate.  Pulmonary:     Effort: Pulmonary effort is normal.  Neurological:     General: No focal deficit present.     Mental Status: She is alert and oriented to person, place, and time. Mental status is at baseline.  Psychiatric:        Mood and Affect: Mood normal.        Behavior: Behavior normal.        Thought Content: Thought content normal.      Results for orders placed or performed in visit on 08/14/23  CMP14+EGFR  Result Value Ref Range   Glucose 206 (H) 70 - 99 mg/dL   BUN 18 8 - 27 mg/dL   Creatinine, Ser 3.24 (H) 0.57 - 1.00 mg/dL   eGFR 52 (L) >40 NU/UVO/5.36   BUN/Creatinine Ratio 16 12 - 28   Sodium 138 134 - 144 mmol/L   Potassium 4.3 3.5 - 5.2 mmol/L   Chloride 99 96 - 106 mmol/L   CO2 25 20 - 29 mmol/L   Calcium 8.5 (L) 8.7 - 10.3 mg/dL   Total Protein 6.0 6.0 - 8.5 g/dL   Albumin 3.9 3.8 - 4.8 g/dL   Globulin, Total 2.1 1.5 - 4.5 g/dL   Bilirubin Total 0.7 0.0 - 1.2 mg/dL   Alkaline Phosphatase 106 44 - 121  IU/L   AST 11 0 - 40 IU/L   ALT 12 0 - 32 IU/L  CBC with Diff  Result Value Ref Range   WBC 9.5 3.4 -  10.8 x10E3/uL   RBC 4.18 3.77 - 5.28 x10E6/uL   Hemoglobin 13.2 11.1 - 15.9 g/dL   Hematocrit 16.1 09.6 - 46.6 %   MCV 95 79 - 97 fL   MCH 31.6 26.6 - 33.0 pg   MCHC 33.2 31.5 - 35.7 g/dL   RDW 04.5 40.9 - 81.1 %   Platelets 312 150 - 450 x10E3/uL   Neutrophils 67 Not Estab. %   Lymphs 21 Not Estab. %   Monocytes 9 Not Estab. %   Eos 1 Not Estab. %   Basos 1 Not Estab. %   Neutrophils Absolute 6.4 1.4 - 7.0 x10E3/uL   Lymphocytes Absolute 2.0 0.7 - 3.1 x10E3/uL   Monocytes Absolute 0.9 0.1 - 0.9 x10E3/uL   EOS (ABSOLUTE) 0.1 0.0 - 0.4 x10E3/uL   Basophils Absolute 0.1 0.0 - 0.2 x10E3/uL   Immature Granulocytes 1 Not Estab. %   Immature Grans (Abs) 0.1 0.0 - 0.1 x10E3/uL  Magnesium  Result Value Ref Range   Magnesium 1.1 (L) 1.6 - 2.3 mg/dL  POCT CBG (Fasting - Glucose)  Result Value Ref Range   Glucose Fasting, POC 237 (A) 70 - 99 mg/dL    Recent Results (from the past 2160 hours)  Lactic acid, plasma     Status: Abnormal   Collection Time: 07/29/23  1:41 PM  Result Value Ref Range   Lactic Acid, Venous 2.0 (HH) 0.5 - 1.9 mmol/L    Comment: CRITICAL RESULT CALLED TO, READ BACK BY AND VERIFIED WITH ABBY BOWMAN RN 1422 07/29/23 HNM Performed at [facility], [address] [place], [place] [address]   Comprehensive metabolic panel     Status: Abnormal   Collection Time: 07/29/23  1:41 PM  Result Value Ref Range   Sodium 129 (L) 135 - 145 mmol/L   Potassium 3.9 3.5 - 5.1 mmol/L   Chloride 96 (L) 98 - 111 mmol/L   CO2 18 (L) 22 - 32 mmol/L   Glucose, Bld 241 (H) 70 - 99 mg/dL    Comment: Glucose reference range applies only to samples taken after fasting for at least 8 hours.   BUN 37 (H) 8 - 23 mg/dL   Creatinine, Ser 2.95 (H) 0.44 - 1.00 mg/dL   Calcium 8.0 (L) 8.9 - 10.3 mg/dL   Total Protein 6.1 (L) 6.5 - 8.1 g/dL   Albumin 3.4 (L) 3.5 - 5.0 g/dL    AST 50 (H) 15 - 41 U/L   ALT 35 0 - 44 U/L   Alkaline Phosphatase 106 38 - 126 U/L   Total Bilirubin 0.6 0.0 - 1.2 mg/dL   GFR, Estimated 18 (L) >60 mL/min    Comment: (NOTE) Calculated using the CKD-EPI Creatinine Equation (2021)    Anion gap 15 5 - 15    Comment: Performed at [facility], [address] [place], [place] [address]  CBC with Differential     Status: Abnormal   Collection Time: 07/29/23  1:41 PM  Result Value Ref Range   WBC 6.7 4.0 - 10.5 K/uL   RBC 3.82 (L) 3.87 - 5.11 MIL/uL   Hemoglobin 12.0 12.0 - 15.0 g/dL   HCT 86.5 78.4 - 69.6 %   MCV 95.5 80.0 - 100.0 fL   MCH 31.4 26.0 - 34.0 pg   MCHC 32.9 30.0 - 36.0 g/dL   RDW 29.5 28.4 - 13.2 %   Platelets 186 150 - 400 K/uL   nRBC 0.0 0.0 - 0.2 %  Neutrophils Relative % 77 %   Neutro Abs 5.1 1.7 - 7.7 K/uL   Lymphocytes Relative 14 %   Lymphs Abs 1.0 0.7 - 4.0 K/uL   Monocytes Relative 9 %   Monocytes Absolute 0.6 0.1 - 1.0 K/uL   Eosinophils Relative 0 %   Eosinophils Absolute 0.0 0.0 - 0.5 K/uL   Basophils Relative 0 %   Basophils Absolute 0.0 0.0 - 0.1 K/uL   Immature Granulocytes 0 %   Abs Immature Granulocytes 0.02 0.00 - 0.07 K/uL    Comment: Performed at [facility], [address] [place], [place] [address]  Protime-INR     Status: Abnormal   Collection Time: 07/29/23  1:41 PM  Result Value Ref Range   Prothrombin Time 20.8 (H) 11.4 - 15.2 seconds   INR 1.8 (H) 0.8 - 1.2    Comment: (NOTE) INR goal varies based on device and disease states. Performed at [facility], [address] [place], [place] [address]   APTT     Status: Abnormal   Collection Time: 07/29/23  1:41 PM  Result Value Ref Range   aPTT 47 (H) 24 - 36 seconds    Comment:        IF BASELINE aPTT IS ELEVATED, SUGGEST PATIENT RISK ASSESSMENT BE USED TO DETERMINE APPROPRIATE ANTICOAGULANT THERAPY. Performed at [facility], [address], [place], [place] [address]   Blood Culture  (routine x 2)     Status: None   Collection Time: 07/29/23  1:41 PM   Specimen: BLOOD  Result Value Ref Range   Specimen Description BLOOD BLOOD RIGHT ARM    Special Requests      BOTTLES DRAWN AEROBIC AND ANAEROBIC Blood Culture results may not be optimal due to an inadequate volume of blood received in culture bottles   Culture      NO GROWTH 5 DAYS Performed at [facility], [address] [place], [place] [address]    Report Status 08/03/2023 FINAL   Blood Culture (routine x 2)     Status: None   Collection Time: 07/29/23  1:41 PM   Specimen: BLOOD  Result Value Ref Range   Specimen Description BLOOD BLOOD LEFT ARM    Special Requests      BOTTLES DRAWN AEROBIC AND ANAEROBIC Blood Culture results may not be optimal due to an inadequate volume of blood received in culture bottles   Culture      NO GROWTH 5 DAYS Performed at [facility], [address] [place], [place] [address]    Report Status 08/03/2023 FINAL   Lipase, blood     Status: None   Collection Time: 07/29/23  1:41 PM  Result Value Ref Range   Lipase 21 11 - 51 U/L    Comment: Performed at [facility], [address] [place], [place] [address]  Blood gas, venous     Status: Abnormal   Collection Time: 07/29/23  1:41 PM  Result Value Ref Range   pH, Ven 7.26 7.25 - 7.43   pCO2, Ven 48 44 - 60 mmHg   pO2, Ven 42 32 - 45 mmHg   Bicarbonate 21.5 20.0 - 28.0 mmol/L   Acid-base deficit 5.7 (H) 0.0 - 2.0 mmol/L   O2 Saturation 71.4 %   Patient temperature 37.0    Collection site VENOUS     Comment: Performed at [facility], [address], [place], [place] [address]  Troponin I (High Sensitivity)  Status: Abnormal   Collection Time: 07/29/23  1:41 PM  Result Value Ref Range   Troponin I (High Sensitivity) 357 (HH) <18 ng/L    Comment: CRITICAL RESULT CALLED TO, READ BACK BY AND VERIFIED WITH ABBY BOWMAN RN 1432 07/29/23 HNM (NOTE) Elevated high sensitivity  troponin I (hsTnI) values and significant  changes across serial measurements may suggest ACS but many other  chronic and acute conditions are known to elevate hsTnI results.  Refer to the "Links" section for chest pain algorithms and additional  guidance. Performed at [facility], [address] [place], [place] [address]   Brain natriuretic peptide     Status: Abnormal   Collection Time: 07/29/23  1:41 PM  Result Value Ref Range   B Natriuretic Peptide 425.1 (H) 0.0 - 100.0 pg/mL    Comment: Performed at [facility], [address] [place], [place] [address]  Procalcitonin     Status: None   Collection Time: 07/29/23  1:41 PM  Result Value Ref Range   Procalcitonin 0.19 ng/mL    Comment:        Interpretation: PCT (Procalcitonin) <= 0.5 ng/mL: Systemic infection (sepsis) is not likely. Local bacterial infection is possible. (NOTE)       Sepsis PCT Algorithm           Lower Respiratory Tract                                      Infection PCT Algorithm    ----------------------------     ----------------------------         PCT < 0.25 ng/mL                PCT < 0.10 ng/mL          Strongly encourage             Strongly discourage   discontinuation of antibiotics    initiation of antibiotics    ----------------------------     -----------------------------       PCT 0.25 - 0.50 ng/mL            PCT 0.10 - 0.25 ng/mL               OR       >80% decrease in PCT            Discourage initiation of                                            antibiotics      Encourage discontinuation           of antibiotics    ----------------------------     -----------------------------         PCT >= 0.50 ng/mL              PCT 0.26 - 0.50 ng/mL               AND        <80% decrease in PCT             Encourage initiation of  antibiotics       Encourage continuation           of antibiotics    ----------------------------      -----------------------------        PCT >= 0.50 ng/mL                  PCT > 0.50 ng/mL               AND         increase in PCT                  Strongly encourage                                      initiation of antibiotics    Strongly encourage escalation           of antibiotics                                     -----------------------------                                           PCT <= 0.25 ng/mL                                                 OR                                        > 80% decrease in PCT                                      Discontinue / Do not initiate                                             antibiotics  Performed at [facility], [address] [place], [place] [address]   Lactic acid, plasma     Status: Abnormal   Collection Time: 07/29/23  4:31 PM  Result Value Ref Range   Lactic Acid, Venous 2.7 (HH) 0.5 - 1.9 mmol/L    Comment: CRITICAL VALUE NOTED. VALUE IS CONSISTENT WITH PREVIOUSLY REPORTED/CALLED VALUE SS Performed at [facility], [address] [place], [place] [address]   Troponin I (High Sensitivity)     Status: Abnormal   Collection Time: 07/29/23  4:31 PM  Result Value Ref Range   Troponin I (High Sensitivity) 478 (HH) <18 ng/L    Comment: CRITICAL VALUE NOTED. VALUE IS CONSISTENT WITH PREVIOUSLY REPORTED/CALLED VALUE SS (NOTE) Elevated high sensitivity troponin I (hsTnI) values and significant  changes across serial measurements may suggest ACS but many other  chronic and acute conditions are known to elevate hsTnI results.  Refer to the "Links" section for chest pain algorithms and additional  guidance.  Performed at [facility], [address] [place], [place] [address]   Heparin level (unfractionated)     Status: Abnormal   Collection Time: 07/29/23  4:31 PM  Result Value Ref Range   Heparin Unfractionated >1.10 (H) 0.30 - 0.70 IU/mL    Comment: (NOTE) The clinical reportable range  upper limit is being lowered to >1.10 to align with the FDA approved guidance for the current laboratory assay.  If heparin results are below expected values, and patient dosage has  been confirmed, suggest follow up testing of antithrombin III levels. Performed at [facility], [address] [place], [place] [address]   TSH     Status: Abnormal   Collection Time: 07/29/23  4:31 PM  Result Value Ref Range   TSH 0.058 (L) 0.350 - 4.500 uIU/mL    Comment: Performed by a 3rd Generation assay with a functional sensitivity of <=0.01 uIU/mL. Performed at [facility], [address] [place], [place] [address]   Resp panel by RT-PCR (RSV, Flu A&B, Covid) Anterior Nasal Swab     Status: Abnormal   Collection Time: 07/29/23  4:33 PM   Specimen: Anterior Nasal Swab  Result Value Ref Range   SARS Coronavirus 2 by RT PCR NEGATIVE NEGATIVE    Comment: (NOTE) SARS-CoV-2 target nucleic acids are NOT DETECTED.  The SARS-CoV-2 RNA is generally detectable in upper respiratory specimens during the acute phase of infection. The lowest concentration of SARS-CoV-2 viral copies this assay can detect is 138 copies/mL. A negative result does not preclude SARS-Cov-2 infection and should not be used as the sole basis for treatment or other patient management decisions. A negative result may occur with  improper specimen collection/handling, submission of specimen other than nasopharyngeal swab, presence of viral mutation(s) within the areas targeted by this assay, and inadequate number of viral copies(<138 copies/mL). A negative result must be combined with clinical observations, patient history, and epidemiological information. The expected result is Negative.  Fact Sheet for Patients:  BloggerCourse.com  Fact Sheet for Healthcare Providers:  SeriousBroker.it  This test is no t yet approved or cleared by the [place] FDA  and  has been authorized for detection and/or diagnosis of SARS-CoV-2 by FDA under an Emergency Use Authorization (EUA). This EUA will remain  in effect (meaning this test can be used) for the duration of the COVID-19 declaration under Section 564(b)(1) of the Act, 21 U.S.C.section 360bbb-3(b)(1), unless the authorization is terminated  or revoked sooner.       Influenza A by PCR POSITIVE (A) NEGATIVE   Influenza B by PCR NEGATIVE NEGATIVE    Comment: (NOTE) The Xpert Xpress SARS-CoV-2/FLU/RSV plus assay is intended as an aid in the diagnosis of influenza from Nasopharyngeal swab specimens and should not be used as a sole basis for treatment. Nasal washings and aspirates are unacceptable for Xpert Xpress SARS-CoV-2/FLU/RSV testing.  Fact Sheet for Patients: BloggerCourse.com  Fact Sheet for Healthcare Providers: SeriousBroker.it  This test is not yet approved or cleared by the [place] FDA and has been authorized for detection and/or diagnosis of SARS-CoV-2 by FDA under an Emergency Use Authorization (EUA). This EUA will remain in effect (meaning this test can be used) for the duration of the COVID-19 declaration under Section 564(b)(1) of the Act, 21 U.S.C. section 360bbb-3(b)(1), unless the authorization is terminated or revoked.     Resp Syncytial Virus by PCR NEGATIVE NEGATIVE    Comment: (NOTE) Fact Sheet for Patients: BloggerCourse.com  Fact Sheet  for Healthcare Providers: SeriousBroker.it  This test is not yet approved or cleared by the [place] and has been authorized for detection and/or diagnosis of SARS-CoV-2 by FDA under an Emergency Use Authorization (EUA). This EUA will remain in effect (meaning this test can be used) for the duration of the COVID-19 declaration under Section 564(b)(1) of the Act, 21 U.S.C. section 360bbb-3(b)(1), unless the  authorization is terminated or revoked.  Performed at [facility], [address] [place], [place] [address]   CBG monitoring, ED     Status: Abnormal   Collection Time: 07/29/23  5:31 PM  Result Value Ref Range   Glucose-Capillary 271 (H) 70 - 99 mg/dL    Comment: Glucose reference range applies only to samples taken after fasting for at least 8 hours.  Lactic acid, plasma     Status: Abnormal   Collection Time: 07/29/23  7:59 PM  Result Value Ref Range   Lactic Acid, Venous 2.0 (HH) 0.5 - 1.9 mmol/L    Comment: CRITICAL VALUE NOTED. VALUE IS CONSISTENT WITH PREVIOUSLY REPORTED/CALLED VALUE SKL Performed at [facility], [address] [place], [place] [address]   Troponin I (High Sensitivity)     Status: Abnormal   Collection Time: 07/29/23  7:59 PM  Result Value Ref Range   Troponin I (High Sensitivity) 359 (HH) <18 ng/L    Comment: CRITICAL VALUE NOTED. VALUE IS CONSISTENT WITH PREVIOUSLY REPORTED/CALLED VALUE SKL (NOTE) Elevated high sensitivity troponin I (hsTnI) values and significant  changes across serial measurements may suggest ACS but many other  chronic and acute conditions are known to elevate hsTnI results.  Refer to the "Links" section for chest pain algorithms and additional  guidance. Performed at [facility], [address] [place], [place] [address]   Basic metabolic panel     Status: Abnormal   Collection Time: 07/29/23  7:59 PM  Result Value Ref Range   Sodium 130 (L) 135 - 145 mmol/L   Potassium 3.7 3.5 - 5.1 mmol/L   Chloride 98 98 - 111 mmol/L   CO2 17 (L) 22 - 32 mmol/L   Glucose, Bld 269 (H) 70 - 99 mg/dL    Comment: Glucose reference range applies only to samples taken after fasting for at least 8 hours.   BUN 40 (H) 8 - 23 mg/dL   Creatinine, Ser 9.62 (H) 0.44 - 1.00 mg/dL   Calcium 7.3 (L) 8.9 - 10.3 mg/dL   GFR, Estimated 16 (L) >60 mL/min    Comment: (NOTE) Calculated using the CKD-EPI Creatinine Equation  (2021)    Anion gap 15 5 - 15    Comment: Performed at [facility], [address] [place], [place] [address]  Blood gas, venous     Status: Abnormal   Collection Time: 07/29/23  8:01 PM  Result Value Ref Range   pH, Ven 7.23 (L) 7.25 - 7.43   pCO2, Ven 45 44 - 60 mmHg   pO2, Ven 66 (H) 32 - 45 mmHg   Bicarbonate 18.8 (L) 20.0 - 28.0 mmol/L   Acid-base deficit 8.6 (H) 0.0 - 2.0 mmol/L   O2 Saturation 92.3 %   Patient temperature 37.0    Collection site VEIN     Comment: Performed at [facility], [address] [place], [place] [address]  CBG monitoring, ED     Status: Abnormal   Collection Time: 07/29/23 11:00 PM  Result Value Ref Range   Glucose-Capillary 214 (H) 70 -  99 mg/dL    Comment: Glucose reference range applies only to samples taken after fasting for at least 8 hours.  CBC     Status: Abnormal   Collection Time: 07/30/23  2:01 AM  Result Value Ref Range   WBC 6.1 4.0 - 10.5 K/uL   RBC 3.42 (L) 3.87 - 5.11 MIL/uL   Hemoglobin 10.8 (L) 12.0 - 15.0 g/dL   HCT 16.1 (L) 09.6 - 04.5 %   MCV 96.8 80.0 - 100.0 fL   MCH 31.6 26.0 - 34.0 pg   MCHC 32.6 30.0 - 36.0 g/dL   RDW 40.9 81.1 - 91.4 %   Platelets 162 150 - 400 K/uL   nRBC 0.0 0.0 - 0.2 %    Comment: Performed at [facility], [address] [place], [place] [address]  Heparin level (unfractionated)     Status: Abnormal   Collection Time: 07/30/23  2:01 AM  Result Value Ref Range   Heparin Unfractionated >1.10 (H) 0.30 - 0.70 IU/mL    Comment: (NOTE) The clinical reportable range upper limit is being lowered to >1.10 to align with the FDA approved guidance for the current laboratory assay.  If heparin results are below expected values, and patient dosage has  been confirmed, suggest follow up testing of antithrombin III levels. Performed at [facility], [address] [place], [place] [address]   APTT     Status: Abnormal   Collection Time: 07/30/23  2:01 AM   Result Value Ref Range   aPTT 62 (H) 24 - 36 seconds    Comment:        IF BASELINE aPTT IS ELEVATED, SUGGEST PATIENT RISK ASSESSMENT BE USED TO DETERMINE APPROPRIATE ANTICOAGULANT THERAPY. Performed at [facility], [address] [place], [place] [address]   Comprehensive metabolic panel     Status: Abnormal   Collection Time: 07/30/23  2:01 AM  Result Value Ref Range   Sodium 129 (L) 135 - 145 mmol/L   Potassium 3.9 3.5 - 5.1 mmol/L   Chloride 98 98 - 111 mmol/L   CO2 19 (L) 22 - 32 mmol/L   Glucose, Bld 231 (H) 70 - 99 mg/dL    Comment: Glucose reference range applies only to samples taken after fasting for at least 8 hours.   BUN 43 (H) 8 - 23 mg/dL   Creatinine, Ser 4.69 (H) 0.44 - 1.00 mg/dL   Calcium 7.6 (L) 8.9 - 10.3 mg/dL   Total Protein 5.8 (L) 6.5 - 8.1 g/dL   Albumin 3.1 (L) 3.5 - 5.0 g/dL   AST 43 (H) 15 - 41 U/L   ALT 31 0 - 44 U/L   Alkaline Phosphatase 94 38 - 126 U/L   Total Bilirubin 0.5 0.0 - 1.2 mg/dL   GFR, Estimated 16 (L) >60 mL/min    Comment: (NOTE) Calculated using the CKD-EPI Creatinine Equation (2021)    Anion gap 12 5 - 15    Comment: Performed at [facility], [address] [place], [place] [address]  CBG monitoring, ED     Status: Abnormal   Collection Time: 07/30/23  8:16 AM  Result Value Ref Range   Glucose-Capillary 187 (H) 70 - 99 mg/dL    Comment: Glucose reference range applies only to samples taken after fasting for at least 8 hours.  CBG monitoring, ED     Status: Abnormal   Collection Time: 07/30/23 12:06 PM  Result Value Ref Range   Glucose-Capillary 231 (H)  70 - 99 mg/dL    Comment: Glucose reference range applies only to samples taken after fasting for at least 8 hours.  APTT     Status: Abnormal   Collection Time: 07/30/23  2:45 PM  Result Value Ref Range   aPTT 76 (H) 24 - 36 seconds    Comment:        IF BASELINE aPTT IS ELEVATED, SUGGEST PATIENT RISK ASSESSMENT BE USED TO DETERMINE  APPROPRIATE ANTICOAGULANT THERAPY. Performed at [facility], [address] [place], [place] [address]   CBG monitoring, ED     Status: Abnormal   Collection Time: 07/30/23  4:31 PM  Result Value Ref Range   Glucose-Capillary 240 (H) 70 - 99 mg/dL    Comment: Glucose reference range applies only to samples taken after fasting for at least 8 hours.  APTT     Status: Abnormal   Collection Time: 07/30/23 10:55 PM  Result Value Ref Range   aPTT 73 (H) 24 - 36 seconds    Comment:        IF BASELINE aPTT IS ELEVATED, SUGGEST PATIENT RISK ASSESSMENT BE USED TO DETERMINE APPROPRIATE ANTICOAGULANT THERAPY. Performed at [facility], [address] [place], [place] [address]   Heparin level (unfractionated)     Status: Abnormal   Collection Time: 07/31/23  5:36 AM  Result Value Ref Range   Heparin Unfractionated >1.10 (H) 0.30 - 0.70 IU/mL    Comment: (NOTE) The clinical reportable range upper limit is being lowered to >1.10 to align with the FDA approved guidance for the current laboratory assay.  If heparin results are below expected values, and patient dosage has  been confirmed, suggest follow up testing of antithrombin III levels. Performed at [facility], [address] [place], [place] [address]   CBC     Status: Abnormal   Collection Time: 07/31/23  5:36 AM  Result Value Ref Range   WBC 9.2 4.0 - 10.5 K/uL   RBC 3.44 (L) 3.87 - 5.11 MIL/uL   Hemoglobin 10.7 (L) 12.0 - 15.0 g/dL   HCT 91.4 (L) 78.2 - 95.6 %   MCV 92.7 80.0 - 100.0 fL   MCH 31.1 26.0 - 34.0 pg   MCHC 33.5 30.0 - 36.0 g/dL   RDW 21.3 08.6 - 57.8 %   Platelets 180 150 - 400 K/uL   nRBC 0.0 0.0 - 0.2 %    Comment: Performed at [facility], [address], [place], [place] [address]  Basic metabolic panel     Status: Abnormal   Collection Time: 07/31/23  5:36 AM  Result Value Ref Range   Sodium 138 135 - 145 mmol/L    Comment: REPEATED TO VERIFY   Potassium  3.9 3.5 - 5.1 mmol/L   Chloride 105 98 - 111 mmol/L   CO2 24 22 - 32 mmol/L   Glucose, Bld 132 (H) 70 - 99 mg/dL    Comment: Glucose reference range applies only to samples taken after fasting for at least 8 hours.   BUN 30 (H) 8 - 23 mg/dL   Creatinine, Ser 9.52 (H) 0.44 - 1.00 mg/dL    Comment: REPEATED TO VERIFY   Calcium 8.1 (L) 8.9 - 10.3 mg/dL   GFR, Estimated 57 (L) >60 mL/min    Comment: (NOTE) Calculated using the CKD-EPI Creatinine Equation (2021)    Anion gap 9 5 - 15    Comment: Performed at [facility], [address]  [place], [place] [address]  APTT     Status: Abnormal   Collection Time: 07/31/23  5:36 AM  Result Value Ref Range   aPTT 68 (H) 24 - 36 seconds    Comment:        IF BASELINE aPTT IS ELEVATED, SUGGEST PATIENT RISK ASSESSMENT BE USED TO DETERMINE APPROPRIATE ANTICOAGULANT THERAPY. Performed at [facility], [address] [place], [place] [address]   ECHOCARDIOGRAM COMPLETE     Status: None   Collection Time: 07/31/23  7:59 AM  Result Value Ref Range   Weight 3,440 oz   Height 64 in   BP 111/68 mmHg   Ao pk vel 1.66 m/s   AV Area VTI 1.84 cm2   AR max vel 2.07 cm2   AV Mean grad 6.0 mmHg   AV Peak grad 11.0 mmHg   S' Lateral 3.30 cm   AV Area mean vel 1.89 cm2   Area-P 1/2 3.31 cm2   Est EF 60 - 65%   Glucose, capillary     Status: Abnormal   Collection Time: 07/31/23  8:23 AM  Result Value Ref Range   Glucose-Capillary 104 (H) 70 - 99 mg/dL    Comment: Glucose reference range applies only to samples taken after fasting for at least 8 hours.  Glucose, capillary     Status: Abnormal   Collection Time: 07/31/23 11:47 AM  Result Value Ref Range   Glucose-Capillary 160 (H) 70 - 99 mg/dL    Comment: Glucose reference range applies only to samples taken after fasting for at least 8 hours.  Respiratory (~20 pathogens) panel by PCR     Status: Abnormal   Collection Time: 07/31/23  2:59 PM   Specimen: Nasopharyngeal Swab;  Respiratory  Result Value Ref Range   Adenovirus NOT DETECTED NOT DETECTED   Coronavirus 229E NOT DETECTED NOT DETECTED    Comment: (NOTE) The Coronavirus on the Respiratory Panel, DOES NOT test for the novel  Coronavirus (2019 nCoV)    Coronavirus HKU1 NOT DETECTED NOT DETECTED   Coronavirus NL63 NOT DETECTED NOT DETECTED   Coronavirus OC43 NOT DETECTED NOT DETECTED   Metapneumovirus NOT DETECTED NOT DETECTED   Rhinovirus / Enterovirus NOT DETECTED NOT DETECTED   Influenza A H3 DETECTED (A) NOT DETECTED   Influenza B NOT DETECTED NOT DETECTED   Parainfluenza Virus 1 NOT DETECTED NOT DETECTED   Parainfluenza Virus 2 NOT DETECTED NOT DETECTED   Parainfluenza Virus 3 NOT DETECTED NOT DETECTED   Parainfluenza Virus 4 NOT DETECTED NOT DETECTED   Respiratory Syncytial Virus NOT DETECTED NOT DETECTED   Bordetella pertussis NOT DETECTED NOT DETECTED   Bordetella Parapertussis NOT DETECTED NOT DETECTED   Chlamydophila pneumoniae NOT DETECTED NOT DETECTED   Mycoplasma pneumoniae NOT DETECTED NOT DETECTED    Comment: Performed at [facility], [address] [place], [place] [address]  Glucose, capillary     Status: Abnormal   Collection Time: 07/31/23  4:36 PM  Result Value Ref Range   Glucose-Capillary 209 (H) 70 - 99 mg/dL    Comment: Glucose reference range applies only to samples taken after fasting for at least 8 hours.  Glucose, capillary     Status: Abnormal   Collection Time: 07/31/23  8:55 PM  Result Value Ref Range   Glucose-Capillary 173 (H) 70 - 99 mg/dL    Comment: Glucose reference range applies only to samples taken after fasting for at least 8 hours.  Heparin level (unfractionated)  Status: Abnormal   Collection Time: 08/01/23  4:43 AM  Result Value Ref Range   Heparin Unfractionated 1.01 (H) 0.30 - 0.70 IU/mL    Comment: (NOTE) The clinical reportable range upper limit is being lowered to >1.10 to align with the FDA approved guidance for the current  laboratory assay.  If heparin results are below expected values, and patient dosage has  been confirmed, suggest follow up testing of antithrombin III levels. Performed at [facility], [address] [place], [place] [address]   APTT     Status: Abnormal   Collection Time: 08/01/23  4:43 AM  Result Value Ref Range   aPTT 46 (H) 24 - 36 seconds    Comment:        IF BASELINE aPTT IS ELEVATED, SUGGEST PATIENT RISK ASSESSMENT BE USED TO DETERMINE APPROPRIATE ANTICOAGULANT THERAPY. Performed at [facility], [address] [place], [place] [address]   Glucose, capillary     Status: Abnormal   Collection Time: 08/01/23  8:53 AM  Result Value Ref Range   Glucose-Capillary 168 (H) 70 - 99 mg/dL    Comment: Glucose reference range applies only to samples taken after fasting for at least 8 hours.  Glucose, capillary     Status: Abnormal   Collection Time: 08/01/23 12:04 PM  Result Value Ref Range   Glucose-Capillary 206 (H) 70 - 99 mg/dL    Comment: Glucose reference range applies only to samples taken after fasting for at least 8 hours.  Glucose, capillary     Status: Abnormal   Collection Time: 08/01/23  5:14 PM  Result Value Ref Range   Glucose-Capillary 240 (H) 70 - 99 mg/dL    Comment: Glucose reference range applies only to samples taken after fasting for at least 8 hours.  Glucose, capillary     Status: Abnormal   Collection Time: 08/01/23  9:34 PM  Result Value Ref Range   Glucose-Capillary 199 (H) 70 - 99 mg/dL    Comment: Glucose reference range applies only to samples taken after fasting for at least 8 hours.  CBC     Status: Abnormal   Collection Time: 08/02/23  4:16 AM  Result Value Ref Range   WBC 6.1 4.0 - 10.5 K/uL   RBC 3.71 (L) 3.87 - 5.11 MIL/uL   Hemoglobin 11.7 (L) 12.0 - 15.0 g/dL   HCT 09.8 (L) 11.9 - 14.7 %   MCV 94.9 80.0 - 100.0 fL   MCH 31.5 26.0 - 34.0 pg   MCHC 33.2 30.0 - 36.0 g/dL   RDW 82.9 56.2 - 13.0 %   Platelets 206  150 - 400 K/uL   nRBC 0.0 0.0 - 0.2 %    Comment: Performed at [facility], [address] [place], [place] [address]  Basic metabolic panel     Status: Abnormal   Collection Time: 08/02/23  4:16 AM  Result Value Ref Range   Sodium 138 135 - 145 mmol/L   Potassium 5.4 (H) 3.5 - 5.1 mmol/L   Chloride 104 98 - 111 mmol/L   CO2 27 22 - 32 mmol/L   Glucose, Bld 192 (H) 70 - 99 mg/dL    Comment: Glucose reference range applies only to samples taken after fasting for at least 8 hours.   BUN 18 8 - 23 mg/dL   Creatinine, Ser 4.69 0.44 - 1.00 mg/dL   Calcium 9.3 8.9 - 62.9 mg/dL   GFR, Estimated >52 >84 mL/min  Comment: (NOTE) Calculated using the CKD-EPI Creatinine Equation (2021)    Anion gap 7 5 - 15    Comment: Performed at [facility], [address] [place], [place] [address]  Procalcitonin     Status: None   Collection Time: 08/02/23  4:16 AM  Result Value Ref Range   Procalcitonin <0.10 ng/mL    Comment:        Interpretation: PCT (Procalcitonin) <= 0.5 ng/mL: Systemic infection (sepsis) is not likely. Local bacterial infection is possible. (NOTE)       Sepsis PCT Algorithm           Lower Respiratory Tract                                      Infection PCT Algorithm    ----------------------------     ----------------------------         PCT < 0.25 ng/mL                PCT < 0.10 ng/mL          Strongly encourage             Strongly discourage   discontinuation of antibiotics    initiation of antibiotics    ----------------------------     -----------------------------       PCT 0.25 - 0.50 ng/mL            PCT 0.10 - 0.25 ng/mL               OR       >80% decrease in PCT            Discourage initiation of                                            antibiotics      Encourage discontinuation           of antibiotics    ----------------------------     -----------------------------         PCT >= 0.50 ng/mL              PCT 0.26 - 0.50 ng/mL                AND        <80% decrease in PCT             Encourage initiation of                                             antibiotics       Encourage continuation           of antibiotics    ----------------------------     -----------------------------        PCT >= 0.50 ng/mL                  PCT > 0.50 ng/mL               AND         increase in PCT                  Strongly encourage  initiation of antibiotics    Strongly encourage escalation           of antibiotics                                     -----------------------------                                           PCT <= 0.25 ng/mL                                                 OR                                        > 80% decrease in PCT                                      Discontinue / Do not initiate                                             antibiotics  Performed at [facility], [address] [place], [place] [address]   Glucose, capillary     Status: Abnormal   Collection Time: 08/02/23  8:53 AM  Result Value Ref Range   Glucose-Capillary 165 (H) 70 - 99 mg/dL    Comment: Glucose reference range applies only to samples taken after fasting for at least 8 hours.  Glucose, capillary     Status: Abnormal   Collection Time: 08/02/23 11:50 AM  Result Value Ref Range   Glucose-Capillary 151 (H) 70 - 99 mg/dL    Comment: Glucose reference range applies only to samples taken after fasting for at least 8 hours.  Glucose, capillary     Status: Abnormal   Collection Time: 08/02/23  4:48 PM  Result Value Ref Range   Glucose-Capillary 201 (H) 70 - 99 mg/dL    Comment: Glucose reference range applies only to samples taken after fasting for at least 8 hours.  Glucose, capillary     Status: Abnormal   Collection Time: 08/02/23  8:37 PM  Result Value Ref Range   Glucose-Capillary 239 (H) 70 - 99 mg/dL    Comment: Glucose reference range applies only to samples taken  after fasting for at least 8 hours.  Glucose, capillary     Status: Abnormal   Collection Time: 08/02/23 11:09 PM  Result Value Ref Range   Glucose-Capillary 208 (H) 70 - 99 mg/dL    Comment: Glucose reference range applies only to samples taken after fasting for at least 8 hours.  Glucose, capillary     Status: Abnormal   Collection Time: 08/03/23  3:29 AM  Result Value Ref Range   Glucose-Capillary 192 (H) 70 - 99 mg/dL    Comment: Glucose reference range applies only to samples taken after fasting for at least 8 hours.  Glucose, capillary  Status: Abnormal   Collection Time: 08/03/23  8:49 AM  Result Value Ref Range   Glucose-Capillary 287 (H) 70 - 99 mg/dL    Comment: Glucose reference range applies only to samples taken after fasting for at least 8 hours.  Glucose, capillary     Status: None   Collection Time: 08/03/23 12:07 PM  Result Value Ref Range   Glucose-Capillary 94 70 - 99 mg/dL    Comment: Glucose reference range applies only to samples taken after fasting for at least 8 hours.  Glucose, capillary     Status: Abnormal   Collection Time: 08/03/23  5:04 PM  Result Value Ref Range   Glucose-Capillary 231 (H) 70 - 99 mg/dL    Comment: Glucose reference range applies only to samples taken after fasting for at least 8 hours.  Glucose, capillary     Status: Abnormal   Collection Time: 08/03/23  9:58 PM  Result Value Ref Range   Glucose-Capillary 229 (H) 70 - 99 mg/dL    Comment: Glucose reference range applies only to samples taken after fasting for at least 8 hours.  CBC     Status: Abnormal   Collection Time: 08/04/23  7:15 AM  Result Value Ref Range   WBC 9.9 4.0 - 10.5 K/uL   RBC 3.79 (L) 3.87 - 5.11 MIL/uL   Hemoglobin 11.6 (L) 12.0 - 15.0 g/dL   HCT 24.4 (L) 01.0 - 27.2 %   MCV 93.1 80.0 - 100.0 fL   MCH 30.6 26.0 - 34.0 pg   MCHC 32.9 30.0 - 36.0 g/dL   RDW 53.6 64.4 - 03.4 %   Platelets 303 150 - 400 K/uL   nRBC 0.0 0.0 - 0.2 %    Comment: Performed at  [facility], [address] [place], [place] [address]  Basic metabolic panel     Status: Abnormal   Collection Time: 08/04/23  7:15 AM  Result Value Ref Range   Sodium 134 (L) 135 - 145 mmol/L   Potassium 4.8 3.5 - 5.1 mmol/L   Chloride 97 (L) 98 - 111 mmol/L   CO2 27 22 - 32 mmol/L   Glucose, Bld 155 (H) 70 - 99 mg/dL    Comment: Glucose reference range applies only to samples taken after fasting for at least 8 hours.   BUN 17 8 - 23 mg/dL   Creatinine, Ser 5.63 0.44 - 1.00 mg/dL   Calcium 8.9 8.9 - 87.5 mg/dL   GFR, Estimated >64 >33 mL/min    Comment: (NOTE) Calculated using the CKD-EPI Creatinine Equation (2021)    Anion gap 10 5 - 15    Comment: Performed at [facility], [address] [place], [place] [address]  Glucose, capillary     Status: Abnormal   Collection Time: 08/04/23  8:42 AM  Result Value Ref Range   Glucose-Capillary 155 (H) 70 - 99 mg/dL    Comment: Glucose reference range applies only to samples taken after fasting for at least 8 hours.  Glucose, capillary     Status: None   Collection Time: 08/04/23 11:40 AM  Result Value Ref Range   Glucose-Capillary 94 70 - 99 mg/dL    Comment: Glucose reference range applies only to samples taken after fasting for at least 8 hours.  Glucose, capillary     Status: Abnormal   Collection Time: 08/04/23  4:50 PM  Result Value Ref Range   Glucose-Capillary 278 (H) 70 - 99 mg/dL    Comment:  Glucose reference range applies only to samples taken after fasting for at least 8 hours.  Glucose, capillary     Status: Abnormal   Collection Time: 08/04/23  4:53 PM  Result Value Ref Range   Glucose-Capillary 117 (H) 70 - 99 mg/dL    Comment: Glucose reference range applies only to samples taken after fasting for at least 8 hours.  Glucose, capillary     Status: Abnormal   Collection Time: 08/04/23  9:18 PM  Result Value Ref Range   Glucose-Capillary 239 (H) 70 - 99 mg/dL    Comment: Glucose reference range  applies only to samples taken after fasting for at least 8 hours.  Glucose, capillary     Status: Abnormal   Collection Time: 08/05/23  8:53 AM  Result Value Ref Range   Glucose-Capillary 133 (H) 70 - 99 mg/dL    Comment: Glucose reference range applies only to samples taken after fasting for at least 8 hours.  Glucose, capillary     Status: Abnormal   Collection Time: 08/05/23 12:38 PM  Result Value Ref Range   Glucose-Capillary 143 (H) 70 - 99 mg/dL    Comment: Glucose reference range applies only to samples taken after fasting for at least 8 hours.  Glucose, capillary     Status: Abnormal   Collection Time: 08/05/23  4:25 PM  Result Value Ref Range   Glucose-Capillary 271 (H) 70 - 99 mg/dL    Comment: Glucose reference range applies only to samples taken after fasting for at least 8 hours.  Glucose, capillary     Status: Abnormal   Collection Time: 08/05/23  8:57 PM  Result Value Ref Range   Glucose-Capillary 210 (H) 70 - 99 mg/dL    Comment: Glucose reference range applies only to samples taken after fasting for at least 8 hours.  Glucose, capillary     Status: None   Collection Time: 08/06/23 12:00 PM  Result Value Ref Range   Glucose-Capillary 97 70 - 99 mg/dL    Comment: Glucose reference range applies only to samples taken after fasting for at least 8 hours.  POCT CBG (Fasting - Glucose)     Status: Abnormal   Collection Time: 08/14/23  2:21 PM  Result Value Ref Range   Glucose Fasting, POC 237 (A) 70 - 99 mg/dL  [id number]     Status: Abnormal   Collection Time: 08/14/23  2:58 PM  Result Value Ref Range   Glucose 206 (H) 70 - 99 mg/dL   BUN 18 8 - 27 mg/dL   Creatinine, Ser 4.09 (H) 0.57 - 1.00 mg/dL   eGFR 52 (L) >81 XB/JYN/8.29   BUN/Creatinine Ratio 16 12 - 28   Sodium 138 134 - 144 mmol/L   Potassium 4.3 3.5 - 5.2 mmol/L   Chloride 99 96 - 106 mmol/L   CO2 25 20 - 29 mmol/L   Calcium 8.5 (L) 8.7 - 10.3 mg/dL   Total Protein 6.0 6.0 - 8.5 g/dL   Albumin 3.9 3.8  - 4.8 g/dL   Globulin, Total 2.1 1.5 - 4.5 g/dL   Bilirubin Total 0.7 0.0 - 1.2 mg/dL   Alkaline Phosphatase 106 44 - 121 IU/L   AST 11 0 - 40 IU/L   ALT 12 0 - 32 IU/L  CBC with Diff     Status: None   Collection Time: 08/14/23  2:58 PM  Result Value Ref Range   WBC 9.5 3.4 - 10.8 x10E3/uL   RBC 4.18 3.77 -  5.28 x10E6/uL   Hemoglobin 13.2 11.1 - 15.9 g/dL   Hematocrit 16.1 09.6 - 46.6 %   MCV 95 79 - 97 fL   MCH 31.6 26.6 - 33.0 pg   MCHC 33.2 31.5 - 35.7 g/dL   RDW 04.5 40.9 - 81.1 %   Platelets 312 150 - 450 x10E3/uL   Neutrophils 67 Not Estab. %   Lymphs 21 Not Estab. %   Monocytes 9 Not Estab. %   Eos 1 Not Estab. %   Basos 1 Not Estab. %   Neutrophils Absolute 6.4 1.4 - 7.0 x10E3/uL   Lymphocytes Absolute 2.0 0.7 - 3.1 x10E3/uL   Monocytes Absolute 0.9 0.1 - 0.9 x10E3/uL   EOS (ABSOLUTE) 0.1 0.0 - 0.4 x10E3/uL   Basophils Absolute 0.1 0.0 - 0.2 x10E3/uL   Immature Granulocytes 1 Not Estab. %   Immature Grans (Abs) 0.1 0.0 - 0.1 x10E3/uL  Magnesium     Status: Abnormal   Collection Time: 08/14/23  2:58 PM  Result Value Ref Range   Magnesium 1.1 (L) 1.6 - 2.3 mg/dL       Assessment & Plan:   Problem List Items Addressed This Visit       Cardiovascular and Mediastinum   Atrial fibrillation with rapid ventricular response [facility])   Patient is seen by Cardiology, who manage this condition.  She is well controlled with current therapy.   Will defer to them for further changes to plan of care.         Genitourinary   Acute kidney injury superimposed on CKD (HCC)   Rechecking CMP today.  Will call with results and send to nephrology if needed.  Reassess at follow up      Relevant Orders   CMP14+EGFR (Completed)     Other   Hyponatremia   Rechecking CMP today.  Will call with results when available and supplement as needed      Relevant Orders   CMP14+EGFR (Completed)   Hyperkalemia   Rechecking CMP today.  Will call with results when available and  supplement as needed      Relevant Orders   CMP14+EGFR (Completed)   Other Visit Diagnoses       Type 2 diabetes mellitus with chronic kidney disease, without long-term current use of insulin, unspecified CKD stage (HCC)    -  Primary   Relevant Medications   dapagliflozin propanediol (FARXIGA) 10 MG TABS tablet   Other Relevant Orders   POCT CBG (Fasting - Glucose) (Completed)     Pneumonia due to infectious organism, unspecified laterality, unspecified part of lung       Checking CBC to see if this has improved. Will call with results.   Relevant Medications   albuterol (PROVENTIL) (2.5 MG/3ML) 0.083% nebulizer solution   Other Relevant Orders   CBC with Diff (Completed)     Hypomagnesemia       Rechecking CMP today.  Will call with results when available and supplement as needed   Relevant Orders   Magnesium (Completed)       Return in about 4 months (around 12/12/2023).   Total time spent: 30 minutes  Miki Kins, FNP  08/14/2023   This document may have been prepared by [facility] Voice Recognition software and as such may include unintentional dictation errors.

## 2023-08-15 LAB — CBC WITH DIFFERENTIAL/PLATELET
Basophils Absolute: 0.1 10*3/uL (ref 0.0–0.2)
Basos: 1 %
EOS (ABSOLUTE): 0.1 10*3/uL (ref 0.0–0.4)
Eos: 1 %
Hematocrit: 39.7 % (ref 34.0–46.6)
Hemoglobin: 13.2 g/dL (ref 11.1–15.9)
Immature Grans (Abs): 0.1 10*3/uL (ref 0.0–0.1)
Immature Granulocytes: 1 %
Lymphocytes Absolute: 2 10*3/uL (ref 0.7–3.1)
Lymphs: 21 %
MCH: 31.6 pg (ref 26.6–33.0)
MCHC: 33.2 g/dL (ref 31.5–35.7)
MCV: 95 fL (ref 79–97)
Monocytes Absolute: 0.9 10*3/uL (ref 0.1–0.9)
Monocytes: 9 %
Neutrophils Absolute: 6.4 10*3/uL (ref 1.4–7.0)
Neutrophils: 67 %
Platelets: 312 10*3/uL (ref 150–450)
RBC: 4.18 x10E6/uL (ref 3.77–5.28)
RDW: 12.4 % (ref 11.7–15.4)
WBC: 9.5 10*3/uL (ref 3.4–10.8)

## 2023-08-15 LAB — MAGNESIUM: Magnesium: 1.1 mg/dL — ABNORMAL LOW (ref 1.6–2.3)

## 2023-08-15 LAB — CMP14+EGFR
ALT: 12 [IU]/L (ref 0–32)
AST: 11 [IU]/L (ref 0–40)
Albumin: 3.9 g/dL (ref 3.8–4.8)
Alkaline Phosphatase: 106 [IU]/L (ref 44–121)
BUN/Creatinine Ratio: 16 (ref 12–28)
BUN: 18 mg/dL (ref 8–27)
Bilirubin Total: 0.7 mg/dL (ref 0.0–1.2)
CO2: 25 mmol/L (ref 20–29)
Calcium: 8.5 mg/dL — ABNORMAL LOW (ref 8.7–10.3)
Chloride: 99 mmol/L (ref 96–106)
Creatinine, Ser: 1.11 mg/dL — ABNORMAL HIGH (ref 0.57–1.00)
Globulin, Total: 2.1 g/dL (ref 1.5–4.5)
Glucose: 206 mg/dL — ABNORMAL HIGH (ref 70–99)
Potassium: 4.3 mmol/L (ref 3.5–5.2)
Sodium: 138 mmol/L (ref 134–144)
Total Protein: 6 g/dL (ref 6.0–8.5)
eGFR: 52 mL/min/{1.73_m2} — ABNORMAL LOW (ref >59)

## 2023-08-25 ENCOUNTER — Telehealth: Payer: Self-pay | Admitting: Family

## 2023-08-25 ENCOUNTER — Other Ambulatory Visit: Payer: Self-pay | Admitting: Family

## 2023-08-25 NOTE — Telephone Encounter (Signed)
 Stephanie Small, PT with [facility], left VM to report missed PT visit this week and that patient would like to resume home health PT the week of 09/07/23 for 1w3. Okay to leave verbal orders on secure VM.  Left VM giving verbal orders.

## 2023-08-26 ENCOUNTER — Other Ambulatory Visit: Payer: Self-pay | Admitting: Family

## 2023-09-24 ENCOUNTER — Telehealth: Payer: Self-pay

## 2023-09-24 NOTE — Telephone Encounter (Signed)
 Patient daugther called stating that her GERD is still really bad and they want to try something that will help or something different, she states its so bad she's been gagging

## 2023-09-25 ENCOUNTER — Other Ambulatory Visit: Payer: Self-pay

## 2023-09-25 DIAGNOSIS — N289 Disorder of kidney and ureter, unspecified: Secondary | ICD-10-CM

## 2023-09-25 MED ORDER — VOQUEZNA 20 MG PO TABS
20.0000 mg | ORAL_TABLET | Freq: Every day | ORAL | 0 refills | Status: AC
Start: 2023-09-25 — End: ?

## 2023-09-25 NOTE — Telephone Encounter (Signed)
 Sent voquezna, patient daughter informed she can take both that and the omeprazole together for one week only

## 2023-09-28 ENCOUNTER — Ambulatory Visit (INDEPENDENT_AMBULATORY_CARE_PROVIDER_SITE_OTHER): Admitting: Cardiology

## 2023-09-28 ENCOUNTER — Encounter: Payer: Self-pay | Admitting: Cardiology

## 2023-09-28 VITALS — BP 126/74 | HR 93 | Ht 64.0 in | Wt 211.0 lb

## 2023-09-28 DIAGNOSIS — H6693 Otitis media, unspecified, bilateral: Secondary | ICD-10-CM | POA: Diagnosis not present

## 2023-09-28 DIAGNOSIS — H6690 Otitis media, unspecified, unspecified ear: Secondary | ICD-10-CM | POA: Insufficient documentation

## 2023-09-28 MED ORDER — CIPRO HC 0.2-1 % OT SUSP: 3.0000 [drp] | Freq: Two times a day (BID) | OTIC | 1 refills | Status: DC

## 2023-09-28 NOTE — Progress Notes (Signed)
 Established Patient Office Visit  Subjective:  Patient ID: Stephanie Small, female    DOB: January 24, 1946  Age: 78 y.o. MRN: [id number]  Chief Complaint  Patient presents with   Acute Visit    Bilateral Ear Pain/ Stuffiness    Patient in office for an acute visit, complaining of bilateral ear pain and "stuffiness" right worse than left. Symptoms started 3 days ago. Daughter states patient felt feverish, but did not measure her temperature. Inner ear erythematous on exam. Will send in antibiotic ear drops.   Ear Fullness  There is pain in both ears. This is a new problem. The current episode started in the past 7 days. The problem has been unchanged. There has been no fever. Associated symptoms include coughing. Pertinent negatives include no abdominal pain, diarrhea, headaches, rhinorrhea or sore throat. She has tried nothing for the symptoms. The treatment provided no relief.    No other concerns at this time.   Past Medical History:  Diagnosis Date   Arthritis    Atypical squamous cells of undetermined significance (ASCUS) on Papanicolaou smear of cervix 10/08/2016   Chest pain 09/20/2020   COPD (chronic obstructive pulmonary disease) (HCC)    Diabetes mellitus without complication (HCC)    Diarrhea 01/09/2021   Elevated troponin    Glaucoma 2010   History of adenomatous polyp of colon    Hypertension 2005   Hypomagnesemia 01/09/2021   Knee pain 05/06/2022   Low back pain of over 3 months duration 05/06/2022   Personal history of colonic polyps    Respiratory distress 10/25/2015   Sepsis (HCC) 01/09/2021   Thyroid cyst    UTI (urinary tract infection) 01/09/2021   Wheezing 10/25/2015    Past Surgical History:  Procedure Laterality Date   CAROTID STENT INSERTION  2011   CHOLECYSTECTOMY  2009   COLONOSCOPY  2007   COLONOSCOPY WITH PROPOFOL N/A 08/18/2016   Procedure: COLONOSCOPY WITH PROPOFOL;  Surgeon: Midge Minium, MD;  Location: [facility];  Service:  Endoscopy;  Laterality: N/A;   FOOT SURGERY     SHOULDER SURGERY Bilateral 2004   UPPER GI ENDOSCOPY      Social History   Socioeconomic History   Marital status: Widowed    Spouse name: Not on file   Number of children: Not on file   Years of education: Not on file   Highest education level: Not on file  Occupational History   Not on file  Tobacco Use   Smoking status: Every Day    Types: E-cigarettes   Smokeless tobacco: Never  Vaping Use   Vaping status: Never Used  Substance and Sexual Activity   Alcohol use: No   Drug use: No   Sexual activity: Not Currently    Birth control/protection: Post-menopausal  Other Topics Concern   Not on file  Social History Narrative   Not on file   Social Drivers of Health   Financial Resource Strain: Not on file  Food Insecurity: No Food Insecurity (07/31/2023)   Hunger Vital Sign    Worried About Running Out of Food in the Last Year: Never true    Ran Out of Food in the Last Year: Never true  Transportation Needs: No Transportation Needs (07/31/2023)   PRAPARE - Administrator, Civil Service (Medical): No    Lack of Transportation (Non-Medical): No  Physical Activity: Not on file  Stress: Not on file  Social Connections: Moderately Isolated (07/31/2023)   Social Connection and Isolation  Panel [NHANES]    Frequency of Communication with Friends and Family: More than three times a week    Frequency of Social Gatherings with Friends and Family: Once a week    Attends Religious Services: More than 4 times per year    Active Member of [employer] or Organizations: No    Attends Banker Meetings: Never    Marital Status: Widowed  Intimate Partner Violence: Not At Risk (07/31/2023)   Humiliation, Afraid, Rape, and Kick questionnaire    Fear of Current or Ex-Partner: No    Emotionally Abused: No    Physically Abused: No    Sexually Abused: No    Family History  Problem Relation Age of Onset   Hypertension  Mother     Allergies  Allergen Reactions   Shellfish-Derived Products Anaphylaxis and Rash    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   Shellfish Allergy Rash    Outpatient Medications Prior to Visit  Medication Sig   albuterol (PROVENTIL) (2.5 MG/3ML) 0.083% nebulizer solution Take 3 mLs (2.5 mg total) by nebulization every 4 (four) hours as needed for wheezing or shortness of breath.   amiodarone (PACERONE) 400 MG tablet Take 1 tablet (400 mg total) by mouth daily.   apixaban (ELIQUIS) 5 MG TABS tablet TAKE 1 TABLET BY MOUTH TWICE DAILY   citalopram (CELEXA) 20 MG tablet Take 20 mg by mouth daily.   cyclobenzaprine (FLEXERIL) 5 MG tablet Take 5 mg by mouth 3 (three) times daily as needed for muscle spasms.   dapagliflozin propanediol (FARXIGA) 10 MG TABS tablet Take 1 tablet (10 mg total) by mouth daily.   diclofenac Sodium (VOLTAREN) 1 % GEL Apply 1 Application topically 4 (four) times daily.   diltiazem (CARDIZEM CD) 360 MG 24 hr capsule Take 1 capsule (360 mg total) by mouth daily. (Patient not taking: Reported on 08/14/2023)   fentaNYL (DURAGESIC) 75 MCG/HR Place 1 patch onto the skin every 3 (three) days.   fluticasone (FLONASE) 50 MCG/ACT nasal spray Place 2 sprays into both nostrils daily.   fluticasone furoate-vilanterol (BREO ELLIPTA) 100-25 MCG/INH AEPB Inhale 1 puff into the lungs daily as needed. (Patient not taking: Reported on 08/14/2023)   furosemide (LASIX) 20 MG tablet TAKE 1 TABLET BY MOUTH ONCE DAILY   gabapentin (NEURONTIN) 400 MG capsule Take 400 mg by mouth 3 (three) times daily.   glucose blood (ONETOUCH VERIO) test strip Use as instructed   Ipratropium-Albuterol (COMBIVENT) 20-100 MCG/ACT AERS respimat Inhale 1 puff into the lungs 3 (three) times daily. (Patient not taking: Reported on 08/14/2023)   isosorbide mononitrate (IMDUR) 30 MG 24 hr tablet TAKE 1 TABLET BY MOUTH ONCE DAILY    levothyroxine (SYNTHROID) 50 MCG tablet Take 1 tablet (50 mcg total) by mouth daily at 6 (six) AM.   lidocaine (LIDODERM) 5 % Place 1 patch onto the skin daily as needed. Pt has not been using as often due to other meds, only using PRN (Patient not taking: Reported on 08/14/2023)   losartan (COZAAR) 50 MG tablet TAKE 1 TABLET BY MOUTH ONCE DAILY   naloxone (NARCAN) nasal spray 4 mg/0.1 mL Place 1 spray into the nose once. (Patient not taking: Reported on 08/14/2023)   nystatin (MYCOSTATIN) 100000 UNIT/ML suspension Take 5 mLs (500,000 Units total) by mouth 4 (four) times daily.   omeprazole (PRILOSEC) 40 MG capsule TAKE 1 CAPSULE BY MOUTH ONCE DAILY  ondansetron (ZOFRAN) 4 MG tablet Take 4 mg by mouth every 8 (eight) hours as needed.   Oxycodone HCl 10 MG TABS Take 10 mg by mouth daily.   rifaximin (XIFAXAN) 550 MG TABS tablet TAKE 1 TABLET BY MOUTH TWICE DAILY   rosuvastatin (CRESTOR) 20 MG tablet TAKE 1 TABLET BY MOUTH ONCE EVERY EVENING   sitaGLIPtin (JANUVIA) 100 MG tablet TAKE 1 TABLET BY MOUTH ONCE DAILY   SUMAtriptan (IMITREX) 100 MG tablet TAKE 1 TABLET BY MOUTH AS NEEDED FOR HEADACHE. MAX 2 TABLETS PER DAY   tolterodine (DETROL LA) 2 MG 24 hr capsule TAKE 1 CAPSULE BY MOUTH ONCE DAILY   vitamin C (ASCORBIC ACID) 500 MG tablet Take 500 mg by mouth daily. (Patient not taking: Reported on 08/14/2023)   Vonoprazan Fumarate (VOQUEZNA) 20 MG TABS Take 20 mg by mouth daily.   No facility-administered medications prior to visit.    Review of Systems  Constitutional: Negative.   HENT:  Positive for ear pain. Negative for rhinorrhea and sore throat.   Eyes: Negative.   Respiratory:  Positive for cough. Negative for shortness of breath.   Cardiovascular: Negative.  Negative for chest pain.  Gastrointestinal: Negative.  Negative for abdominal pain, constipation and diarrhea.  Genitourinary: Negative.   Musculoskeletal:  Negative for joint pain and myalgias.  Skin: Negative.   Neurological:  Negative.  Negative for dizziness and headaches.  Endo/Heme/Allergies: Negative.   All other systems reviewed and are negative.      Objective:   BP 126/74   Pulse 93   Ht 5\' 4"  (1.626 m)   Wt 211 lb (95.7 kg)   SpO2 96%   PF (!) 2 L/min   BMI 36.22 kg/m   Vitals:   09/28/23 1301  BP: 126/74  Pulse: 93  Height: 5\' 4"  (1.626 m)  Weight: 211 lb (95.7 kg)  SpO2: 96%  PF: (!) 2 L/min  BMI (Calculated): 36.2    Physical Exam Vitals and nursing note reviewed.  Constitutional:      Appearance: Normal appearance. She is normal weight.  HENT:     Head: Normocephalic and atraumatic.     Right Ear: Tenderness present. There is no impacted cerumen. Tympanic membrane is erythematous.     Left Ear: Tenderness present. There is no impacted cerumen. Tympanic membrane is erythematous.     Nose: Nose normal.     Mouth/Throat:     Mouth: Mucous membranes are moist.  Eyes:     Extraocular Movements: Extraocular movements intact.     Conjunctiva/sclera: Conjunctivae normal.     Pupils: Pupils are equal, round, and reactive to light.  Cardiovascular:     Rate and Rhythm: Normal rate and regular rhythm.     Pulses: Normal pulses.     Heart sounds: Normal heart sounds.  Pulmonary:     Effort: Pulmonary effort is normal.     Breath sounds: Normal breath sounds.  Abdominal:     General: Abdomen is flat. Bowel sounds are normal.     Palpations: Abdomen is soft.  Musculoskeletal:        General: Normal range of motion.     Cervical back: Normal range of motion.  Skin:    General: Skin is warm and dry.  Neurological:     General: No focal deficit present.     Mental Status: She is alert and oriented to person, place, and time.  Psychiatric:        Mood and Affect: Mood normal.  Behavior: Behavior normal.        Thought Content: Thought content normal.        Judgment: Judgment normal.      No results found for any visits on 09/28/23.  Recent Results (from the past  2160 hours)  Lactic acid, plasma     Status: Abnormal   Collection Time: 07/29/23  1:41 PM  Result Value Ref Range   Lactic Acid, Venous 2.0 (HH) 0.5 - 1.9 mmol/L    Comment: CRITICAL RESULT CALLED TO, READ BACK BY AND VERIFIED WITH ABBY BOWMAN RN 1422 07/29/23 HNM Performed at [facility], [address] [place], [place] [address]   Comprehensive metabolic panel     Status: Abnormal   Collection Time: 07/29/23  1:41 PM  Result Value Ref Range   Sodium 129 (L) 135 - 145 mmol/L   Potassium 3.9 3.5 - 5.1 mmol/L   Chloride 96 (L) 98 - 111 mmol/L   CO2 18 (L) 22 - 32 mmol/L   Glucose, Bld 241 (H) 70 - 99 mg/dL    Comment: Glucose reference range applies only to samples taken after fasting for at least 8 hours.   BUN 37 (H) 8 - 23 mg/dL   Creatinine, Ser 9.62 (H) 0.44 - 1.00 mg/dL   Calcium 8.0 (L) 8.9 - 10.3 mg/dL   Total Protein 6.1 (L) 6.5 - 8.1 g/dL   Albumin 3.4 (L) 3.5 - 5.0 g/dL   AST 50 (H) 15 - 41 U/L   ALT 35 0 - 44 U/L   Alkaline Phosphatase 106 38 - 126 U/L   Total Bilirubin 0.6 0.0 - 1.2 mg/dL   GFR, Estimated 18 (L) >60 mL/min    Comment: (NOTE) Calculated using the CKD-EPI Creatinine Equation (2021)    Anion gap 15 5 - 15    Comment: Performed at [facility], [address] [place], [place] [address]  CBC with Differential     Status: Abnormal   Collection Time: 07/29/23  1:41 PM  Result Value Ref Range   WBC 6.7 4.0 - 10.5 K/uL   RBC 3.82 (L) 3.87 - 5.11 MIL/uL   Hemoglobin 12.0 12.0 - 15.0 g/dL   HCT 13.2 44.0 - 10.2 %   MCV 95.5 80.0 - 100.0 fL   MCH 31.4 26.0 - 34.0 pg   MCHC 32.9 30.0 - 36.0 g/dL   RDW 72.5 36.6 - 44.0 %   Platelets 186 150 - 400 K/uL   nRBC 0.0 0.0 - 0.2 %   Neutrophils Relative % 77 %   Neutro Abs 5.1 1.7 - 7.7 K/uL   Lymphocytes Relative 14 %   Lymphs Abs 1.0 0.7 - 4.0 K/uL   Monocytes Relative 9 %   Monocytes Absolute 0.6 0.1 - 1.0 K/uL   Eosinophils Relative 0 %   Eosinophils Absolute 0.0 0.0 - 0.5 K/uL    Basophils Relative 0 %   Basophils Absolute 0.0 0.0 - 0.1 K/uL   Immature Granulocytes 0 %   Abs Immature Granulocytes 0.02 0.00 - 0.07 K/uL    Comment: Performed at [facility], [address] [place], [place] [address]  Protime-INR     Status: Abnormal   Collection Time: 07/29/23  1:41 PM  Result Value Ref Range   Prothrombin Time 20.8 (H) 11.4 - 15.2 seconds   INR 1.8 (H) 0.8 - 1.2    Comment: (NOTE) INR goal varies based on device and disease states. Performed at [facility], [phone]  [address] [place], [place] [address]   APTT     Status: Abnormal   Collection Time: 07/29/23  1:41 PM  Result Value Ref Range   aPTT 47 (H) 24 - 36 seconds    Comment:        IF BASELINE aPTT IS ELEVATED, SUGGEST PATIENT RISK ASSESSMENT BE USED TO DETERMINE APPROPRIATE ANTICOAGULANT THERAPY. Performed at [facility], [address], [place], [place] [address]   Blood Culture (routine x 2)     Status: None   Collection Time: 07/29/23  1:41 PM   Specimen: BLOOD  Result Value Ref Range   Specimen Description BLOOD BLOOD RIGHT ARM    Special Requests      BOTTLES DRAWN AEROBIC AND ANAEROBIC Blood Culture results may not be optimal due to an inadequate volume of blood received in culture bottles   Culture      NO GROWTH 5 DAYS Performed at [facility], [address], [place], [place] [address]    Report Status 08/03/2023 FINAL   Blood Culture (routine x 2)     Status: None   Collection Time: 07/29/23  1:41 PM   Specimen: BLOOD  Result Value Ref Range   Specimen Description BLOOD BLOOD LEFT ARM    Special Requests      BOTTLES DRAWN AEROBIC AND ANAEROBIC Blood Culture results may not be optimal due to an inadequate volume of blood received in culture bottles   Culture      NO GROWTH 5 DAYS Performed at [facility], [address], [place], [place] [address]    Report Status 08/03/2023 FINAL   Lipase, blood     Status: None    Collection Time: 07/29/23  1:41 PM  Result Value Ref Range   Lipase 21 11 - 51 U/L    Comment: Performed at [facility], [address] [place], [place] [address]  Blood gas, venous     Status: Abnormal   Collection Time: 07/29/23  1:41 PM  Result Value Ref Range   pH, Ven 7.26 7.25 - 7.43   pCO2, Ven 48 44 - 60 mmHg   pO2, Ven 42 32 - 45 mmHg   Bicarbonate 21.5 20.0 - 28.0 mmol/L   Acid-base deficit 5.7 (H) 0.0 - 2.0 mmol/L   O2 Saturation 71.4 %   Patient temperature 37.0    Collection site VENOUS     Comment: Performed at [facility], [address] [place], [place] [address]  Troponin I (High Sensitivity)     Status: Abnormal   Collection Time: 07/29/23  1:41 PM  Result Value Ref Range   Troponin I (High Sensitivity) 357 (HH) <18 ng/L    Comment: CRITICAL RESULT CALLED TO, READ BACK BY AND VERIFIED WITH ABBY BOWMAN RN 1432 07/29/23 HNM (NOTE) Elevated high sensitivity troponin I (hsTnI) values and significant  changes across serial measurements may suggest ACS but many other  chronic and acute conditions are known to elevate hsTnI results.  Refer to the "Links" section for chest pain algorithms and additional  guidance. Performed at [facility], [address] [place], [place] [address]   Brain natriuretic peptide     Status: Abnormal   Collection Time: 07/29/23  1:41 PM  Result Value Ref Range   B Natriuretic Peptide 425.1 (H) 0.0 - 100.0 pg/mL    Comment: Performed at [facility], [address], [place], [place] [address]  Procalcitonin     Status: None  Collection Time: 07/29/23  1:41 PM  Result Value Ref Range   Procalcitonin 0.19 ng/mL    Comment:        Interpretation: PCT (Procalcitonin) <= 0.5 ng/mL: Systemic infection (sepsis) is not likely. Local bacterial infection is possible. (NOTE)       Sepsis PCT Algorithm           Lower Respiratory Tract                                      Infection PCT Algorithm     ----------------------------     ----------------------------         PCT < 0.25 ng/mL                PCT < 0.10 ng/mL          Strongly encourage             Strongly discourage   discontinuation of antibiotics    initiation of antibiotics    ----------------------------     -----------------------------       PCT 0.25 - 0.50 ng/mL            PCT 0.10 - 0.25 ng/mL               OR       >80% decrease in PCT            Discourage initiation of                                            antibiotics      Encourage discontinuation           of antibiotics    ----------------------------     -----------------------------         PCT >= 0.50 ng/mL              PCT 0.26 - 0.50 ng/mL               AND        <80% decrease in PCT             Encourage initiation of                                             antibiotics       Encourage continuation           of antibiotics    ----------------------------     -----------------------------        PCT >= 0.50 ng/mL                  PCT > 0.50 ng/mL               AND         increase in PCT                  Strongly encourage                                      initiation of antibiotics    Strongly encourage escalation  of antibiotics                                     -----------------------------                                           PCT <= 0.25 ng/mL                                                 OR                                        > 80% decrease in PCT                                      Discontinue / Do not initiate                                             antibiotics  Performed at [facility], [address] [place], [place] [address]   Lactic acid, plasma     Status: Abnormal   Collection Time: 07/29/23  4:31 PM  Result Value Ref Range   Lactic Acid, Venous 2.7 (HH) 0.5 - 1.9 mmol/L    Comment: CRITICAL VALUE NOTED. VALUE IS CONSISTENT WITH PREVIOUSLY REPORTED/CALLED VALUE SS Performed at  [facility], [address] [place], [place] [address]   Troponin I (High Sensitivity)     Status: Abnormal   Collection Time: 07/29/23  4:31 PM  Result Value Ref Range   Troponin I (High Sensitivity) 478 (HH) <18 ng/L    Comment: CRITICAL VALUE NOTED. VALUE IS CONSISTENT WITH PREVIOUSLY REPORTED/CALLED VALUE SS (NOTE) Elevated high sensitivity troponin I (hsTnI) values and significant  changes across serial measurements may suggest ACS but many other  chronic and acute conditions are known to elevate hsTnI results.  Refer to the "Links" section for chest pain algorithms and additional  guidance. Performed at [facility], [address] [place], [place] [address]   Heparin level (unfractionated)     Status: Abnormal   Collection Time: 07/29/23  4:31 PM  Result Value Ref Range   Heparin Unfractionated >1.10 (H) 0.30 - 0.70 IU/mL    Comment: (NOTE) The clinical reportable range upper limit is being lowered to >1.10 to align with the FDA approved guidance for the current laboratory assay.  If heparin results are below expected values, and patient dosage has  been confirmed, suggest follow up testing of antithrombin III levels. Performed at [facility], [address] [place], [place] [address]   TSH     Status: Abnormal   Collection Time: 07/29/23  4:31 PM  Result Value Ref Range   TSH 0.058 (L) 0.350 - 4.500 uIU/mL    Comment: Performed by a 3rd Generation assay with a functional sensitivity of <=0.01 uIU/mL. Performed at [facility], [phone]  [address] [place], [place] [address]   Resp panel by RT-PCR (RSV, Flu A&B, Covid) Anterior Nasal Swab     Status: Abnormal   Collection Time: 07/29/23  4:33 PM   Specimen: Anterior Nasal Swab  Result Value Ref Range   SARS Coronavirus 2 by RT PCR NEGATIVE NEGATIVE    Comment: (NOTE) SARS-CoV-2 target nucleic acids are NOT DETECTED.  The SARS-CoV-2 RNA is generally detectable in upper  respiratory specimens during the acute phase of infection. The lowest concentration of SARS-CoV-2 viral copies this assay can detect is 138 copies/mL. A negative result does not preclude SARS-Cov-2 infection and should not be used as the sole basis for treatment or other patient management decisions. A negative result may occur with  improper specimen collection/handling, submission of specimen other than nasopharyngeal swab, presence of viral mutation(s) within the areas targeted by this assay, and inadequate number of viral copies(<138 copies/mL). A negative result must be combined with clinical observations, patient history, and epidemiological information. The expected result is Negative.  Fact Sheet for Patients:  BloggerCourse.com  Fact Sheet for Healthcare Providers:  SeriousBroker.it  This test is no t yet approved or cleared by the [place] FDA and  has been authorized for detection and/or diagnosis of SARS-CoV-2 by FDA under an Emergency Use Authorization (EUA). This EUA will remain  in effect (meaning this test can be used) for the duration of the COVID-19 declaration under Section 564(b)(1) of the Act, 21 U.S.C.section 360bbb-3(b)(1), unless the authorization is terminated  or revoked sooner.       Influenza A by PCR POSITIVE (A) NEGATIVE   Influenza B by PCR NEGATIVE NEGATIVE    Comment: (NOTE) The Xpert Xpress SARS-CoV-2/FLU/RSV plus assay is intended as an aid in the diagnosis of influenza from Nasopharyngeal swab specimens and should not be used as a sole basis for treatment. Nasal washings and aspirates are unacceptable for Xpert Xpress SARS-CoV-2/FLU/RSV testing.  Fact Sheet for Patients: BloggerCourse.com  Fact Sheet for Healthcare Providers: SeriousBroker.it  This test is not yet approved or cleared by the [place] FDA and has been authorized for  detection and/or diagnosis of SARS-CoV-2 by FDA under an Emergency Use Authorization (EUA). This EUA will remain in effect (meaning this test can be used) for the duration of the COVID-19 declaration under Section 564(b)(1) of the Act, 21 U.S.C. section 360bbb-3(b)(1), unless the authorization is terminated or revoked.     Resp Syncytial Virus by PCR NEGATIVE NEGATIVE    Comment: (NOTE) Fact Sheet for Patients: BloggerCourse.com  Fact Sheet for Healthcare Providers: SeriousBroker.it  This test is not yet approved or cleared by the [place] FDA and has been authorized for detection and/or diagnosis of SARS-CoV-2 by FDA under an Emergency Use Authorization (EUA). This EUA will remain in effect (meaning this test can be used) for the duration of the COVID-19 declaration under Section 564(b)(1) of the Act, 21 U.S.C. section 360bbb-3(b)(1), unless the authorization is terminated or revoked.  Performed at [facility], [address] [place], [place] [address]   CBG monitoring, ED     Status: Abnormal   Collection Time: 07/29/23  5:31 PM  Result Value Ref Range   Glucose-Capillary 271 (H) 70 - 99 mg/dL    Comment: Glucose reference range applies only to samples taken after fasting for at least 8 hours.  Lactic acid, plasma     Status: Abnormal   Collection Time: 07/29/23  7:59 PM  Result Value Ref Range   Lactic  Acid, Venous 2.0 (HH) 0.5 - 1.9 mmol/L    Comment: CRITICAL VALUE NOTED. VALUE IS CONSISTENT WITH PREVIOUSLY REPORTED/CALLED VALUE SKL Performed at [facility], [address] [place], [place] [address]   Troponin I (High Sensitivity)     Status: Abnormal   Collection Time: 07/29/23  7:59 PM  Result Value Ref Range   Troponin I (High Sensitivity) 359 (HH) <18 ng/L    Comment: CRITICAL VALUE NOTED. VALUE IS CONSISTENT WITH PREVIOUSLY REPORTED/CALLED VALUE SKL (NOTE) Elevated high sensitivity  troponin I (hsTnI) values and significant  changes across serial measurements may suggest ACS but many other  chronic and acute conditions are known to elevate hsTnI results.  Refer to the "Links" section for chest pain algorithms and additional  guidance. Performed at [facility], [address] [place], [place] [address]   Basic metabolic panel     Status: Abnormal   Collection Time: 07/29/23  7:59 PM  Result Value Ref Range   Sodium 130 (L) 135 - 145 mmol/L   Potassium 3.7 3.5 - 5.1 mmol/L   Chloride 98 98 - 111 mmol/L   CO2 17 (L) 22 - 32 mmol/L   Glucose, Bld 269 (H) 70 - 99 mg/dL    Comment: Glucose reference range applies only to samples taken after fasting for at least 8 hours.   BUN 40 (H) 8 - 23 mg/dL   Creatinine, Ser 7.82 (H) 0.44 - 1.00 mg/dL   Calcium 7.3 (L) 8.9 - 10.3 mg/dL   GFR, Estimated 16 (L) >60 mL/min    Comment: (NOTE) Calculated using the CKD-EPI Creatinine Equation (2021)    Anion gap 15 5 - 15    Comment: Performed at [facility], [address] [place], [place] [address]  Blood gas, venous     Status: Abnormal   Collection Time: 07/29/23  8:01 PM  Result Value Ref Range   pH, Ven 7.23 (L) 7.25 - 7.43   pCO2, Ven 45 44 - 60 mmHg   pO2, Ven 66 (H) 32 - 45 mmHg   Bicarbonate 18.8 (L) 20.0 - 28.0 mmol/L   Acid-base deficit 8.6 (H) 0.0 - 2.0 mmol/L   O2 Saturation 92.3 %   Patient temperature 37.0    Collection site VEIN     Comment: Performed at [facility], [address] [place], [place] [address]  CBG monitoring, ED     Status: Abnormal   Collection Time: 07/29/23 11:00 PM  Result Value Ref Range   Glucose-Capillary 214 (H) 70 - 99 mg/dL    Comment: Glucose reference range applies only to samples taken after fasting for at least 8 hours.  CBC     Status: Abnormal   Collection Time: 07/30/23  2:01 AM  Result Value Ref Range   WBC 6.1 4.0 - 10.5 K/uL   RBC 3.42 (L) 3.87 - 5.11 MIL/uL   Hemoglobin 10.8 (L)  12.0 - 15.0 g/dL   HCT 78.4 (L) 69.6 - 29.5 %   MCV 96.8 80.0 - 100.0 fL   MCH 31.6 26.0 - 34.0 pg   MCHC 32.6 30.0 - 36.0 g/dL   RDW 28.4 13.2 - 44.0 %   Platelets 162 150 - 400 K/uL   nRBC 0.0 0.0 - 0.2 %    Comment: Performed at [facility], [address] [place], [place] [address]  Heparin level (unfractionated)     Status: Abnormal   Collection Time: 07/30/23  2:01 AM  Result Value  Ref Range   Heparin Unfractionated >1.10 (H) 0.30 - 0.70 IU/mL    Comment: (NOTE) The clinical reportable range upper limit is being lowered to >1.10 to align with the FDA approved guidance for the current laboratory assay.  If heparin results are below expected values, and patient dosage has  been confirmed, suggest follow up testing of antithrombin III levels. Performed at [facility], [address] [place], [place] [address]   APTT     Status: Abnormal   Collection Time: 07/30/23  2:01 AM  Result Value Ref Range   aPTT 62 (H) 24 - 36 seconds    Comment:        IF BASELINE aPTT IS ELEVATED, SUGGEST PATIENT RISK ASSESSMENT BE USED TO DETERMINE APPROPRIATE ANTICOAGULANT THERAPY. Performed at [facility], [address] [place], [place] [address]   Comprehensive metabolic panel     Status: Abnormal   Collection Time: 07/30/23  2:01 AM  Result Value Ref Range   Sodium 129 (L) 135 - 145 mmol/L   Potassium 3.9 3.5 - 5.1 mmol/L   Chloride 98 98 - 111 mmol/L   CO2 19 (L) 22 - 32 mmol/L   Glucose, Bld 231 (H) 70 - 99 mg/dL    Comment: Glucose reference range applies only to samples taken after fasting for at least 8 hours.   BUN 43 (H) 8 - 23 mg/dL   Creatinine, Ser 8.29 (H) 0.44 - 1.00 mg/dL   Calcium 7.6 (L) 8.9 - 10.3 mg/dL   Total Protein 5.8 (L) 6.5 - 8.1 g/dL   Albumin 3.1 (L) 3.5 - 5.0 g/dL   AST 43 (H) 15 - 41 U/L   ALT 31 0 - 44 U/L   Alkaline Phosphatase 94 38 - 126 U/L   Total Bilirubin 0.5 0.0 - 1.2 mg/dL   GFR, Estimated 16 (L) >60 mL/min     Comment: (NOTE) Calculated using the CKD-EPI Creatinine Equation (2021)    Anion gap 12 5 - 15    Comment: Performed at [facility], [address] [place], [place] [address]  CBG monitoring, ED     Status: Abnormal   Collection Time: 07/30/23  8:16 AM  Result Value Ref Range   Glucose-Capillary 187 (H) 70 - 99 mg/dL    Comment: Glucose reference range applies only to samples taken after fasting for at least 8 hours.  CBG monitoring, ED     Status: Abnormal   Collection Time: 07/30/23 12:06 PM  Result Value Ref Range   Glucose-Capillary 231 (H) 70 - 99 mg/dL    Comment: Glucose reference range applies only to samples taken after fasting for at least 8 hours.  APTT     Status: Abnormal   Collection Time: 07/30/23  2:45 PM  Result Value Ref Range   aPTT 76 (H) 24 - 36 seconds    Comment:        IF BASELINE aPTT IS ELEVATED, SUGGEST PATIENT RISK ASSESSMENT BE USED TO DETERMINE APPROPRIATE ANTICOAGULANT THERAPY. Performed at [facility], [address] [place], [place] [address]   CBG monitoring, ED     Status: Abnormal   Collection Time: 07/30/23  4:31 PM  Result Value Ref Range   Glucose-Capillary 240 (H) 70 - 99 mg/dL    Comment: Glucose reference range applies only to samples taken after fasting for at least 8 hours.  APTT     Status: Abnormal   Collection Time: 07/30/23 10:55 PM  Result Value  Ref Range   aPTT 73 (H) 24 - 36 seconds    Comment:        IF BASELINE aPTT IS ELEVATED, SUGGEST PATIENT RISK ASSESSMENT BE USED TO DETERMINE APPROPRIATE ANTICOAGULANT THERAPY. Performed at [facility], [address] [place], [place] [address]   Heparin level (unfractionated)     Status: Abnormal   Collection Time: 07/31/23  5:36 AM  Result Value Ref Range   Heparin Unfractionated >1.10 (H) 0.30 - 0.70 IU/mL    Comment: (NOTE) The clinical reportable range upper limit is being lowered to >1.10 to align with the FDA approved guidance for  the current laboratory assay.  If heparin results are below expected values, and patient dosage has  been confirmed, suggest follow up testing of antithrombin III levels. Performed at [facility], [address] [place], [place] [address]   CBC     Status: Abnormal   Collection Time: 07/31/23  5:36 AM  Result Value Ref Range   WBC 9.2 4.0 - 10.5 K/uL   RBC 3.44 (L) 3.87 - 5.11 MIL/uL   Hemoglobin 10.7 (L) 12.0 - 15.0 g/dL   HCT 64.4 (L) 03.4 - 74.2 %   MCV 92.7 80.0 - 100.0 fL   MCH 31.1 26.0 - 34.0 pg   MCHC 33.5 30.0 - 36.0 g/dL   RDW 59.5 63.8 - 75.6 %   Platelets 180 150 - 400 K/uL   nRBC 0.0 0.0 - 0.2 %    Comment: Performed at [facility], [address], [place], [place] [address]  Basic metabolic panel     Status: Abnormal   Collection Time: 07/31/23  5:36 AM  Result Value Ref Range   Sodium 138 135 - 145 mmol/L    Comment: REPEATED TO VERIFY   Potassium 3.9 3.5 - 5.1 mmol/L   Chloride 105 98 - 111 mmol/L   CO2 24 22 - 32 mmol/L   Glucose, Bld 132 (H) 70 - 99 mg/dL    Comment: Glucose reference range applies only to samples taken after fasting for at least 8 hours.   BUN 30 (H) 8 - 23 mg/dL   Creatinine, Ser 5.18 (H) 0.44 - 1.00 mg/dL    Comment: REPEATED TO VERIFY   Calcium 8.1 (L) 8.9 - 10.3 mg/dL   GFR, Estimated 57 (L) >60 mL/min    Comment: (NOTE) Calculated using the CKD-EPI Creatinine Equation (2021)    Anion gap 9 5 - 15    Comment: Performed at [facility], [address] [place], [place] [address]  APTT     Status: Abnormal   Collection Time: 07/31/23  5:36 AM  Result Value Ref Range   aPTT 68 (H) 24 - 36 seconds    Comment:        IF BASELINE aPTT IS ELEVATED, SUGGEST PATIENT RISK ASSESSMENT BE USED TO DETERMINE APPROPRIATE ANTICOAGULANT THERAPY. Performed at [facility], [address] [place], [place] [address]   ECHOCARDIOGRAM COMPLETE     Status: None   Collection Time: 07/31/23  7:59 AM   Result Value Ref Range   Weight 3,440 oz   Height 64 in   BP 111/68 mmHg   Ao pk vel 1.66 m/s   AV Area VTI 1.84 cm2   AR max vel 2.07 cm2   AV Mean grad 6.0 mmHg   AV Peak grad 11.0 mmHg   S' Lateral 3.30 cm   AV Area mean vel 1.89 cm2   Area-P  1/2 3.31 cm2   Est EF 60 - 65%   Glucose, capillary     Status: Abnormal   Collection Time: 07/31/23  8:23 AM  Result Value Ref Range   Glucose-Capillary 104 (H) 70 - 99 mg/dL    Comment: Glucose reference range applies only to samples taken after fasting for at least 8 hours.  Glucose, capillary     Status: Abnormal   Collection Time: 07/31/23 11:47 AM  Result Value Ref Range   Glucose-Capillary 160 (H) 70 - 99 mg/dL    Comment: Glucose reference range applies only to samples taken after fasting for at least 8 hours.  Respiratory (~20 pathogens) panel by PCR     Status: Abnormal   Collection Time: 07/31/23  2:59 PM   Specimen: Nasopharyngeal Swab; Respiratory  Result Value Ref Range   Adenovirus NOT DETECTED NOT DETECTED   Coronavirus 229E NOT DETECTED NOT DETECTED    Comment: (NOTE) The Coronavirus on the Respiratory Panel, DOES NOT test for the novel  Coronavirus (2019 nCoV)    Coronavirus HKU1 NOT DETECTED NOT DETECTED   Coronavirus NL63 NOT DETECTED NOT DETECTED   Coronavirus OC43 NOT DETECTED NOT DETECTED   Metapneumovirus NOT DETECTED NOT DETECTED   Rhinovirus / Enterovirus NOT DETECTED NOT DETECTED   Influenza A H3 DETECTED (A) NOT DETECTED   Influenza B NOT DETECTED NOT DETECTED   Parainfluenza Virus 1 NOT DETECTED NOT DETECTED   Parainfluenza Virus 2 NOT DETECTED NOT DETECTED   Parainfluenza Virus 3 NOT DETECTED NOT DETECTED   Parainfluenza Virus 4 NOT DETECTED NOT DETECTED   Respiratory Syncytial Virus NOT DETECTED NOT DETECTED   Bordetella pertussis NOT DETECTED NOT DETECTED   Bordetella Parapertussis NOT DETECTED NOT DETECTED   Chlamydophila pneumoniae NOT DETECTED NOT DETECTED   Mycoplasma pneumoniae NOT  DETECTED NOT DETECTED    Comment: Performed at [facility], [address], [place], [place] [address]  Glucose, capillary     Status: Abnormal   Collection Time: 07/31/23  4:36 PM  Result Value Ref Range   Glucose-Capillary 209 (H) 70 - 99 mg/dL    Comment: Glucose reference range applies only to samples taken after fasting for at least 8 hours.  Glucose, capillary     Status: Abnormal   Collection Time: 07/31/23  8:55 PM  Result Value Ref Range   Glucose-Capillary 173 (H) 70 - 99 mg/dL    Comment: Glucose reference range applies only to samples taken after fasting for at least 8 hours.  Heparin level (unfractionated)     Status: Abnormal   Collection Time: 08/01/23  4:43 AM  Result Value Ref Range   Heparin Unfractionated 1.01 (H) 0.30 - 0.70 IU/mL    Comment: (NOTE) The clinical reportable range upper limit is being lowered to >1.10 to align with the FDA approved guidance for the current laboratory assay.  If heparin results are below expected values, and patient dosage has  been confirmed, suggest follow up testing of antithrombin III levels. Performed at [facility], [address] [place], [place] [address]   APTT     Status: Abnormal   Collection Time: 08/01/23  4:43 AM  Result Value Ref Range   aPTT 46 (H) 24 - 36 seconds    Comment:        IF BASELINE aPTT IS ELEVATED, SUGGEST PATIENT RISK ASSESSMENT BE USED TO DETERMINE APPROPRIATE ANTICOAGULANT THERAPY. Performed at [facility], [address] [place], [place] [address]   Glucose,  capillary     Status: Abnormal   Collection Time: 08/01/23  8:53 AM  Result Value Ref Range   Glucose-Capillary 168 (H) 70 - 99 mg/dL    Comment: Glucose reference range applies only to samples taken after fasting for at least 8 hours.  Glucose, capillary     Status: Abnormal   Collection Time: 08/01/23 12:04 PM  Result Value Ref Range   Glucose-Capillary 206 (H) 70 - 99 mg/dL    Comment: Glucose  reference range applies only to samples taken after fasting for at least 8 hours.  Glucose, capillary     Status: Abnormal   Collection Time: 08/01/23  5:14 PM  Result Value Ref Range   Glucose-Capillary 240 (H) 70 - 99 mg/dL    Comment: Glucose reference range applies only to samples taken after fasting for at least 8 hours.  Glucose, capillary     Status: Abnormal   Collection Time: 08/01/23  9:34 PM  Result Value Ref Range   Glucose-Capillary 199 (H) 70 - 99 mg/dL    Comment: Glucose reference range applies only to samples taken after fasting for at least 8 hours.  CBC     Status: Abnormal   Collection Time: 08/02/23  4:16 AM  Result Value Ref Range   WBC 6.1 4.0 - 10.5 K/uL   RBC 3.71 (L) 3.87 - 5.11 MIL/uL   Hemoglobin 11.7 (L) 12.0 - 15.0 g/dL   HCT 03.4 (L) 74.2 - 59.5 %   MCV 94.9 80.0 - 100.0 fL   MCH 31.5 26.0 - 34.0 pg   MCHC 33.2 30.0 - 36.0 g/dL   RDW 63.8 75.6 - 43.3 %   Platelets 206 150 - 400 K/uL   nRBC 0.0 0.0 - 0.2 %    Comment: Performed at [facility] [address], [place], [place] [address]  Basic metabolic panel     Status: Abnormal   Collection Time: 08/02/23  4:16 AM  Result Value Ref Range   Sodium 138 135 - 145 mmol/L   Potassium 5.4 (H) 3.5 - 5.1 mmol/L   Chloride 104 98 - 111 mmol/L   CO2 27 22 - 32 mmol/L   Glucose, Bld 192 (H) 70 - 99 mg/dL    Comment: Glucose reference range applies only to samples taken after fasting for at least 8 hours.   BUN 18 8 - 23 mg/dL   Creatinine, Ser 8.41 0.44 - 1.00 mg/dL   Calcium 9.3 8.9 - 66.0 mg/dL   GFR, Estimated >63 >01 mL/min    Comment: (NOTE) Calculated using the CKD-EPI Creatinine Equation (2021)    Anion gap 7 5 - 15    Comment: Performed at [facility], [address] [place], [place] [address]  Procalcitonin     Status: None   Collection Time: 08/02/23  4:16 AM  Result Value Ref Range   Procalcitonin <0.10 ng/mL    Comment:        Interpretation: PCT (Procalcitonin)  <= 0.5 ng/mL: Systemic infection (sepsis) is not likely. Local bacterial infection is possible. (NOTE)       Sepsis PCT Algorithm           Lower Respiratory Tract                                      Infection PCT Algorithm    ----------------------------     ----------------------------  PCT < 0.25 ng/mL                PCT < 0.10 ng/mL          Strongly encourage             Strongly discourage   discontinuation of antibiotics    initiation of antibiotics    ----------------------------     -----------------------------       PCT 0.25 - 0.50 ng/mL            PCT 0.10 - 0.25 ng/mL               OR       >80% decrease in PCT            Discourage initiation of                                            antibiotics      Encourage discontinuation           of antibiotics    ----------------------------     -----------------------------         PCT >= 0.50 ng/mL              PCT 0.26 - 0.50 ng/mL               AND        <80% decrease in PCT             Encourage initiation of                                             antibiotics       Encourage continuation           of antibiotics    ----------------------------     -----------------------------        PCT >= 0.50 ng/mL                  PCT > 0.50 ng/mL               AND         increase in PCT                  Strongly encourage                                      initiation of antibiotics    Strongly encourage escalation           of antibiotics                                     -----------------------------                                           PCT <= 0.25 ng/mL  OR                                        > 80% decrease in PCT                                      Discontinue / Do not initiate                                             antibiotics  Performed at [facility], [address] [place], [place] [address]   Glucose, capillary     Status:  Abnormal   Collection Time: 08/02/23  8:53 AM  Result Value Ref Range   Glucose-Capillary 165 (H) 70 - 99 mg/dL    Comment: Glucose reference range applies only to samples taken after fasting for at least 8 hours.  Glucose, capillary     Status: Abnormal   Collection Time: 08/02/23 11:50 AM  Result Value Ref Range   Glucose-Capillary 151 (H) 70 - 99 mg/dL    Comment: Glucose reference range applies only to samples taken after fasting for at least 8 hours.  Glucose, capillary     Status: Abnormal   Collection Time: 08/02/23  4:48 PM  Result Value Ref Range   Glucose-Capillary 201 (H) 70 - 99 mg/dL    Comment: Glucose reference range applies only to samples taken after fasting for at least 8 hours.  Glucose, capillary     Status: Abnormal   Collection Time: 08/02/23  8:37 PM  Result Value Ref Range   Glucose-Capillary 239 (H) 70 - 99 mg/dL    Comment: Glucose reference range applies only to samples taken after fasting for at least 8 hours.  Glucose, capillary     Status: Abnormal   Collection Time: 08/02/23 11:09 PM  Result Value Ref Range   Glucose-Capillary 208 (H) 70 - 99 mg/dL    Comment: Glucose reference range applies only to samples taken after fasting for at least 8 hours.  Glucose, capillary     Status: Abnormal   Collection Time: 08/03/23  3:29 AM  Result Value Ref Range   Glucose-Capillary 192 (H) 70 - 99 mg/dL    Comment: Glucose reference range applies only to samples taken after fasting for at least 8 hours.  Glucose, capillary     Status: Abnormal   Collection Time: 08/03/23  8:49 AM  Result Value Ref Range   Glucose-Capillary 287 (H) 70 - 99 mg/dL    Comment: Glucose reference range applies only to samples taken after fasting for at least 8 hours.  Glucose, capillary     Status: None   Collection Time: 08/03/23 12:07 PM  Result Value Ref Range   Glucose-Capillary 94 70 - 99 mg/dL    Comment: Glucose reference range applies only to samples taken after fasting for  at least 8 hours.  Glucose, capillary     Status: Abnormal   Collection Time: 08/03/23  5:04 PM  Result Value Ref Range   Glucose-Capillary 231 (H) 70 - 99 mg/dL    Comment: Glucose reference range applies only to samples taken after fasting for at least 8 hours.  Glucose, capillary  Status: Abnormal   Collection Time: 08/03/23  9:58 PM  Result Value Ref Range   Glucose-Capillary 229 (H) 70 - 99 mg/dL    Comment: Glucose reference range applies only to samples taken after fasting for at least 8 hours.  CBC     Status: Abnormal   Collection Time: 08/04/23  7:15 AM  Result Value Ref Range   WBC 9.9 4.0 - 10.5 K/uL   RBC 3.79 (L) 3.87 - 5.11 MIL/uL   Hemoglobin 11.6 (L) 12.0 - 15.0 g/dL   HCT 14.7 (L) 82.9 - 56.2 %   MCV 93.1 80.0 - 100.0 fL   MCH 30.6 26.0 - 34.0 pg   MCHC 32.9 30.0 - 36.0 g/dL   RDW 13.0 86.5 - 78.4 %   Platelets 303 150 - 400 K/uL   nRBC 0.0 0.0 - 0.2 %    Comment: Performed at [facility], [address] [place], [place] [address]  Basic metabolic panel     Status: Abnormal   Collection Time: 08/04/23  7:15 AM  Result Value Ref Range   Sodium 134 (L) 135 - 145 mmol/L   Potassium 4.8 3.5 - 5.1 mmol/L   Chloride 97 (L) 98 - 111 mmol/L   CO2 27 22 - 32 mmol/L   Glucose, Bld 155 (H) 70 - 99 mg/dL    Comment: Glucose reference range applies only to samples taken after fasting for at least 8 hours.   BUN 17 8 - 23 mg/dL   Creatinine, Ser 5.28 0.44 - 1.00 mg/dL   Calcium 8.9 8.9 - 41.3 mg/dL   GFR, Estimated >24 >40 mL/min    Comment: (NOTE) Calculated using the CKD-EPI Creatinine Equation (2021)    Anion gap 10 5 - 15    Comment: Performed at [facility], [address] [place], [place] [address]  Glucose, capillary     Status: Abnormal   Collection Time: 08/04/23  8:42 AM  Result Value Ref Range   Glucose-Capillary 155 (H) 70 - 99 mg/dL    Comment: Glucose reference range applies only to samples taken after fasting for at least 8  hours.  Glucose, capillary     Status: None   Collection Time: 08/04/23 11:40 AM  Result Value Ref Range   Glucose-Capillary 94 70 - 99 mg/dL    Comment: Glucose reference range applies only to samples taken after fasting for at least 8 hours.  Glucose, capillary     Status: Abnormal   Collection Time: 08/04/23  4:50 PM  Result Value Ref Range   Glucose-Capillary 278 (H) 70 - 99 mg/dL    Comment: Glucose reference range applies only to samples taken after fasting for at least 8 hours.  Glucose, capillary     Status: Abnormal   Collection Time: 08/04/23  4:53 PM  Result Value Ref Range   Glucose-Capillary 117 (H) 70 - 99 mg/dL    Comment: Glucose reference range applies only to samples taken after fasting for at least 8 hours.  Glucose, capillary     Status: Abnormal   Collection Time: 08/04/23  9:18 PM  Result Value Ref Range   Glucose-Capillary 239 (H) 70 - 99 mg/dL    Comment: Glucose reference range applies only to samples taken after fasting for at least 8 hours.  Glucose, capillary     Status: Abnormal   Collection Time: 08/05/23  8:53 AM  Result Value Ref Range   Glucose-Capillary 133 (H) 70 - 99 mg/dL  Comment: Glucose reference range applies only to samples taken after fasting for at least 8 hours.  Glucose, capillary     Status: Abnormal   Collection Time: 08/05/23 12:38 PM  Result Value Ref Range   Glucose-Capillary 143 (H) 70 - 99 mg/dL    Comment: Glucose reference range applies only to samples taken after fasting for at least 8 hours.  Glucose, capillary     Status: Abnormal   Collection Time: 08/05/23  4:25 PM  Result Value Ref Range   Glucose-Capillary 271 (H) 70 - 99 mg/dL    Comment: Glucose reference range applies only to samples taken after fasting for at least 8 hours.  Glucose, capillary     Status: Abnormal   Collection Time: 08/05/23  8:57 PM  Result Value Ref Range   Glucose-Capillary 210 (H) 70 - 99 mg/dL    Comment: Glucose reference range applies  only to samples taken after fasting for at least 8 hours.  Glucose, capillary     Status: None   Collection Time: 08/06/23 12:00 PM  Result Value Ref Range   Glucose-Capillary 97 70 - 99 mg/dL    Comment: Glucose reference range applies only to samples taken after fasting for at least 8 hours.  POCT CBG (Fasting - Glucose)     Status: Abnormal   Collection Time: 08/14/23  2:21 PM  Result Value Ref Range   Glucose Fasting, POC 237 (A) 70 - 99 mg/dL  [id number]     Status: Abnormal   Collection Time: 08/14/23  2:58 PM  Result Value Ref Range   Glucose 206 (H) 70 - 99 mg/dL   BUN 18 8 - 27 mg/dL   Creatinine, Ser 1.30 (H) 0.57 - 1.00 mg/dL   eGFR 52 (L) >86 VH/QIO/9.62   BUN/Creatinine Ratio 16 12 - 28   Sodium 138 134 - 144 mmol/L   Potassium 4.3 3.5 - 5.2 mmol/L   Chloride 99 96 - 106 mmol/L   CO2 25 20 - 29 mmol/L   Calcium 8.5 (L) 8.7 - 10.3 mg/dL   Total Protein 6.0 6.0 - 8.5 g/dL   Albumin 3.9 3.8 - 4.8 g/dL   Globulin, Total 2.1 1.5 - 4.5 g/dL   Bilirubin Total 0.7 0.0 - 1.2 mg/dL   Alkaline Phosphatase 106 44 - 121 IU/L   AST 11 0 - 40 IU/L   ALT 12 0 - 32 IU/L  CBC with Diff     Status: None   Collection Time: 08/14/23  2:58 PM  Result Value Ref Range   WBC 9.5 3.4 - 10.8 x10E3/uL   RBC 4.18 3.77 - 5.28 x10E6/uL   Hemoglobin 13.2 11.1 - 15.9 g/dL   Hematocrit 95.2 84.1 - 46.6 %   MCV 95 79 - 97 fL   MCH 31.6 26.6 - 33.0 pg   MCHC 33.2 31.5 - 35.7 g/dL   RDW 32.4 40.1 - 02.7 %   Platelets 312 150 - 450 x10E3/uL   Neutrophils 67 Not Estab. %   Lymphs 21 Not Estab. %   Monocytes 9 Not Estab. %   Eos 1 Not Estab. %   Basos 1 Not Estab. %   Neutrophils Absolute 6.4 1.4 - 7.0 x10E3/uL   Lymphocytes Absolute 2.0 0.7 - 3.1 x10E3/uL   Monocytes Absolute 0.9 0.1 - 0.9 x10E3/uL   EOS (ABSOLUTE) 0.1 0.0 - 0.4 x10E3/uL   Basophils Absolute 0.1 0.0 - 0.2 x10E3/uL   Immature Granulocytes 1 Not Estab. %   Immature  Grans (Abs) 0.1 0.0 - 0.1 x10E3/uL  Magnesium     Status:  Abnormal   Collection Time: 08/14/23  2:58 PM  Result Value Ref Range   Magnesium 1.1 (L) 1.6 - 2.3 mg/dL      Assessment & Plan:  Cipro ear drops sent in.   Problem List Items Addressed This Visit       Nervous and Auditory   Acute otitis media - Primary    Return if symptoms worsen or fail to improve.   Total time spent: 25 minutes  [employer], NP  09/28/2023   This document may have been prepared by Dragon Voice Recognition software and as such may include unintentional dictation errors.

## 2023-09-29 ENCOUNTER — Other Ambulatory Visit: Payer: Self-pay | Admitting: Cardiology

## 2023-09-29 ENCOUNTER — Telehealth: Payer: Self-pay | Admitting: Family

## 2023-09-29 ENCOUNTER — Other Ambulatory Visit: Payer: Self-pay | Admitting: Family

## 2023-09-29 MED ORDER — OFLOXACIN 0.3 % OT SOLN: 5.0000 [drp] | Freq: Two times a day (BID) | OTIC | 0 refills | Status: DC

## 2023-09-29 NOTE — Telephone Encounter (Signed)
 Patient's daughter called and states that the patient's cipro ear drops are not covered by insurance. Please send an alternative to [facility].

## 2023-10-01 ENCOUNTER — Other Ambulatory Visit: Payer: Self-pay

## 2023-10-01 ENCOUNTER — Encounter: Payer: Self-pay | Admitting: Family

## 2023-10-01 MED ORDER — AMIODARONE HCL 400 MG PO TABS: 400.0000 mg | ORAL_TABLET | Freq: Every day | ORAL | 1 refills | Status: DC

## 2023-10-01 MED ORDER — VOQUEZNA 20 MG PO TABS: 20.0000 mg | ORAL_TABLET | Freq: Every day | ORAL | 0 refills | Status: DC

## 2023-10-01 NOTE — Assessment & Plan Note (Signed)
 Rechecking CMP today.  Will call with results when available and supplement as needed

## 2023-10-01 NOTE — Assessment & Plan Note (Signed)
 Patient is seen by Cardiology, who manage this condition.  She is well controlled with current therapy.   Will defer to them for further changes to plan of care.

## 2023-10-01 NOTE — Assessment & Plan Note (Signed)
 Rechecking CMP today.  Will call with results and send to nephrology if needed.  Reassess at follow up

## 2023-10-04 MED ORDER — AMIODARONE HCL 400 MG PO TABS: 400.0000 mg | ORAL_TABLET | Freq: Every day | ORAL | 1 refills | Status: DC

## 2023-10-09 ENCOUNTER — Encounter: Payer: Self-pay | Admitting: Cardiovascular Disease

## 2023-10-09 ENCOUNTER — Ambulatory Visit (INDEPENDENT_AMBULATORY_CARE_PROVIDER_SITE_OTHER): Payer: Medicare Other | Admitting: Cardiovascular Disease

## 2023-10-09 VITALS — BP 121/71 | HR 72 | Ht 64.0 in | Wt 223.0 lb

## 2023-10-09 DIAGNOSIS — I4891 Unspecified atrial fibrillation: Secondary | ICD-10-CM | POA: Diagnosis not present

## 2023-10-09 DIAGNOSIS — I10 Essential (primary) hypertension: Secondary | ICD-10-CM

## 2023-10-09 DIAGNOSIS — R0602 Shortness of breath: Secondary | ICD-10-CM

## 2023-10-09 DIAGNOSIS — J449 Chronic obstructive pulmonary disease, unspecified: Secondary | ICD-10-CM

## 2023-10-09 DIAGNOSIS — E782 Mixed hyperlipidemia: Secondary | ICD-10-CM

## 2023-10-09 NOTE — Progress Notes (Signed)
 Cardiology Office Note   Date:  10/09/2023   ID:  Asiana, Benninger 15-Oct-1945, MRN [id number]  PCP:  Miki Kins, FNP  Cardiologist:  Adrian Blackwater, MD      History of Present Illness: Stephanie Small is a 78 y.o. female who presents for  Chief Complaint  Patient presents with   Follow-up    2 month follow up    Doing good      Past Medical History:  Diagnosis Date   Arthritis    Atypical squamous cells of undetermined significance (ASCUS) on Papanicolaou smear of cervix 10/08/2016   Chest pain 09/20/2020   COPD (chronic obstructive pulmonary disease) (HCC)    Diabetes mellitus without complication (HCC)    Diarrhea 01/09/2021   Elevated troponin    Glaucoma 2010   History of adenomatous polyp of colon    Hypertension 2005   Hypomagnesemia 01/09/2021   Knee pain 05/06/2022   Low back pain of over 3 months duration 05/06/2022   Personal history of colonic polyps    Respiratory distress 10/25/2015   Sepsis (HCC) 01/09/2021   Thyroid cyst    UTI (urinary tract infection) 01/09/2021   Wheezing 10/25/2015     Past Surgical History:  Procedure Laterality Date   CAROTID STENT INSERTION  2011   CHOLECYSTECTOMY  2009   COLONOSCOPY  2007   COLONOSCOPY WITH PROPOFOL N/A 08/18/2016   Procedure: COLONOSCOPY WITH PROPOFOL;  Surgeon: Midge Minium, MD;  Location: [facility];  Service: Endoscopy;  Laterality: N/A;   FOOT SURGERY     SHOULDER SURGERY Bilateral 2004   UPPER GI ENDOSCOPY       Current Outpatient Medications  Medication Sig Dispense Refill   albuterol (PROVENTIL) (2.5 MG/3ML) 0.083% nebulizer solution Take 3 mLs (2.5 mg total) by nebulization every 4 (four) hours as needed for wheezing or shortness of breath. 600 mL 3   amiodarone (PACERONE) 400 MG tablet Take 1 tablet (400 mg total) by mouth daily. 30 tablet 1   amiodarone (PACERONE) 400 MG tablet Take 1 tablet (400 mg total) by mouth daily. 30 tablet 1   apixaban (ELIQUIS) 5 MG  TABS tablet TAKE 1 TABLET BY MOUTH TWICE DAILY 180 tablet 3   citalopram (CELEXA) 20 MG tablet TAKE 1 TABLET BY MOUTH ONCE DAILY 90 tablet 1   cyclobenzaprine (FLEXERIL) 5 MG tablet Take 5 mg by mouth 3 (three) times daily as needed for muscle spasms.     dapagliflozin propanediol (FARXIGA) 10 MG TABS tablet Take 1 tablet (10 mg total) by mouth daily. 30 tablet 3   diclofenac Sodium (VOLTAREN) 1 % GEL Apply 1 Application topically 4 (four) times daily.     diltiazem (CARDIZEM CD) 360 MG 24 hr capsule Take 1 capsule (360 mg total) by mouth daily. (Patient not taking: Reported on 08/14/2023) 30 capsule 1   fentaNYL (DURAGESIC) 75 MCG/HR Place 1 patch onto the skin every 3 (three) days.     fluticasone (FLONASE) 50 MCG/ACT nasal spray Place 2 sprays into both nostrils daily.     fluticasone furoate-vilanterol (BREO ELLIPTA) 100-25 MCG/INH AEPB Inhale 1 puff into the lungs daily as needed. (Patient not taking: Reported on 08/14/2023)     furosemide (LASIX) 20 MG tablet TAKE 1 TABLET BY MOUTH ONCE DAILY 90 tablet 3   gabapentin (NEURONTIN) 400 MG capsule Take 400 mg by mouth 3 (three) times daily.     glucose blood (ONETOUCH VERIO) test strip Use as instructed 100  each 1   Ipratropium-Albuterol (COMBIVENT) 20-100 MCG/ACT AERS respimat Inhale 1 puff into the lungs 3 (three) times daily. (Patient not taking: Reported on 08/14/2023) 1 each 0   isosorbide mononitrate (IMDUR) 30 MG 24 hr tablet TAKE 1 TABLET BY MOUTH ONCE DAILY 90 tablet 2   levothyroxine (SYNTHROID) 50 MCG tablet Take 1 tablet (50 mcg total) by mouth daily at 6 (six) AM. 30 tablet 2   lidocaine (LIDODERM) 5 % Place 1 patch onto the skin daily as needed. Pt has not been using as often due to other meds, only using PRN (Patient not taking: Reported on 08/14/2023)     losartan (COZAAR) 50 MG tablet TAKE 1 TABLET BY MOUTH ONCE DAILY 90 tablet 2   naloxone (NARCAN) nasal spray 4 mg/0.1 mL Place 1 spray into the nose once. (Patient not taking:  Reported on 08/14/2023)     nystatin (MYCOSTATIN) 100000 UNIT/ML suspension Take 5 mLs (500,000 Units total) by mouth 4 (four) times daily. 60 mL 0   ofloxacin (FLOXIN) 0.3 % OTIC solution Place 5 drops into both ears 2 (two) times daily. 10 mL 0   omeprazole (PRILOSEC) 40 MG capsule TAKE 1 CAPSULE BY MOUTH ONCE DAILY 90 capsule 2   ondansetron (ZOFRAN) 4 MG tablet Take 4 mg by mouth every 8 (eight) hours as needed.     Oxycodone HCl 10 MG TABS Take 10 mg by mouth daily.     rifaximin (XIFAXAN) 550 MG TABS tablet TAKE 1 TABLET BY MOUTH TWICE DAILY 180 tablet 2   rosuvastatin (CRESTOR) 20 MG tablet TAKE 1 TABLET BY MOUTH ONCE EVERY EVENING 90 tablet 2   sitaGLIPtin (JANUVIA) 100 MG tablet TAKE 1 TABLET BY MOUTH ONCE DAILY 90 tablet 3   SUMAtriptan (IMITREX) 100 MG tablet TAKE 1 TABLET BY MOUTH AS NEEDED FOR HEADACHE. MAX 2 TABLETS PER DAY 36 tablet 3   tolterodine (DETROL LA) 2 MG 24 hr capsule TAKE 1 CAPSULE BY MOUTH ONCE DAILY 90 capsule 1   vitamin C (ASCORBIC ACID) 500 MG tablet Take 500 mg by mouth daily. (Patient not taking: Reported on 08/14/2023)     Vonoprazan Fumarate (VOQUEZNA) 20 MG TABS Take 20 mg by mouth daily. 30 tablet 0   Vonoprazan Fumarate (VOQUEZNA) 20 MG TABS Take 20 mg by mouth daily.     No current facility-administered medications for this visit.    Allergies:   Shellfish-derived products and Shellfish allergy    Social History:   reports that she has been smoking e-cigarettes. She has never used smokeless tobacco. She reports that she does not drink alcohol and does not use drugs.   Family History:  family history includes Hypertension in her mother.    ROS:     Review of Systems  Constitutional: Negative.   HENT: Negative.    Eyes: Negative.   Respiratory: Negative.    Gastrointestinal: Negative.   Genitourinary: Negative.   Musculoskeletal: Negative.   Skin: Negative.   Neurological: Negative.   Endo/Heme/Allergies: Negative.   Psychiatric/Behavioral:  Negative.    All other systems reviewed and are negative.     All other systems are reviewed and negative.    PHYSICAL EXAM: VS:  BP 121/71   Pulse 72   Ht 5\' 4"  (1.626 m)   Wt 223 lb (101.2 kg)   SpO2 97%   BMI 38.28 kg/m  , BMI Body mass index is 38.28 kg/m. Last weight:  Wt Readings from Last 3 Encounters:  10/09/23 223 lb (  101.2 kg)  09/28/23 211 lb (95.7 kg)  08/14/23 213 lb 9.6 oz (96.9 kg)     Physical Exam Constitutional:      Appearance: Normal appearance.  Cardiovascular:     Rate and Rhythm: Normal rate and regular rhythm.     Heart sounds: Normal heart sounds.  Pulmonary:     Effort: Pulmonary effort is normal.     Breath sounds: Normal breath sounds.  Musculoskeletal:     Right lower leg: No edema.     Left lower leg: No edema.  Neurological:     Mental Status: She is alert.       EKG:   Recent Labs: 07/29/2023: B Natriuretic Peptide 425.1; TSH 0.058 08/14/2023: ALT 12; BUN 18; Creatinine, Ser 1.11; Hemoglobin 13.2; Magnesium 1.1; Platelets 312; Potassium 4.3; Sodium 138    Lipid Panel    Component Value Date/Time   CHOL 117 01/30/2023 1550   TRIG 93 01/30/2023 1550   HDL 55 01/30/2023 1550   CHOLHDL 2.1 01/30/2023 1550   CHOLHDL 2.5 01/10/2021 0659   VLDL 13 01/10/2021 0659   LDLCALC 44 01/30/2023 1550      Other studies Reviewed: Additional studies/ records that were reviewed today include:  Review of the above records demonstrates:       No data to display            ASSESSMENT AND PLAN:    ICD-10-CM   1. COPD with hypoxia (HCC)  J44.9    On home O2, and was placed in [facility] and sats 95 off of it. May need to come off but get pulmonary to decide.    2. Atrial fibrillation with rapid ventricular response (HCC)  I48.91     3. Essential hypertension  I10     4. SOB (shortness of breath)  R06.02     5. Mixed hyperlipidemia  E78.2        Problem List Items Addressed This Visit       Cardiovascular and Mediastinum    Essential hypertension   Atrial fibrillation with rapid ventricular response (HCC)     Other   HLD (hyperlipidemia)   Other Visit Diagnoses       COPD with hypoxia (HCC)    -  Primary   On home O2, and was placed in [facility] and sats 95 off of it. May need to come off but get pulmonary to decide.     SOB (shortness of breath)              Disposition:   No follow-ups on file.    Total time spent: 30 minutes  Signed,  Adrian Blackwater, MD  10/09/2023 11:51 AM    [facility]

## 2023-11-02 ENCOUNTER — Other Ambulatory Visit: Payer: Self-pay

## 2023-11-02 MED ORDER — LEVOTHYROXINE SODIUM 50 MCG PO TABS: 50.0000 ug | ORAL_TABLET | Freq: Every day | ORAL | 2 refills | Status: DC

## 2023-11-09 ENCOUNTER — Telehealth: Payer: Self-pay | Admitting: Internal Medicine

## 2023-11-09 ENCOUNTER — Ambulatory Visit: Payer: Self-pay | Admitting: Internal Medicine

## 2023-11-09 NOTE — Telephone Encounter (Signed)
 New patient appointment no show. Discharged.

## 2023-11-13 ENCOUNTER — Telehealth: Payer: Self-pay | Admitting: Family

## 2023-11-13 NOTE — Telephone Encounter (Signed)
 Pt daughter called to see what she can give her mom in the mean time until her upcoming appt with Mylinda Asa.  Pt daughter states that her mom is complaining of groin pain, and might have a yeast infection or UTI based on what she has been telling her. Pt daughter wants to be advised on what she could do or get for her mother in the meantime of her upcoming appt on Tuesday, 11/17/23  Please advise

## 2023-11-15 ENCOUNTER — Emergency Department

## 2023-11-15 ENCOUNTER — Observation Stay
Admission: EM | Admit: 2023-11-15 | Discharge: 2023-11-17 | Disposition: A | Discharge status: Home / Self Care | Attending: Internal Medicine | Admitting: Internal Medicine

## 2023-11-15 ENCOUNTER — Other Ambulatory Visit: Payer: Self-pay

## 2023-11-15 DIAGNOSIS — R911 Solitary pulmonary nodule: Secondary | ICD-10-CM | POA: Diagnosis not present

## 2023-11-15 DIAGNOSIS — E1139 Type 2 diabetes mellitus with other diabetic ophthalmic complication: Secondary | ICD-10-CM | POA: Insufficient documentation

## 2023-11-15 DIAGNOSIS — E039 Hypothyroidism, unspecified: Secondary | ICD-10-CM | POA: Diagnosis present

## 2023-11-15 DIAGNOSIS — F419 Anxiety disorder, unspecified: Secondary | ICD-10-CM | POA: Diagnosis not present

## 2023-11-15 DIAGNOSIS — N179 Acute kidney failure, unspecified: Secondary | ICD-10-CM | POA: Diagnosis not present

## 2023-11-15 DIAGNOSIS — B379 Candidiasis, unspecified: Secondary | ICD-10-CM | POA: Diagnosis not present

## 2023-11-15 DIAGNOSIS — I10 Essential (primary) hypertension: Secondary | ICD-10-CM | POA: Diagnosis present

## 2023-11-15 DIAGNOSIS — Z6838 Body mass index (BMI) 38.0-38.9, adult: Secondary | ICD-10-CM | POA: Diagnosis not present

## 2023-11-15 DIAGNOSIS — Z7901 Long term (current) use of anticoagulants: Secondary | ICD-10-CM

## 2023-11-15 DIAGNOSIS — G8929 Other chronic pain: Secondary | ICD-10-CM | POA: Insufficient documentation

## 2023-11-15 DIAGNOSIS — I480 Paroxysmal atrial fibrillation: Secondary | ICD-10-CM | POA: Insufficient documentation

## 2023-11-15 DIAGNOSIS — J9601 Acute respiratory failure with hypoxia: Secondary | ICD-10-CM | POA: Insufficient documentation

## 2023-11-15 DIAGNOSIS — F17290 Nicotine dependence, other tobacco product, uncomplicated: Secondary | ICD-10-CM | POA: Diagnosis not present

## 2023-11-15 DIAGNOSIS — E669 Obesity, unspecified: Secondary | ICD-10-CM | POA: Insufficient documentation

## 2023-11-15 DIAGNOSIS — R0602 Shortness of breath: Secondary | ICD-10-CM | POA: Diagnosis present

## 2023-11-15 DIAGNOSIS — Z79899 Other long term (current) drug therapy: Secondary | ICD-10-CM | POA: Diagnosis not present

## 2023-11-15 DIAGNOSIS — G894 Chronic pain syndrome: Secondary | ICD-10-CM | POA: Diagnosis present

## 2023-11-15 DIAGNOSIS — E1169 Type 2 diabetes mellitus with other specified complication: Secondary | ICD-10-CM

## 2023-11-15 DIAGNOSIS — J9621 Acute and chronic respiratory failure with hypoxia: Secondary | ICD-10-CM

## 2023-11-15 DIAGNOSIS — J189 Pneumonia, unspecified organism: Secondary | ICD-10-CM

## 2023-11-15 DIAGNOSIS — E119 Type 2 diabetes mellitus without complications: Secondary | ICD-10-CM

## 2023-11-15 DIAGNOSIS — J441 Chronic obstructive pulmonary disease with (acute) exacerbation: Principal | ICD-10-CM | POA: Diagnosis present

## 2023-11-15 LAB — GLUCOSE, CAPILLARY
Glucose-Capillary: 173 mg/dL — ABNORMAL HIGH (ref 70–99)
Glucose-Capillary: 222 mg/dL — ABNORMAL HIGH (ref 70–99)
Glucose-Capillary: 200 mg/dL — ABNORMAL HIGH (ref 70–99)
Glucose-Capillary: 251 mg/dL — ABNORMAL HIGH (ref 70–99)
Glucose-Capillary: 215 mg/dL — ABNORMAL HIGH (ref 70–99)

## 2023-11-15 LAB — CBC
HCT: 38 % (ref 36.0–46.0)
Hemoglobin: 12.4 g/dL (ref 12.0–15.0)
MCH: 30.7 pg (ref 26.0–34.0)
MCHC: 32.6 g/dL (ref 30.0–36.0)
MCV: 94.1 fL (ref 80.0–100.0)
Platelets: 225 10*3/uL (ref 150–400)
RBC: 4.04 MIL/uL (ref 3.87–5.11)
RDW: 13.1 % (ref 11.5–15.5)
WBC: 6.4 10*3/uL (ref 4.0–10.5)
nRBC: 0 % (ref 0.0–0.2)

## 2023-11-15 LAB — BASIC METABOLIC PANEL WITH GFR
Anion gap: 11 (ref 5–15)
BUN: 25 mg/dL — ABNORMAL HIGH (ref 8–23)
CO2: 24 mmol/L (ref 22–32)
Calcium: 8.8 mg/dL — ABNORMAL LOW (ref 8.9–10.3)
Chloride: 102 mmol/L (ref 98–111)
Creatinine, Ser: 1.12 mg/dL — ABNORMAL HIGH (ref 0.44–1.00)
GFR, Estimated: 51 mL/min — ABNORMAL LOW (ref >60)
Glucose, Bld: 137 mg/dL — ABNORMAL HIGH (ref 70–99)
Potassium: 4.3 mmol/L (ref 3.5–5.1)
Sodium: 137 mmol/L (ref 135–145)

## 2023-11-15 LAB — BRAIN NATRIURETIC PEPTIDE: B Natriuretic Peptide: 44 pg/mL (ref 0.0–100.0)

## 2023-11-15 LAB — TROPONIN I (HIGH SENSITIVITY)
Troponin I (High Sensitivity): 6 ng/L (ref <18)
Troponin I (High Sensitivity): 5 ng/L (ref <18)

## 2023-11-15 LAB — HEMOGLOBIN A1C
Hgb A1c MFr Bld: 6.7 % — ABNORMAL HIGH (ref 4.8–5.6)
Mean Plasma Glucose: 145.59 mg/dL

## 2023-11-15 MED ORDER — FENTANYL 75 MCG/HR TD PT72
1.0000 | MEDICATED_PATCH | TRANSDERMAL | Status: DC
  Administered 2023-11-17: 1 via TRANSDERMAL
  Filled 2023-11-15: qty 1

## 2023-11-15 MED ORDER — GUAIFENESIN ER 600 MG PO TB12
600.0000 mg | ORAL_TABLET | Freq: Two times a day (BID) | ORAL | Status: DC
Start: 2023-11-15 — End: 2023-11-17
  Administered 2023-11-15 – 2023-11-17 (×5): 600 mg via ORAL
  Filled 2023-11-15 (×5): qty 1

## 2023-11-15 MED ORDER — LORAZEPAM 1 MG PO TABS
1.0000 mg | ORAL_TABLET | Freq: Once | ORAL | Status: AC
  Administered 2023-11-15: 1 mg via ORAL
  Filled 2023-11-15: qty 1

## 2023-11-15 MED ORDER — CEFTRIAXONE SODIUM 2 G IJ SOLR
2.0000 g | INTRAMUSCULAR | Status: DC
  Administered 2023-11-16 – 2023-11-17 (×2): 2 g via INTRAVENOUS
  Filled 2023-11-15 (×2): qty 20

## 2023-11-15 MED ORDER — IOHEXOL 350 MG/ML SOLN
100.0000 mL | Freq: Once | INTRAVENOUS | Status: AC | PRN
  Administered 2023-11-15: 100 mL via INTRAVENOUS

## 2023-11-15 MED ORDER — APIXABAN 5 MG PO TABS
5.0000 mg | ORAL_TABLET | Freq: Two times a day (BID) | ORAL | Status: DC
  Administered 2023-11-15 – 2023-11-17 (×5): 5 mg via ORAL
  Filled 2023-11-15 (×5): qty 1

## 2023-11-15 MED ORDER — ONDANSETRON HCL 4 MG PO TABS: 4.0000 mg | ORAL_TABLET | Freq: Four times a day (QID) | ORAL | Status: DC | PRN

## 2023-11-15 MED ORDER — METHYLPREDNISOLONE SODIUM SUCC 125 MG IJ SOLR
125.0000 mg | Freq: Once | INTRAMUSCULAR | Status: AC
  Administered 2023-11-15: 125 mg via INTRAVENOUS
  Filled 2023-11-15: qty 2

## 2023-11-15 MED ORDER — SODIUM CHLORIDE 0.9 % IV SOLN
1.0000 g | Freq: Once | INTRAVENOUS | Status: AC
  Administered 2023-11-15: 1 g via INTRAVENOUS
  Filled 2023-11-15: qty 10

## 2023-11-15 MED ORDER — IPRATROPIUM-ALBUTEROL 0.5-2.5 (3) MG/3ML IN SOLN
3.0000 mL | Freq: Four times a day (QID) | RESPIRATORY_TRACT | Status: DC
  Administered 2023-11-15 (×3): 3 mL via RESPIRATORY_TRACT
  Filled 2023-11-15 (×3): qty 3

## 2023-11-15 MED ORDER — SODIUM CHLORIDE 0.9 % IV SOLN
500.0000 mg | INTRAVENOUS | Status: DC
  Administered 2023-11-15 – 2023-11-17 (×3): 500 mg via INTRAVENOUS
  Filled 2023-11-15 (×3): qty 5

## 2023-11-15 MED ORDER — OXYCODONE HCL 5 MG PO TABS: 5.0000 mg | ORAL_TABLET | ORAL | Status: DC | PRN

## 2023-11-15 MED ORDER — LEVOTHYROXINE SODIUM 50 MCG PO TABS
50.0000 ug | ORAL_TABLET | Freq: Every day | ORAL | Status: DC
  Administered 2023-11-15 – 2023-11-17 (×3): 50 ug via ORAL
  Filled 2023-11-15 (×3): qty 1

## 2023-11-15 MED ORDER — INSULIN ASPART 100 UNIT/ML IJ SOLN
0.0000 [IU] | Freq: Three times a day (TID) | INTRAMUSCULAR | Status: DC
  Administered 2023-11-15: 11 [IU] via SUBCUTANEOUS
  Administered 2023-11-15: 7 [IU] via SUBCUTANEOUS
  Administered 2023-11-15: 4 [IU] via SUBCUTANEOUS
  Administered 2023-11-16: 7 [IU] via SUBCUTANEOUS
  Administered 2023-11-16: 4 [IU] via SUBCUTANEOUS
  Administered 2023-11-16: 11 [IU] via SUBCUTANEOUS
  Administered 2023-11-17: 3 [IU] via SUBCUTANEOUS
  Filled 2023-11-15 (×7): qty 1

## 2023-11-15 MED ORDER — CITALOPRAM HYDROBROMIDE 20 MG PO TABS
20.0000 mg | ORAL_TABLET | Freq: Every day | ORAL | Status: DC
Start: 2023-11-15 — End: 2023-11-17
  Administered 2023-11-15 – 2023-11-17 (×3): 20 mg via ORAL
  Filled 2023-11-15 (×3): qty 1

## 2023-11-15 MED ORDER — DAPAGLIFLOZIN PROPANEDIOL 10 MG PO TABS
10.0000 mg | ORAL_TABLET | Freq: Every day | ORAL | Status: DC
  Administered 2023-11-15 – 2023-11-17 (×3): 10 mg via ORAL
  Filled 2023-11-15 (×4): qty 1

## 2023-11-15 MED ORDER — SODIUM CHLORIDE 0.9 % IV SOLN
500.0000 mg | Freq: Once | INTRAVENOUS | Status: DC
  Filled 2023-11-15: qty 5

## 2023-11-15 MED ORDER — ROSUVASTATIN CALCIUM 10 MG PO TABS
20.0000 mg | ORAL_TABLET | Freq: Every evening | ORAL | Status: DC
  Administered 2023-11-15 – 2023-11-16 (×2): 20 mg via ORAL
  Filled 2023-11-15 (×2): qty 2

## 2023-11-15 MED ORDER — FLUCONAZOLE 50 MG PO TABS
150.0000 mg | ORAL_TABLET | Freq: Every day | ORAL | Status: AC
  Administered 2023-11-15 – 2023-11-16 (×2): 150 mg via ORAL
  Filled 2023-11-15 (×2): qty 1

## 2023-11-15 MED ORDER — FENTANYL 75 MCG/HR TD PT72: 1.0000 | MEDICATED_PATCH | TRANSDERMAL | Status: DC

## 2023-11-15 MED ORDER — METHYLPREDNISOLONE SODIUM SUCC 125 MG IJ SOLR
80.0000 mg | Freq: Two times a day (BID) | INTRAMUSCULAR | Status: AC
  Administered 2023-11-15: 80 mg via INTRAVENOUS
  Filled 2023-11-15: qty 2

## 2023-11-15 MED ORDER — PREDNISONE 20 MG PO TABS
40.0000 mg | ORAL_TABLET | Freq: Every day | ORAL | Status: DC
  Administered 2023-11-16 – 2023-11-17 (×2): 40 mg via ORAL
  Filled 2023-11-15 (×2): qty 2

## 2023-11-15 MED ORDER — IPRATROPIUM-ALBUTEROL 0.5-2.5 (3) MG/3ML IN SOLN
3.0000 mL | Freq: Three times a day (TID) | RESPIRATORY_TRACT | Status: DC
  Administered 2023-11-16 – 2023-11-17 (×4): 3 mL via RESPIRATORY_TRACT
  Filled 2023-11-15 (×4): qty 3

## 2023-11-15 MED ORDER — ACETAMINOPHEN 325 MG PO TABS: 650.0000 mg | ORAL_TABLET | Freq: Four times a day (QID) | ORAL | Status: DC | PRN

## 2023-11-15 MED ORDER — AMIODARONE HCL 200 MG PO TABS
400.0000 mg | ORAL_TABLET | Freq: Every day | ORAL | Status: DC
  Administered 2023-11-15 – 2023-11-17 (×3): 400 mg via ORAL
  Filled 2023-11-15 (×3): qty 2

## 2023-11-15 MED ORDER — IPRATROPIUM-ALBUTEROL 0.5-2.5 (3) MG/3ML IN SOLN
3.0000 mL | Freq: Once | RESPIRATORY_TRACT | Status: AC
  Administered 2023-11-15: 3 mL via RESPIRATORY_TRACT
  Filled 2023-11-15: qty 3

## 2023-11-15 MED ORDER — SODIUM CHLORIDE 0.9 % IV SOLN: 2.0000 g | INTRAVENOUS | Status: DC

## 2023-11-15 MED ORDER — ONDANSETRON HCL 4 MG/2ML IJ SOLN: 4.0000 mg | Freq: Four times a day (QID) | INTRAMUSCULAR | Status: DC | PRN

## 2023-11-15 MED ORDER — ALBUTEROL SULFATE (2.5 MG/3ML) 0.083% IN NEBU: 2.5000 mg | INHALATION_SOLUTION | RESPIRATORY_TRACT | Status: DC | PRN

## 2023-11-15 MED ORDER — PANTOPRAZOLE SODIUM 40 MG PO TBEC
40.0000 mg | DELAYED_RELEASE_TABLET | Freq: Every day | ORAL | Status: DC
  Administered 2023-11-15 – 2023-11-17 (×3): 40 mg via ORAL
  Filled 2023-11-15 (×3): qty 1

## 2023-11-15 MED ORDER — PNEUMOCOCCAL 20-VAL CONJ VACC 0.5 ML IM SUSY
0.5000 mL | PREFILLED_SYRINGE | INTRAMUSCULAR | Status: DC
Start: 2023-11-16 — End: 2023-11-16

## 2023-11-15 MED ORDER — RIFAXIMIN 550 MG PO TABS
550.0000 mg | ORAL_TABLET | Freq: Two times a day (BID) | ORAL | Status: DC
  Administered 2023-11-15 – 2023-11-17 (×5): 550 mg via ORAL
  Filled 2023-11-15 (×6): qty 1

## 2023-11-15 MED ORDER — ACETAMINOPHEN 650 MG RE SUPP
650.0000 mg | Freq: Four times a day (QID) | RECTAL | Status: DC | PRN
Start: 2023-11-15 — End: 2023-11-17

## 2023-11-15 MED ORDER — INSULIN ASPART 100 UNIT/ML IJ SOLN
0.0000 [IU] | Freq: Every day | INTRAMUSCULAR | Status: DC
  Administered 2023-11-15 – 2023-11-16 (×2): 2 [IU] via SUBCUTANEOUS
  Filled 2023-11-15 (×2): qty 1

## 2023-11-15 NOTE — Plan of Care (Signed)

## 2023-11-15 NOTE — Plan of Care (Signed)
  Problem: Education: Goal: Ability to describe self-care measures that may prevent or decrease complications (Diabetes Survival Skills Education) will improve Outcome: Progressing Goal: Individualized Educational Video(s) Outcome: Progressing   Problem: Coping: Goal: Ability to adjust to condition or change in health will improve Outcome: Progressing   Problem: Fluid Volume: Goal: Ability to maintain a balanced intake and output will improve Outcome: Progressing   Problem: Health Behavior/Discharge Planning: Goal: Ability to identify and utilize available resources and services will improve Outcome: Progressing Goal: Ability to manage health-related needs will improve Outcome: Progressing   Problem: Metabolic: Goal: Ability to maintain appropriate glucose levels will improve Outcome: Progressing   Problem: Nutritional: Goal: Maintenance of adequate nutrition will improve Outcome: Progressing Goal: Progress toward achieving an optimal weight will improve Outcome: Progressing   Problem: Skin Integrity: Goal: Risk for impaired skin integrity will decrease Outcome: Progressing   Problem: Education: Goal: Knowledge of General Education information will improve Description: Including pain rating scale, medication(s)/side effects and non-pharmacologic comfort measures Outcome: Progressing   Problem: Tissue Perfusion: Goal: Adequacy of tissue perfusion will improve Outcome: Progressing   Problem: Health Behavior/Discharge Planning: Goal: Ability to manage health-related needs will improve Outcome: Progressing   Problem: Clinical Measurements: Goal: Ability to maintain clinical measurements within normal limits will improve Outcome: Progressing Goal: Will remain free from infection Outcome: Progressing Goal: Diagnostic test results will improve Outcome: Progressing Goal: Respiratory complications will improve Outcome: Progressing Goal: Cardiovascular complication will  be avoided Outcome: Progressing   Problem: Activity: Goal: Risk for activity intolerance will decrease Outcome: Progressing   Problem: Nutrition: Goal: Adequate nutrition will be maintained Outcome: Progressing

## 2023-11-15 NOTE — Assessment & Plan Note (Signed)
 Continue home fentanyl , oxycodone , gabapentin  and cyclobenzaprine 

## 2023-11-15 NOTE — Assessment & Plan Note (Signed)
 Follow-up per radiologist recommendation

## 2023-11-15 NOTE — ED Provider Notes (Signed)
 [facility] Provider Note    Event Date/Time   First MD Initiated Contact with Patient 11/15/23 0131     (approximate)  History   Chief Complaint: Dyspnea  HPI  Stephanie Small is a 78 y.o. female with a past medical history of COPD on 2 L chronically, diabetes, hypertension, presents to the emergency department for shortness of breath.  According to the patient approximately month ago she was admitted to the hospital for influenza requiring a nearly 2-week admission.  Patient has been home for approximately 2 weeks states she has been doing well until tonight.  Patient states she awoke from her sleep feeling short of breath like she could not get a full deep breath.  Patient denies any chest pain.  Patient denies any recent fever cough or congestion.  Physical Exam   Triage Vital Signs: ED Triage Vitals  Encounter Vitals Group     BP      Systolic BP Percentile      Diastolic BP Percentile      Pulse      Resp      Temp      Temp src      SpO2      Weight      Height      Head Circumference      Peak Flow      Pain Score      Pain Loc      Pain Education      Exclude from Growth Chart     Most recent vital signs: There were no vitals filed for this visit.  General: Awake, mild respiratory distress with tachypnea speaking in 2-3 word sentences. CV:  Good peripheral perfusion.  Regular rate and rhythm  Resp:  Mild tachypnea.  Clear lung sounds without wheeze rales or rhonchi.  Good air movement. Abd:  No distention.  Soft, nontender.  No rebound or guarding. Other:  No significant lower extremity edema.  ED Results / Procedures / Treatments   EKG  EKG viewed and interpreted by myself shows a sinus rhythm at 53 bpm with a narrow QRS, normal axis, normal intervals, no concerning ST changes.  RADIOLOGY  I have reviewed and interpreted chest x-ray images.  No consolidation on my evaluation. Radiology has read asymmetric changes in left  lung base possible pneumonitis atelectasis or scarring. CTA of the chest read is negative for PE but there is interval development of groundglass opacities in the upper lobe consistent with possible infiltrate.   MEDICATIONS ORDERED IN ED: Medications  ipratropium-albuterol  (DUONEB) 0.5-2.5 (3) MG/3ML nebulizer solution 3 mL (has no administration in time range)  LORazepam  (ATIVAN ) tablet 1 mg (has no administration in time range)     IMPRESSION / MDM / ASSESSMENT AND PLAN / ED COURSE  I reviewed the triage vital signs and the nursing notes.  Patient's presentation is most consistent with acute presentation with potential threat to life or bodily function.  Patient presents the emergency department for shortness of breath.  Patient states it started acutely tonight awoke her from sleep.  Overall the patient appears well.  Moving a good amount of air with no wheeze rales or rhonchi.  We will check labs including cardiac enzymes and a BNP we will obtain a chest x-ray.  Will dose a breathing treatment as well as a small dose of oral Ativan .  Will continue to closely monitor while awaiting results.  Patient agreeable to plan of care.  Patient's  workup shows a reassuring CBC with a normal white blood cell count, overall reassuring chemistry, negative troponin x 2, normal BNP.  Patient's chest x-ray shows possible pneumonitis.  Patient received breathing treatment continues to have shortness of breath. CTA of the chest performed showing no PE but there is possible pneumonia.  Will cover with antibiotics.  Given the patient's COPD will cover with steroids will admit to the hospital service for ongoing dyspnea.  Patient requiring 3 L increased from baseline of 2 L of oxygen.  Continues to have increased work of breathing.  FINAL CLINICAL IMPRESSION(S) / ED DIAGNOSES   Dyspnea COPD exacerbation Community-acquired pneumonia  Note:  This document was prepared using Dragon voice recognition software  and may include unintentional dictation errors.   Ruth Cove, MD 11/15/23 [id number]

## 2023-11-15 NOTE — Assessment & Plan Note (Addendum)
 Acute on chronic respiratory failure with hypoxia Possible pneumonia Patient with shortness of breath, wheezing with increased O2 requirement Scheduled and as needed nebulized bronchodilators Steroids Rocephin  and azithromycin  Antitussives Supplemental oxygen to keep sats over 92%

## 2023-11-15 NOTE — Assessment & Plan Note (Addendum)
 Chronic anticoagulation Continue apixaban  and amiodarone  Holding diltiazem  due to soft blood pressure and bradycardia in the 50s

## 2023-11-15 NOTE — Assessment & Plan Note (Signed)
 Continue Celexa

## 2023-11-15 NOTE — Care Management Obs Status (Signed)
 MEDICARE OBSERVATION STATUS NOTIFICATION   Patient Details  Name: ZARAYA DELAUDER MRN: [id number] Date of Birth: 11-28-1945   Medicare Observation Status Notification Given:       [place] L Jordi Lacko, LCSW 11/15/2023, 11:06 AM

## 2023-11-15 NOTE — Assessment & Plan Note (Signed)
 Sliding scale insulin coverage

## 2023-11-15 NOTE — H&P (Signed)
 History and Physical    Patient: Stephanie Small ZOX:[id number] DOB: 14-Jan-1946 DOA: 11/15/2023 DOS: the patient was seen and examined on 11/15/2023 PCP: Trenda Frisk, FNP  Patient coming from: Home  Chief Complaint:  Chief Complaint  Patient presents with   Shortness of Breath    HPI: Stephanie Small is a 78 y.o. female with medical history significant for COPD, chronic pain, hypothyroidism, DM, HTN, , A-fib on Eliquis , with history of hospitalization from 1/29 to 08/06/2023 for respiratory failure secondary to influenza A and COPD, requiring up to 12 L HFNC weaned down to 2 L by discharge, who presents To the ED with a 1 day history of shortness of breath that acutely worsened, waking her from sleep.  She denied chest pain, lower extremity pain or swelling and has had no cough fever or chills.  She was previously at her baseline. ED course and data review: Afebrile, pulse in the 50s, tachypneic to 25 requiring 3 L to maintain sats in the mid to high 90s.  BP 105/58 Labs notable for normal troponin of 5 and BNP 44 CBC completely normal BMP notable for creatinine 1.12 above baseline of 0.75  EKG, personally viewed and interpreted showing sinus bradycardia at 53  CTA chest negative for PE groundglass pulmonary infiltrate possibly infectious or inflammatory as well as a pulmonary nodule multivessel artery calcification   patient treated with DuoNebs and Solu-Medrol  started on Rocephin  and azithromycin  but continued to have increased work of breathing.  She also got Ativan  for anxiety  hospitalist consulted for admission     Review of Systems: As mentioned in the history of present illness. All other systems reviewed and are negative.  Past Medical History:  Diagnosis Date   Arthritis    Atypical squamous cells of undetermined significance (ASCUS) on Papanicolaou smear of cervix 10/08/2016   Chest pain 09/20/2020   COPD (chronic obstructive pulmonary disease) (HCC)     Diabetes mellitus without complication (HCC)    Diarrhea 01/09/2021   Elevated troponin    Glaucoma 2010   History of adenomatous polyp of colon    Hypertension 2005   Hypomagnesemia 01/09/2021   Knee pain 05/06/2022   Low back pain of over 3 months duration 05/06/2022   Personal history of colonic polyps    Respiratory distress 10/25/2015   Sepsis (HCC) 01/09/2021   Thyroid  cyst    UTI (urinary tract infection) 01/09/2021   Wheezing 10/25/2015   Past Surgical History:  Procedure Laterality Date   CAROTID STENT INSERTION  2011   CHOLECYSTECTOMY  2009   COLONOSCOPY  2007   COLONOSCOPY WITH PROPOFOL  N/A 08/18/2016   Procedure: COLONOSCOPY WITH PROPOFOL ;  Surgeon: Marnee Sink, MD;  Location: [facility];  Service: Endoscopy;  Laterality: N/A;   FOOT SURGERY     SHOULDER SURGERY Bilateral 2004   UPPER GI ENDOSCOPY     Social History:  reports that she has been smoking e-cigarettes. She has never used smokeless tobacco. She reports that she does not drink alcohol and does not use drugs.  Allergies  Allergen Reactions   Shellfish-Derived Products Anaphylaxis and Rash    Allergic to oysters, not sure if she's allergic to shellfish.  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   Shellfish Allergy Rash    Family History  Problem Relation Age of Onset   Hypertension Mother     Prior to Admission medications   Medication Sig Start Date End Date Taking? Authorizing Provider  albuterol  (PROVENTIL ) (2.5 MG/3ML) 0.083% nebulizer solution Take 3 mLs (2.5 mg total) by nebulization every 4 (four) hours as needed for wheezing or shortness of breath. 08/14/23 08/13/24  Trenda Frisk, FNP  amiodarone  (PACERONE ) 400 MG tablet Take 1 tablet (400 mg total) by mouth daily. 10/01/23   Trenda Frisk, FNP  amiodarone  (PACERONE ) 400 MG tablet Take 1 tablet (400 mg total) by mouth daily. 10/04/23   Trenda Frisk, FNP  apixaban  (ELIQUIS ) 5 MG TABS tablet TAKE 1 TABLET  BY MOUTH TWICE DAILY 07/23/23   Aisha Hove, MD  citalopram  (CELEXA ) 20 MG tablet TAKE 1 TABLET BY MOUTH ONCE DAILY 09/29/23   Trenda Frisk, FNP  cyclobenzaprine  (FLEXERIL ) 5 MG tablet Take 5 mg by mouth 3 (three) times daily as needed for muscle spasms. 11/23/20   [provider]  dapagliflozin  propanediol (FARXIGA ) 10 MG TABS tablet Take 1 tablet (10 mg total) by mouth daily. 08/14/23   Trenda Frisk, FNP  diclofenac  Sodium (VOLTAREN ) 1 % GEL Apply 1 Application topically 4 (four) times daily.    [provider]  diltiazem  (CARDIZEM  CD) 360 MG 24 hr capsule Take 1 capsule (360 mg total) by mouth daily. Patient not taking: Reported on 08/14/2023 08/07/23   Darus Engels A, DO  fentaNYL  (DURAGESIC ) 75 MCG/HR Place 1 patch onto the skin every 3 (three) days.    [provider]  fluticasone  (FLONASE ) 50 MCG/ACT nasal spray Place 2 sprays into both nostrils daily. 08/07/23   Montey Apa, DO  fluticasone  furoate-vilanterol (BREO ELLIPTA ) 100-25 MCG/INH AEPB Inhale 1 puff into the lungs daily as needed. Patient not taking: Reported on 08/14/2023    [provider]  furosemide  (LASIX ) 20 MG tablet TAKE 1 TABLET BY MOUTH ONCE DAILY 11/17/22   Aisha Hove, MD  gabapentin  (NEURONTIN ) 400 MG capsule Take 400 mg by mouth 3 (three) times daily.    [provider]  glucose blood (ONETOUCH VERIO) test strip Use as instructed 03/31/23   Trenda Frisk, FNP  Ipratropium-Albuterol  (COMBIVENT ) 20-100 MCG/ACT AERS respimat Inhale 1 puff into the lungs 3 (three) times daily. Patient not taking: Reported on 08/14/2023 08/06/23   Darus Engels A, DO  isosorbide  mononitrate (IMDUR ) 30 MG 24 hr tablet TAKE 1 TABLET BY MOUTH ONCE DAILY 08/27/23   Shirley, Amanda M, FNP  levothyroxine  (SYNTHROID ) 50 MCG tablet Take 1 tablet (50 mcg total) by mouth daily at 6 (six) AM. 11/02/23   Trenda Frisk, FNP  lidocaine  (LIDODERM ) 5 % Place 1 patch onto the skin daily as needed.  Pt has not been using as often due to other meds, only using PRN Patient not taking: Reported on 08/14/2023 06/16/23   [provider]  losartan  (COZAAR ) 50 MG tablet TAKE 1 TABLET BY MOUTH ONCE DAILY 08/27/23   Trenda Frisk, FNP  naloxone [facility]) nasal spray 4 mg/0.1 mL Place 1 spray into the nose once. Patient not taking: Reported on 08/14/2023    [provider]  nystatin  (MYCOSTATIN ) 100000 UNIT/ML suspension Take 5 mLs (500,000 Units total) by mouth 4 (four) times daily. 08/06/23   Montey Apa, DO  ofloxacin  (FLOXIN ) 0.3 % OTIC solution Place 5 drops into both ears 2 (two) times daily. 09/29/23   Scoggins, Amber, NP  omeprazole (PRILOSEC) 40 MG capsule TAKE 1 CAPSULE BY MOUTH ONCE DAILY 08/27/23   Trenda Frisk, FNP  ondansetron  (ZOFRAN ) 4 MG tablet Take 4 mg by mouth every 8 (eight)  hours as needed. 07/28/23   [provider]  Oxycodone  HCl 10 MG TABS Take 10 mg by mouth daily.    [provider]  rifaximin  (XIFAXAN ) 550 MG TABS tablet TAKE 1 TABLET BY MOUTH TWICE DAILY 08/27/23   Shirley, Amanda M, FNP  rosuvastatin  (CRESTOR ) 20 MG tablet TAKE 1 TABLET BY MOUTH ONCE EVERY EVENING 08/27/23   Trenda Frisk, FNP  sitaGLIPtin (JANUVIA) 100 MG tablet TAKE 1 TABLET BY MOUTH ONCE DAILY 07/23/23   Aisha Hove, MD  SUMAtriptan  (IMITREX ) 100 MG tablet TAKE 1 TABLET BY MOUTH AS NEEDED FOR HEADACHE. MAX 2 TABLETS PER DAY 02/02/23   Trenda Frisk, FNP  tolterodine (DETROL LA) 2 MG 24 hr capsule TAKE 1 CAPSULE BY MOUTH ONCE DAILY 05/21/23   Aisha Hove, MD  vitamin C (ASCORBIC ACID ) 500 MG tablet Take 500 mg by mouth daily. Patient not taking: Reported on 08/14/2023    [provider]  Vonoprazan Fumarate  (VOQUEZNA ) 20 MG TABS Take 20 mg by mouth daily. 10/01/23   Trenda Frisk, FNP  Vonoprazan Fumarate  (VOQUEZNA ) 20 MG TABS Take 20 mg by mouth daily.    [provider]    Physical Exam: Vitals:   11/15/23 0142 11/15/23 0143  11/15/23 0144 11/15/23 0148  BP:    (!) 105/58  Pulse:  (!) 56    Resp:    (!) 25  Temp:  98.3 F (36.8 C)    TempSrc:  Oral    SpO2: 95% 96%    Weight:   99.8 kg   Height:   5\' 4"  (1.626 m)    Physical Exam Vitals and nursing note reviewed.  Constitutional:      General: She is not in acute distress.    Comments: Conversational dyspnea, speaking in short sentences, increased work of breathing  HENT:     Head: Normocephalic and atraumatic.  Cardiovascular:     Rate and Rhythm: Regular rhythm. Bradycardia present.     Heart sounds: Normal heart sounds.  Pulmonary:     Effort: Tachypnea present.     Breath sounds: Normal breath sounds.  Abdominal:     Palpations: Abdomen is soft.     Tenderness: There is no abdominal tenderness.  Neurological:     Mental Status: Mental status is at baseline.     Labs on Admission: I have personally reviewed following labs and imaging studies  CBC: Recent Labs  Lab 11/15/23 0213  WBC 6.4  HGB 12.4  HCT 38.0  MCV 94.1  PLT 225   Basic Metabolic Panel: Recent Labs  Lab 11/15/23 0213  NA 137  K 4.3  CL 102  CO2 24  GLUCOSE 137*  BUN 25*  CREATININE 1.12*  CALCIUM  8.8*   GFR: Estimated Creatinine Clearance: 49.8 mL/min (A) (by C-G formula based on SCr of 1.12 mg/dL (H)). Liver Function Tests: No results for input(s): "AST", "ALT", "ALKPHOS", "BILITOT", "PROT", "ALBUMIN" in the last 168 hours. No results for input(s): "LIPASE", "AMYLASE" in the last 168 hours. No results for input(s): "AMMONIA" in the last 168 hours. Coagulation Profile: No results for input(s): "INR", "PROTIME" in the last 168 hours. Cardiac Enzymes: No results for input(s): "CKTOTAL", "CKMB", "CKMBINDEX", "TROPONINI" in the last 168 hours. BNP (last 3 results) No results for input(s): "PROBNP" in the last 8760 hours. HbA1C: No results for input(s): "HGBA1C" in the last 72 hours. CBG: No results for input(s): "GLUCAP" in the last 168 hours. Lipid  Profile: No results  for input(s): "CHOL", "HDL", "LDLCALC", "TRIG", "CHOLHDL", "LDLDIRECT" in the last 72 hours. Thyroid  Function Tests: No results for input(s): "TSH", "T4TOTAL", "FREET4", "T3FREE", "THYROIDAB" in the last 72 hours. Anemia Panel: No results for input(s): "VITAMINB12", "FOLATE", "FERRITIN", "TIBC", "IRON", "RETICCTPCT" in the last 72 hours. Urine analysis:    Component Value Date/Time   COLORURINE AMBER (A) 01/08/2021 2351   APPEARANCEUR CLOUDY (A) 01/08/2021 2351   LABSPEC 1.006 01/08/2021 2351   PHURINE 5.0 01/08/2021 2351   GLUCOSEU NEGATIVE 01/08/2021 2351   HGBUR NEGATIVE 01/08/2021 2351   BILIRUBINUR NEGATIVE 01/08/2021 2351   KETONESUR NEGATIVE 01/08/2021 2351   PROTEINUR NEGATIVE 01/08/2021 2351   NITRITE NEGATIVE 01/08/2021 2351   LEUKOCYTESUR MODERATE (A) 01/08/2021 2351    Radiological Exams on Admission: CT Angio Chest PE W and/or Wo Contrast Result Date: 11/15/2023 CLINICAL DATA:  High probability pulmonary embolism EXAM: CT ANGIOGRAPHY CHEST WITH CONTRAST TECHNIQUE: Multidetector CT imaging of the chest was performed using the standard protocol during bolus administration of intravenous contrast. Multiplanar CT image reconstructions and MIPs were obtained to evaluate the vascular anatomy. RADIATION DOSE REDUCTION: This exam was performed according to the departmental dose-optimization program which includes automated exposure control, adjustment of the mA and/or kV according to patient size and/or use of iterative reconstruction technique. CONTRAST:  OMNIPAQUE  IOHEXOL  350 MG/ML SOLN COMPARISON:  07/26/2021 FINDINGS: Cardiovascular: Adequate opacification of the pulmonary arterial tree. No intraluminal filling defect identified to suggest acute pulmonary embolism. Central pulmonary arteries are dilated in keeping with changes of pulmonary arterial hypertension, unchanged. Moderate multi-vessel coronary artery calcification. Cardiac size mildly enlarged,  unchanged. No pericardial effusion. Mild thyroid  goiter atherosclerotic calcification within the thoracic aorta. No aortic aneurysm. Mediastinum/Nodes: Thyroid  goiter. No pathologic thoracic adenopathy. Esophagus unremarkable. Moderate hiatal hernia. Lungs/Pleura: 7 mm noncalcified pulmonary nodule is seen within the right lower lobe at axial image # 77/5, stable in size since prior examination with demonstrating increasing density. Scattered parenchymal scarring again seen. Superimposed diffuse slightly asymmetric and upper lobe predominant ground-glass pulmonary infiltrate has developed, possibly infectious or inflammatory in the acute setting. No pneumothorax or pleural effusion. Central airways are widely patent. Upper Abdomen: No acute abnormality. Musculoskeletal: No acute bone abnormality. No lytic or blastic bone lesion. Osseous structures are age appropriate. Review of the MIP images confirms the above findings. IMPRESSION: 1. No acute pulmonary embolism. 2. Interval development of diffuse slightly asymmetric and upper lobe predominant ground-glass pulmonary infiltrate, possibly infectious or inflammatory in the acute setting. 3. 7 mm noncalcified pulmonary nodule within the right lower lobe, stable in size since prior examination with demonstrating increasing density. Continued follow-up is warranted with Non-contrast chest CT at 6-12 months. If the nodule is stable at time of repeat CT, then future CT at 18-24 months (from today's scan) is considered optional for low-risk patients, but is recommended for high-risk patients. This recommendation follows the consensus statement: Guidelines for Management of Incidental Pulmonary Nodules Detected on CT Images: From the Fleischner Society 2017; Radiology 2017; 284:228-243. 4. Moderate multi-vessel coronary artery calcification. Stable mild cardiomegaly. 5. Morphologic changes in keeping with pulmonary arterial hypertension. 6. Moderate hiatal hernia. Aortic  Atherosclerosis (ICD10-I70.0). Electronically Signed   By: Worthy Heads M.D.   On: 11/15/2023 04:02   DG Chest Portable 1 View Result Date: 11/15/2023 CLINICAL DATA:  Shortness of breath. Admission earlier this year for flu, discharged home on supplemental oxygen. EXAM: PORTABLE CHEST 1 VIEW COMPARISON:  Portable chest 08/01/2023 FINDINGS: There is mild cardiomegaly. No vascular congestion is seen.  Mediastinum is stable with aortic atherosclerosis and tortuosity. There is asymmetric coarse interstitial change in the left lower lung field, which could be due to interstitial pneumonitis or scarring change. There is bilateral perihilar linear scarring or atelectasis. No pleural effusion. The remaining lungs are clear. There is thoracic spondylosis and mild dextroscoliosis. IMPRESSION: 1. Asymmetric coarse interstitial change in the left lower lung field, which could be due to interstitial pneumonitis or scarring change. This is similar to the last chest x-ray but developed since the portable chest from 07/29/2023. 2. Bilateral perihilar linear scarring or atelectasis. 3. Aortic atherosclerosis. 4. Mild cardiomegaly. Electronically Signed   By: Denman Fischer M.D.   On: 11/15/2023 02:47   Data Reviewed for HPI: Relevant notes from primary care and specialist visits, past discharge summaries as available in EHR, including Care Everywhere. Prior diagnostic testing as pertinent to current admission diagnoses Updated medications and problem lists for reconciliation ED course, including vitals, labs, imaging, treatment and response to treatment Triage notes, nursing and pharmacy notes and ED provider's notes Notable results as noted above in HPI      Assessment and Plan: * COPD exacerbation (HCC) Acute on chronic respiratory failure with hypoxia Possible pneumonia Patient with shortness of breath, wheezing with increased O2 requirement Scheduled and as needed nebulized  bronchodilators Steroids Rocephin  and azithromycin  Antitussives Supplemental oxygen to keep sats over 92%   Pulmonary nodule Follow-up per radiologist recommendation  Paroxysmal atrial fibrillation (HCC) Chronic anticoagulation Continue apixaban  and amiodarone  Holding diltiazem  due to soft blood pressure and bradycardia in the 50s  Anxiety Continue Celexa   Hypothyroidism Continue levothyroxine   Chronic pain syndrome Continue home fentanyl , oxycodone , gabapentin  and cyclobenzaprine   Type II diabetes mellitus (HCC) Sliding scale insulin  coverage  Essential hypertension BP soft, so will hold losartan  and furosemide  and diltiazem      DVT prophylaxis: Eliquis   Consults: none  Advance Care Planning:   Code Status: Prior   Family Communication: Daughter at bedside  Disposition Plan: Back to previous home environment  Severity of Illness: The appropriate patient status for this patient is OBSERVATION. Observation status is judged to be reasonable and necessary in order to provide the required intensity of service to ensure the patient's safety. The patient's presenting symptoms, physical exam findings, and initial radiographic and laboratory data in the context of their medical condition is felt to place them at decreased risk for further clinical deterioration. Furthermore, it is anticipated that the patient will be medically stable for discharge from the hospital within 2 midnights of admission.   Author: Lanetta Pion, MD 11/15/2023 4:37 AM  For on call review [email or web address].

## 2023-11-15 NOTE — Progress Notes (Addendum)
 PROGRESS NOTE    Stephanie Small  ZOX:[id number] DOB: Mar 10, 1946 DOA: 11/15/2023 PCP: Trenda Frisk, FNP   Assessment & Plan:   Principal Problem:   COPD exacerbation [facility]) Active Problems:   Acute on chronic respiratory failure with hypoxia (HCC)   CAP (community acquired pneumonia)   AKI (acute kidney injury) (HCC)   Essential hypertension   Type II diabetes mellitus (HCC)   Chronic pain syndrome   Hypothyroidism   Anxiety   Paroxysmal atrial fibrillation (HCC)   Chronic anticoagulation   Pulmonary nodule  Assessment and Plan: COPD exacerbation: continue on azithromycin , steroids, bronchodilators & encourage incentive spirometry   CAP: continue on IV rocephin , azithromycin , steroids, bronchodilators & encourage incentive spirometry   Acute on chronic hypoxic respiratory failure: secondary to above. Continue on supplemental oxygen and wean back to baseline as tolerated    Pulmonary nodule: will need to f/u outpatient w/ repeat CT in 6-12 months    PAF: continue on home dose of amio, eliquis . Holding diltiazem    Vaginal yeast infection: fluconazole  x 2   Depression: severity unknown. Continue on home dose of celexa     Hypothyroidism: continue on home dose of levothyroxine     Chronic pain syndrome: continue on home dose of fentanyl , oxy, gabapentin  & flexeril     DM2: likely poorly controlled. Continue on SSI w/ accuchecks    HTN: holding lasix , losartan , diltiazem    Obesity: BMI 38.9. Complicates overall care & prognosis     DVT prophylaxis:  eliquis  Code Status: full  Family Communication: discussed pt's care w/ pt's family at bedside and answered their questions Disposition Plan: likely d/c back home   Level of care: Telemetry Medical  Status is: Observation The patient remains OBS appropriate and will d/c before 2 midnights.    Consultants:    Procedures:  Antimicrobials: azithromycin , rocephin    Subjective: Pt c/o shortness of breath,  improved from day prior   Objective: Vitals:   11/15/23 0144 11/15/23 0148 11/15/23 0516 11/15/23 0534  BP:  (!) 105/58 (!) 123/57 125/66  Pulse:   (!) 58 (!) 57  Resp:  (!) 25 12 20   Temp:   97.9 F (36.6 C) 98.2 F (36.8 C)  TempSrc:   Oral   SpO2:   94% 93%  Weight: 99.8 kg   103 kg  Height: 5\' 4"  (1.626 m)   5\' 4"  (1.626 m)    Intake/Output Summary (Last 24 hours) at 11/15/2023 0813 Last data filed at 11/15/2023 0456 Gross per 24 hour  Intake 100 ml  Output --  Net 100 ml   Filed Weights   11/15/23 0144 11/15/23 0534  Weight: 99.8 kg 103 kg    Examination:  General exam: Appears calm and comfortable  Respiratory system: diminished breath sounds b/l  Cardiovascular system: S1 & S2+. No rubs, gallops or clicks. Gastrointestinal system: Abdomen is obese, soft and nontender.  Normal bowel sounds heard. Central nervous system: Alert and oriented. Moves all extremities  Psychiatry: Judgement and insight appear normal. Mood & affect appropriate.     Data Reviewed: I have personally reviewed following labs and imaging studies  CBC: Recent Labs  Lab 11/15/23 0213  WBC 6.4  HGB 12.4  HCT 38.0  MCV 94.1  PLT 225   Basic Metabolic Panel: Recent Labs  Lab 11/15/23 0213  NA 137  K 4.3  CL 102  CO2 24  GLUCOSE 137*  BUN 25*  CREATININE 1.12*  CALCIUM  8.8*   GFR: Estimated Creatinine Clearance: 50.7 mL/min (  A) (by C-G formula based on SCr of 1.12 mg/dL (H)). Liver Function Tests: No results for input(s): "AST", "ALT", "ALKPHOS", "BILITOT", "PROT", "ALBUMIN" in the last 168 hours. No results for input(s): "LIPASE", "AMYLASE" in the last 168 hours. No results for input(s): "AMMONIA" in the last 168 hours. Coagulation Profile: No results for input(s): "INR", "PROTIME" in the last 168 hours. Cardiac Enzymes: No results for input(s): "CKTOTAL", "CKMB", "CKMBINDEX", "TROPONINI" in the last 168 hours. BNP (last 3 results) No results for input(s): "PROBNP" in the  last 8760 hours. HbA1C: No results for input(s): "HGBA1C" in the last 72 hours. CBG: Recent Labs  Lab 11/15/23 0619  GLUCAP 173*   Lipid Profile: No results for input(s): "CHOL", "HDL", "LDLCALC", "TRIG", "CHOLHDL", "LDLDIRECT" in the last 72 hours. Thyroid  Function Tests: No results for input(s): "TSH", "T4TOTAL", "FREET4", "T3FREE", "THYROIDAB" in the last 72 hours. Anemia Panel: No results for input(s): "VITAMINB12", "FOLATE", "FERRITIN", "TIBC", "IRON", "RETICCTPCT" in the last 72 hours. Sepsis Labs: No results for input(s): "PROCALCITON", "LATICACIDVEN" in the last 168 hours.  No results found for this or any previous visit (from the past 240 hours).       Radiology Studies: CT Angio Chest PE W and/or Wo Contrast Result Date: 11/15/2023 CLINICAL DATA:  High probability pulmonary embolism EXAM: CT ANGIOGRAPHY CHEST WITH CONTRAST TECHNIQUE: Multidetector CT imaging of the chest was performed using the standard protocol during bolus administration of intravenous contrast. Multiplanar CT image reconstructions and MIPs were obtained to evaluate the vascular anatomy. RADIATION DOSE REDUCTION: This exam was performed according to the departmental dose-optimization program which includes automated exposure control, adjustment of the mA and/or kV according to patient size and/or use of iterative reconstruction technique. CONTRAST:  OMNIPAQUE  IOHEXOL  350 MG/ML SOLN COMPARISON:  07/26/2021 FINDINGS: Cardiovascular: Adequate opacification of the pulmonary arterial tree. No intraluminal filling defect identified to suggest acute pulmonary embolism. Central pulmonary arteries are dilated in keeping with changes of pulmonary arterial hypertension, unchanged. Moderate multi-vessel coronary artery calcification. Cardiac size mildly enlarged, unchanged. No pericardial effusion. Mild thyroid  goiter atherosclerotic calcification within the thoracic aorta. No aortic aneurysm. Mediastinum/Nodes:  Thyroid  goiter. No pathologic thoracic adenopathy. Esophagus unremarkable. Moderate hiatal hernia. Lungs/Pleura: 7 mm noncalcified pulmonary nodule is seen within the right lower lobe at axial image # 77/5, stable in size since prior examination with demonstrating increasing density. Scattered parenchymal scarring again seen. Superimposed diffuse slightly asymmetric and upper lobe predominant ground-glass pulmonary infiltrate has developed, possibly infectious or inflammatory in the acute setting. No pneumothorax or pleural effusion. Central airways are widely patent. Upper Abdomen: No acute abnormality. Musculoskeletal: No acute bone abnormality. No lytic or blastic bone lesion. Osseous structures are age appropriate. Review of the MIP images confirms the above findings. IMPRESSION: 1. No acute pulmonary embolism. 2. Interval development of diffuse slightly asymmetric and upper lobe predominant ground-glass pulmonary infiltrate, possibly infectious or inflammatory in the acute setting. 3. 7 mm noncalcified pulmonary nodule within the right lower lobe, stable in size since prior examination with demonstrating increasing density. Continued follow-up is warranted with Non-contrast chest CT at 6-12 months. If the nodule is stable at time of repeat CT, then future CT at 18-24 months (from today's scan) is considered optional for low-risk patients, but is recommended for high-risk patients. This recommendation follows the consensus statement: Guidelines for Management of Incidental Pulmonary Nodules Detected on CT Images: From the Fleischner Society 2017; Radiology 2017; 284:228-243. 4. Moderate multi-vessel coronary artery calcification. Stable mild cardiomegaly. 5. Morphologic changes in keeping with  pulmonary arterial hypertension. 6. Moderate hiatal hernia. Aortic Atherosclerosis (ICD10-I70.0). Electronically Signed   By: Worthy Heads M.D.   On: 11/15/2023 04:02   DG Chest Portable 1 View Result Date:  11/15/2023 CLINICAL DATA:  Shortness of breath. Admission earlier this year for flu, discharged home on supplemental oxygen. EXAM: PORTABLE CHEST 1 VIEW COMPARISON:  Portable chest 08/01/2023 FINDINGS: There is mild cardiomegaly. No vascular congestion is seen. Mediastinum is stable with aortic atherosclerosis and tortuosity. There is asymmetric coarse interstitial change in the left lower lung field, which could be due to interstitial pneumonitis or scarring change. There is bilateral perihilar linear scarring or atelectasis. No pleural effusion. The remaining lungs are clear. There is thoracic spondylosis and mild dextroscoliosis. IMPRESSION: 1. Asymmetric coarse interstitial change in the left lower lung field, which could be due to interstitial pneumonitis or scarring change. This is similar to the last chest x-ray but developed since the portable chest from 07/29/2023. 2. Bilateral perihilar linear scarring or atelectasis. 3. Aortic atherosclerosis. 4. Mild cardiomegaly. Electronically Signed   By: Denman Fischer M.D.   On: 11/15/2023 02:47        Scheduled Meds:  amiodarone   400 mg Oral Daily   apixaban   5 mg Oral BID   citalopram   20 mg Oral Daily   dapagliflozin  propanediol  10 mg Oral Daily   [START ON 11/18/2023] fentaNYL   1 patch Transdermal Q72H   guaiFENesin   600 mg Oral BID   insulin  aspart  0-20 Units Subcutaneous TID WC   insulin  aspart  0-5 Units Subcutaneous QHS   ipratropium-albuterol   3 mL Nebulization Q6H   levothyroxine   50 mcg Oral Q0600   methylPREDNISolone  (SOLU-MEDROL ) injection  80 mg Intravenous Q12H   Followed by   Cecily Cohen ON 11/16/2023] predniSONE   40 mg Oral Q breakfast   pantoprazole   40 mg Oral Daily   [START ON 11/16/2023] pneumococcal 20-valent conjugate vaccine  0.5 mL Intramuscular Tomorrow-1000   rifaximin   550 mg Oral BID   rosuvastatin   20 mg Oral QPM   Continuous Infusions:  azithromycin  500 mg (11/15/23 1610)   cefTRIAXone  (ROCEPHIN )  IV     Followed  by   Cecily Cohen ON 11/16/2023] cefTRIAXone  (ROCEPHIN )  IV       LOS: 0 days     Alphonsus Jeans, MD [facility] Pager [phone]  If 7PM-7AM, please contact night-coverage [email or web address] 11/15/2023, 8:13 AM

## 2023-11-15 NOTE — Hospital Course (Signed)
 Stephanie Small

## 2023-11-15 NOTE — Assessment & Plan Note (Signed)
 Continue levothyroxine 

## 2023-11-15 NOTE — ED Triage Notes (Addendum)
 Pt arrives via EMS from home with c/o [facility] that started 30 min ago causing her to wake up. Diagnosed with the flu x 1 month ago and was admitted to the hospital. Discharged home on O2 2 LPM via [place]. PMH: COPD Pt tachypnic with dyspnea while resting in triage at this time. Sats 95% on 2 L. Speaking in short sentences.

## 2023-11-15 NOTE — Assessment & Plan Note (Signed)
 BP soft, so will hold losartan  and furosemide  and diltiazem 

## 2023-11-16 DIAGNOSIS — J441 Chronic obstructive pulmonary disease with (acute) exacerbation: Secondary | ICD-10-CM | POA: Diagnosis not present

## 2023-11-16 LAB — GLUCOSE, CAPILLARY
Glucose-Capillary: 277 mg/dL — ABNORMAL HIGH (ref 70–99)
Glucose-Capillary: 223 mg/dL — ABNORMAL HIGH (ref 70–99)
Glucose-Capillary: 198 mg/dL — ABNORMAL HIGH (ref 70–99)
Glucose-Capillary: 240 mg/dL — ABNORMAL HIGH (ref 70–99)
Glucose-Capillary: 211 mg/dL — ABNORMAL HIGH (ref 70–99)

## 2023-11-16 LAB — BASIC METABOLIC PANEL WITH GFR
Anion gap: 6 (ref 5–15)
BUN: 28 mg/dL — ABNORMAL HIGH (ref 8–23)
CO2: 26 mmol/L (ref 22–32)
Calcium: 9.2 mg/dL (ref 8.9–10.3)
Chloride: 106 mmol/L (ref 98–111)
Creatinine, Ser: 0.94 mg/dL (ref 0.44–1.00)
GFR, Estimated: >60 mL/min (ref >60)
Glucose, Bld: 197 mg/dL — ABNORMAL HIGH (ref 70–99)
Potassium: 4.2 mmol/L (ref 3.5–5.1)
Sodium: 138 mmol/L (ref 135–145)

## 2023-11-16 NOTE — Plan of Care (Signed)
   Problem: Education: Goal: Ability to describe self-care measures that may prevent or decrease complications (Diabetes Survival Skills Education) will improve Outcome: Progressing

## 2023-11-16 NOTE — TOC Initial Note (Signed)
 Transition of Care [facility]) - Initial/Assessment Note    Patient Details  Name: Stephanie Small MRN: [id number] Date of Birth: 09-15-1945  [facility] CM/SW Contact:    Loman Risk, RN Phone Number: 11/16/2023, 4:25 PM  Clinical Narrative:                  Admitted QVZ:DGLO Admitted from: Home with daughter and son in law PCP: [facility]  Current home health/prior home health/DME: O2, cane, RW       Therapy recommending home health.  Discussed with patient and son at bedside.  Patient in agreement and states she does not have a preference of agency.  Referral made to [facility] with [facility].  Patient states that daughter will transport at discharge and bring portable o2    Patient Goals and CMS Choice            Expected Discharge Plan and Services                                              Prior Living Arrangements/Services                       Activities of Daily Living   ADL Screening (condition at time of admission) Independently performs ADLs?: Yes (appropriate for developmental age) Is the patient deaf or have difficulty hearing?: No Does the patient have difficulty seeing, even when wearing glasses/contacts?: No Does the patient have difficulty concentrating, remembering, or making decisions?: Yes  Permission Sought/Granted                  Emotional Assessment              Admission diagnosis:  COPD exacerbation (HCC) [J44.1] Community acquired pneumonia, unspecified laterality [J18.9] Patient Active Problem List   Diagnosis Date Noted   Paroxysmal atrial fibrillation (HCC) 11/15/2023   Chronic anticoagulation 11/15/2023   Acute on chronic respiratory failure with hypoxia (HCC) 11/15/2023   CAP (community acquired pneumonia) 11/15/2023   Pulmonary nodule 11/15/2023   Acute otitis media 09/28/2023   Thrush 08/04/2023   Hyperkalemia 08/02/2023   Generalized abdominal pain 08/02/2023   Anxiety  08/02/2023   Myocardial injury 07/30/2023   Hyponatremia 07/30/2023   Acute respiratory failure with hypoxia and hypercapnia (HCC) 07/29/2023   Hypotension 07/29/2023   Influenza A 07/29/2023   AKI (acute kidney injury) (HCC) 07/29/2023   Hypothyroidism 07/29/2023   HLD (hyperlipidemia) 01/09/2021   Depression 01/09/2021   Atrial fibrillation with rapid ventricular response (HCC) 01/09/2021   Dilated cbd, acquired 03/18/2017   Pain medication agreement signed 03/05/2017   Benign essential tremor 12/13/2015   Essential hypertension 10/25/2015   Goiter 10/25/2015   Thyroid  cyst 10/25/2015   Type II diabetes mellitus (HCC) 10/25/2015   COPD exacerbation (HCC) 10/23/2015   Gastroesophageal reflux disease with esophagitis 01/18/2015   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 [place] [place] [address] Phone: [phone] Fax: [phone]     Social Drivers of Health (SDOH) Social History: SDOH Screenings   Food Insecurity:  No Food Insecurity (11/15/2023)  Housing: Low Risk  (11/15/2023)  Transportation Needs: No Transportation Needs (11/15/2023)  Utilities: Not At Risk (11/15/2023)  Social Connections: Moderately Isolated (11/15/2023)  Tobacco Use: High Risk (11/15/2023)   SDOH Interventions:     Readmission Risk Interventions    07/30/2023    4:47 PM  Readmission Risk Prevention Plan  Transportation Screening Complete  PCP or Specialist Appt within 3-5 Days Complete  HRI or Home Care Consult Complete  Social Work Consult for Recovery Care Planning/Counseling Complete  Palliative Care Screening Not Applicable  Medication Review Oceanographer) Complete

## 2023-11-16 NOTE — Evaluation (Signed)
 Physical Therapy Evaluation Patient Details Name: Stephanie Small MRN: [id number] DOB: 20-Sep-1945 Today's Date: 11/16/2023  History of Present Illness  MARLYN TONDREAU is a 78 y.o. female with medical history significant for COPD, chronic pain, hypothyroidism, DM, HTN, , A-fib on Eliquis , with history of hospitalization from 1/29 to 08/06/2023 for respiratory failure secondary to influenza A and COPD, requiring up to 12 L HFNC weaned down to 2 L by discharge, who presents To the ED with a 1 day history of shortness of breath that acutely worsened, waking her from sleep.   Clinical Impression  Pt admitted with above diagnosis. Pt currently with functional limitations due to the deficits listed below (see PT Problem List). Pt received upright in bed agreeable to PT with family member present. Pt reports PTA being mod-I with SPC with household and short community distances. Lives with family that assists with IADL's.   To date, pt is on 2 L/min with mobility. At rest and with ambulation pt does not desat below 90%. She is independent with bed mobility and STS transfers performing multiple at [facility], ambulates supervision to recliner and able to don shoes in  sitting. Pt then ambulates ~200' in room at a very quick cadence managing O2 tank and telemetry unit. Pt does evidently display DOE needing heavy VC's for slowed pace for energy conservation (slowed gait cadence, standing rest breaks) ultimately relying on x1 standing rest break. Pt's SOB does recover quickly (< 30 sec) returning to recliner with all needs in reach. Pt has questions on prognosis of supplemental O2 needs for acutely and also long term. Deferring to pulmonology for clarification with pt understanding. Did educate pt on further energy conservation techniques and maintaining mobility to tolerance for maintaining QoL and independence with functional mobility. Pt will benefit from f/u PT recs to address acute deficits in poor endurance and  activity tolerance due to SOB.      If plan is discharge home, recommend the following: Assistance with cooking/housework;Assist for transportation;Help with stairs or ramp for entrance   Can travel by private vehicle        Equipment Recommendations None recommended by PT  Recommendations for Other Services       Functional Status Assessment Patient has had a recent decline in their functional status and demonstrates the ability to make significant improvements in function in a reasonable and predictable amount of time.     Precautions / Restrictions Precautions Precautions: None Recall of Precautions/Restrictions: Intact Restrictions Weight Bearing Restrictions Per Provider Order: No      Mobility  Bed Mobility Overal bed mobility: Independent               Patient Response: Cooperative  Transfers Overall transfer level: Independent Equipment used: None                    Ambulation/Gait Ambulation/Gait assistance: Supervision Gait Distance (Feet): 200 Feet Assistive device: None Gait Pattern/deviations: Step-through pattern, WFL(Within Functional Limits)       General Gait Details: x1 standing rest break less than 30 seconds due to DOE  Stairs            Wheelchair Mobility     Tilt Bed Tilt Bed Patient Response: Cooperative  Modified Rankin (Stroke Patients Only)       Balance  Pertinent Vitals/Pain Pain Assessment Pain Assessment: No/denies pain    Home Living Family/patient expects to be discharged to:: Private residence Living Arrangements: Children (daughter and SIL)   Type of Home: House Home Access: Stairs to enter Entrance Stairs-Rails: None (uses door jam) Entrance Stairs-Number of Steps: 1 larger than 8" step and smaller step up from the porch   Home Layout: One level Home Equipment: Cane - single Librarian, academic (2 wheels) Additional  Comments: home O2 since last admission    Prior Function Prior Level of Function : Independent/Modified Independent             Mobility Comments: uses SPC at baseline ADLs Comments: family assists with IADL's like transportation and household upkeep     Extremity/Trunk Assessment   Upper Extremity Assessment Upper Extremity Assessment: Overall WFL for tasks assessed    Lower Extremity Assessment Lower Extremity Assessment: Overall WFL for tasks assessed    Cervical / Trunk Assessment Cervical / Trunk Assessment: Normal  Communication   Communication Communication: No apparent difficulties    Cognition Arousal: Alert Behavior During Therapy: WFL for tasks assessed/performed   PT - Cognitive impairments: No apparent impairments                       PT - Cognition Comments: Very pleasant and cooperative Following commands: Intact       Cueing Cueing Techniques: Verbal cues     General Comments      Exercises Other Exercises Other Exercises: Energy conservation techniques   Assessment/Plan    PT Assessment Patient needs continued PT services  PT Problem List Decreased activity tolerance       PT Treatment Interventions DME instruction;Balance training;Gait training;Neuromuscular re-education;Stair training;Functional mobility training;Patient/family education;Therapeutic activities;Therapeutic exercise    PT Goals (Current goals can be found in the Care Plan section)  Acute Rehab PT Goals Patient Stated Goal: to go home and improve breathing PT Goal Formulation: With patient Time For Goal Achievement: 11/30/23 Potential to Achieve Goals: Good    Frequency Min 1X/week     Co-evaluation               AM-PAC PT "6 Clicks" Mobility  Outcome Measure Help needed turning from your back to your side while in a flat bed without using bedrails?: None Help needed moving from lying on your back to sitting on the side of a flat bed without  using bedrails?: None Help needed moving to and from a bed to a chair (including a wheelchair)?: A Little Help needed standing up from a chair using your arms (e.g., wheelchair or bedside chair)?: A Little Help needed to walk in hospital room?: None Help needed climbing 3-5 steps with a railing? : A Little 6 Click Score: 21    End of Session Equipment Utilized During Treatment: Gait belt Activity Tolerance: Patient tolerated treatment well Patient left: in chair;with call bell/phone within reach;with family/visitor present;Other (comment) (in care of RT) Nurse Communication: Mobility status PT Visit Diagnosis: Muscle weakness (generalized) (M62.81)    Time: 1610-9604 PT Time Calculation (min) (ACUTE ONLY): 17 min   Charges:   PT Evaluation $PT Eval Low Complexity: 1 Low   PT General Charges $$ ACUTE PT VISIT: 1 Visit         Meyli Boice M. Fairly IV, PT, DPT Physical Therapist- [place]  [facility]  11/16/2023, 2:24 PM

## 2023-11-16 NOTE — Progress Notes (Signed)
 PROGRESS NOTE    Stephanie Small  BJY:[id number] DOB: Feb 17, 1946 DOA: 11/15/2023 PCP: Trenda Frisk, FNP   Assessment & Plan:   Principal Problem:   COPD exacerbation [facility]) Active Problems:   Acute on chronic respiratory failure with hypoxia (HCC)   CAP (community acquired pneumonia)   AKI (acute kidney injury) (HCC)   Essential hypertension   Type II diabetes mellitus (HCC)   Chronic pain syndrome   Hypothyroidism   Anxiety   Paroxysmal atrial fibrillation (HCC)   Chronic anticoagulation   Pulmonary nodule  Assessment and Plan: COPD exacerbation: continue on azithromycin , steroids, bronchodilators & encourage incentive spirometry   CAP: continue on IV rocephin , azithromycin , steroids, bronchodilators & encourage incentive spirometry   Acute on chronic hypoxic respiratory failure: secondary to above. Continue on supplemental oxygen and back at baseline currently  Pulmonary nodule: will need to f/u outpatient w/ repeat CT in 6-12 months    PAF: continue on home dose of amio, eliquis . Holding diltiazem     Vaginal yeast infection: completed fluconazole  course    Depression: severity unknown. Continue on home dose of citalopram     Hypothyroidism: continue on home dose of levothyroxine     Chronic pain syndrome: continue on home dose of fentanyl , oxy, gabapentin  & flexeril     DM2: good controlled, HbA1c 6.7. Continue on SSI w/ accuchecks    HTN: holding lasix , losartan . No longer takes diltiazem  as per med rec   Obesity: BMI 38.9. Complicates overall care & prognosis     DVT prophylaxis:  eliquis  Code Status: full  Family Communication: discussed pt's care w/ pt's family at bedside and answered their questions Disposition Plan: likely d/c back home   Level of care: Telemetry Medical  Status is: Observation The patient remains OBS appropriate and will d/c before 2 midnights.    Consultants:    Procedures:  Antimicrobials: azithromycin ,  rocephin    Subjective: Pt c/o chills.   Objective: Vitals:   11/15/23 2314 11/16/23 0408 11/16/23 0734 11/16/23 0734  BP: (!) 126/52 (!) 100/43  (!) 113/56  Pulse: 70 64  70  Resp: 19 18  16   Temp: 98.2 F (36.8 C) 98.4 F (36.9 C)  97.6 F (36.4 C)  TempSrc:  Oral  Oral  SpO2: 93% 93% 94% 94%  Weight:      Height:        Intake/Output Summary (Last 24 hours) at 11/16/2023 0825 Last data filed at 11/15/2023 1500 Gross per 24 hour  Intake 600 ml  Output --  Net 600 ml   Filed Weights   11/15/23 0144 11/15/23 0534  Weight: 99.8 kg 103 kg    Examination:  General exam: Appears uncomfortable  Respiratory system: decreased breath sounds b/l  Cardiovascular system: S1/S2+. No rubs or clicks  Gastrointestinal system: abd is soft, NT, obese & hypoactive bowel sounds Central nervous system: alert & oriented. Moves all extremities  Psychiatry: judgement and insight appears at baseline. Flat mood and affect     Data Reviewed: I have personally reviewed following labs and imaging studies  CBC: Recent Labs  Lab 11/15/23 0213  WBC 6.4  HGB 12.4  HCT 38.0  MCV 94.1  PLT 225   Basic Metabolic Panel: Recent Labs  Lab 11/15/23 0213 11/16/23 0527  NA 137 138  K 4.3 4.2  CL 102 106  CO2 24 26  GLUCOSE 137* 197*  BUN 25* 28*  CREATININE 1.12* 0.94  CALCIUM  8.8* 9.2   GFR: Estimated Creatinine Clearance: 60.4 mL/min (by C-G  formula based on SCr of 0.94 mg/dL). Liver Function Tests: No results for input(s): "AST", "ALT", "ALKPHOS", "BILITOT", "PROT", "ALBUMIN" in the last 168 hours. No results for input(s): "LIPASE", "AMYLASE" in the last 168 hours. No results for input(s): "AMMONIA" in the last 168 hours. Coagulation Profile: No results for input(s): "INR", "PROTIME" in the last 168 hours. Cardiac Enzymes: No results for input(s): "CKTOTAL", "CKMB", "CKMBINDEX", "TROPONINI" in the last 168 hours. BNP (last 3 results) No results for input(s): "PROBNP" in the  last 8760 hours. HbA1C: Recent Labs    11/15/23 0743  HGBA1C 6.7*   CBG: Recent Labs  Lab 11/15/23 0619 11/15/23 0839 11/15/23 1146 11/15/23 1621 11/15/23 2129  GLUCAP 173* 222* 200* 251* 215*   Lipid Profile: No results for input(s): "CHOL", "HDL", "LDLCALC", "TRIG", "CHOLHDL", "LDLDIRECT" in the last 72 hours. Thyroid  Function Tests: No results for input(s): "TSH", "T4TOTAL", "FREET4", "T3FREE", "THYROIDAB" in the last 72 hours. Anemia Panel: No results for input(s): "VITAMINB12", "FOLATE", "FERRITIN", "TIBC", "IRON", "RETICCTPCT" in the last 72 hours. Sepsis Labs: No results for input(s): "PROCALCITON", "LATICACIDVEN" in the last 168 hours.  No results found for this or any previous visit (from the past 240 hours).       Radiology Studies: CT Angio Chest PE W and/or Wo Contrast Result Date: 11/15/2023 CLINICAL DATA:  High probability pulmonary embolism EXAM: CT ANGIOGRAPHY CHEST WITH CONTRAST TECHNIQUE: Multidetector CT imaging of the chest was performed using the standard protocol during bolus administration of intravenous contrast. Multiplanar CT image reconstructions and MIPs were obtained to evaluate the vascular anatomy. RADIATION DOSE REDUCTION: This exam was performed according to the departmental dose-optimization program which includes automated exposure control, adjustment of the mA and/or kV according to patient size and/or use of iterative reconstruction technique. CONTRAST:  OMNIPAQUE  IOHEXOL  350 MG/ML SOLN COMPARISON:  07/26/2021 FINDINGS: Cardiovascular: Adequate opacification of the pulmonary arterial tree. No intraluminal filling defect identified to suggest acute pulmonary embolism. Central pulmonary arteries are dilated in keeping with changes of pulmonary arterial hypertension, unchanged. Moderate multi-vessel coronary artery calcification. Cardiac size mildly enlarged, unchanged. No pericardial effusion. Mild thyroid  goiter atherosclerotic calcification  within the thoracic aorta. No aortic aneurysm. Mediastinum/Nodes: Thyroid  goiter. No pathologic thoracic adenopathy. Esophagus unremarkable. Moderate hiatal hernia. Lungs/Pleura: 7 mm noncalcified pulmonary nodule is seen within the right lower lobe at axial image # 77/5, stable in size since prior examination with demonstrating increasing density. Scattered parenchymal scarring again seen. Superimposed diffuse slightly asymmetric and upper lobe predominant ground-glass pulmonary infiltrate has developed, possibly infectious or inflammatory in the acute setting. No pneumothorax or pleural effusion. Central airways are widely patent. Upper Abdomen: No acute abnormality. Musculoskeletal: No acute bone abnormality. No lytic or blastic bone lesion. Osseous structures are age appropriate. Review of the MIP images confirms the above findings. IMPRESSION: 1. No acute pulmonary embolism. 2. Interval development of diffuse slightly asymmetric and upper lobe predominant ground-glass pulmonary infiltrate, possibly infectious or inflammatory in the acute setting. 3. 7 mm noncalcified pulmonary nodule within the right lower lobe, stable in size since prior examination with demonstrating increasing density. Continued follow-up is warranted with Non-contrast chest CT at 6-12 months. If the nodule is stable at time of repeat CT, then future CT at 18-24 months (from today's scan) is considered optional for low-risk patients, but is recommended for high-risk patients. This recommendation follows the consensus statement: Guidelines for Management of Incidental Pulmonary Nodules Detected on CT Images: From the Fleischner Society 2017; Radiology 2017; 284:228-243. 4. Moderate multi-vessel coronary artery  calcification. Stable mild cardiomegaly. 5. Morphologic changes in keeping with pulmonary arterial hypertension. 6. Moderate hiatal hernia. Aortic Atherosclerosis (ICD10-I70.0). Electronically Signed   By: Worthy Heads M.D.   On:  11/15/2023 04:02   DG Chest Portable 1 View Result Date: 11/15/2023 CLINICAL DATA:  Shortness of breath. Admission earlier this year for flu, discharged home on supplemental oxygen. EXAM: PORTABLE CHEST 1 VIEW COMPARISON:  Portable chest 08/01/2023 FINDINGS: There is mild cardiomegaly. No vascular congestion is seen. Mediastinum is stable with aortic atherosclerosis and tortuosity. There is asymmetric coarse interstitial change in the left lower lung field, which could be due to interstitial pneumonitis or scarring change. There is bilateral perihilar linear scarring or atelectasis. No pleural effusion. The remaining lungs are clear. There is thoracic spondylosis and mild dextroscoliosis. IMPRESSION: 1. Asymmetric coarse interstitial change in the left lower lung field, which could be due to interstitial pneumonitis or scarring change. This is similar to the last chest x-ray but developed since the portable chest from 07/29/2023. 2. Bilateral perihilar linear scarring or atelectasis. 3. Aortic atherosclerosis. 4. Mild cardiomegaly. Electronically Signed   By: Denman Fischer M.D.   On: 11/15/2023 02:47        Scheduled Meds:  amiodarone   400 mg Oral Daily   apixaban   5 mg Oral BID   citalopram   20 mg Oral Daily   dapagliflozin  propanediol  10 mg Oral Daily   [START ON 11/17/2023] fentaNYL   1 patch Transdermal Q72H   fluconazole   150 mg Oral Daily   guaiFENesin   600 mg Oral BID   insulin  aspart  0-20 Units Subcutaneous TID WC   insulin  aspart  0-5 Units Subcutaneous QHS   ipratropium-albuterol   3 mL Nebulization TID   levothyroxine   50 mcg Oral Q0600   pantoprazole   40 mg Oral Daily   pneumococcal 20-valent conjugate vaccine  0.5 mL Intramuscular Tomorrow-1000   predniSONE   40 mg Oral Q breakfast   rifaximin   550 mg Oral BID   rosuvastatin   20 mg Oral QPM   Continuous Infusions:  azithromycin  500 mg (11/16/23 0538)   cefTRIAXone  (ROCEPHIN )  IV       LOS: 0 days     Alphonsus Jeans, MD [facility] Pager [phone]  If 7PM-7AM, please contact night-coverage [email or web address] 11/16/2023, 8:25 AM

## 2023-11-16 NOTE — Evaluation (Signed)
 Occupational Therapy Evaluation Patient Details Name: Stephanie Small MRN: [id number] DOB: 04-12-1946 Today's Date: 11/16/2023   History of Present Illness   Stephanie Small is a 78 y.o. female with medical history significant for COPD, chronic pain, hypothyroidism, DM, HTN, , A-fib on Eliquis , with history of hospitalization from 1/29 to 08/06/2023 for respiratory failure secondary to influenza A and COPD, requiring up to 12 L HFNC weaned down to 2 L by discharge, who presents To the ED with a 1 day history of shortness of breath that acutely worsened, waking her from sleep.     Clinical Impressions Upon entering the room, pt supine in bed with son present in room. Pt endorses being Ind at baseline with self care tasks and living with family. Family assist with transportation and IADL tasks. Pt recently started using O2 at home. Pt performs bed mobility independently and dons shoes while seated on EOB without assistance. Pt stands and manages O2 line independently within room and while ambulation to bathroom for toileting needs. Pt stands at sink to wash face and for hand hygiene without assistance. Pt returning back to room in same manner as above. No LOB. Pt bends down to pick up O2 line from floor with no issues or concerns. Pt returning to bed. Call bell and all needed items within reach. Pt does not need skilled acute OT intervention at this time.       Functional Status Assessment   Patient has not had a recent decline in their functional status     Equipment Recommendations   None recommended by OT      Precautions/Restrictions   Precautions Precautions: None Recall of Precautions/Restrictions: Intact     Mobility Bed Mobility Overal bed mobility: Independent                  Transfers Overall transfer level: Independent Equipment used: None                      Balance Overall balance assessment: Modified Independent                                          ADL either performed or assessed with clinical judgement   ADL Overall ADL's : Modified independent                                             Vision Patient Visual Report: No change from baseline              Pertinent Vitals/Pain Pain Assessment Pain Assessment: No/denies pain     Extremity/Trunk Assessment Upper Extremity Assessment Upper Extremity Assessment: Overall WFL for tasks assessed   Lower Extremity Assessment Lower Extremity Assessment: Overall WFL for tasks assessed   Cervical / Trunk Assessment Cervical / Trunk Assessment: Normal   Communication Communication Communication: No apparent difficulties Factors Affecting Communication: Reduced clarity of speech   Cognition Arousal: Alert   Cognition: No apparent impairments                               Following commands: Intact       Cueing  General Comments   Cueing Techniques: Verbal cues  Exercises     Shoulder Instructions      Home Living Family/patient expects to be discharged to:: Private residence Living Arrangements: Children (daughter and son in law) Available Help at Discharge: Available 24 hours/day Type of Home: House Home Access: Stairs to enter [employer] of Steps: 1 larger than 8" step and smaller step up from the porch Entrance Stairs-Rails: None Home Layout: One level     Bathroom Shower/Tub: Tub/shower unit         Home Equipment: Cane - single Librarian, academic (2 wheels)   Additional Comments: home O2 since last admission      Prior Functioning/Environment Prior Level of Function : Independent/Modified Independent             Mobility Comments: uses SPC at baseline ADLs Comments: Pt reports getting down into tub for bathing and family assists with IADLs            OT Goals(Current goals can be found in the care plan section)   Acute Rehab OT Goals Patient  Stated Goal: to go home OT Goal Formulation: With patient Time For Goal Achievement: 11/16/23 Potential to Achieve Goals: Good   AM-PAC OT "6 Clicks" Daily Activity     Outcome Measure Help from another person eating meals?: None Help from another person taking care of personal grooming?: None Help from another person toileting, which includes using toliet, bedpan, or urinal?: None Help from another person bathing (including washing, rinsing, drying)?: None Help from another person to put on and taking off regular upper body clothing?: None Help from another person to put on and taking off regular lower body clothing?: None 6 Click Score: 24   End of Session Equipment Utilized During Treatment: Oxygen Nurse Communication: Mobility status  Activity Tolerance: Patient tolerated treatment well Patient left: in bed;with call bell/phone within reach;with bed alarm set                   Time: 6045-4098 OT Time Calculation (min): 13 min Charges:  OT General Charges $OT Visit: 1 Visit OT Evaluation $OT Eval Low Complexity: 1 Low  George Kinder, MS, OTR/L , CBIS ascom [phone]  11/16/23, 3:17 PM

## 2023-11-17 ENCOUNTER — Ambulatory Visit: Admitting: Family

## 2023-11-17 DIAGNOSIS — J441 Chronic obstructive pulmonary disease with (acute) exacerbation: Secondary | ICD-10-CM | POA: Diagnosis not present

## 2023-11-17 LAB — BASIC METABOLIC PANEL WITH GFR
Anion gap: 8 (ref 5–15)
BUN: 32 mg/dL — ABNORMAL HIGH (ref 8–23)
CO2: 24 mmol/L (ref 22–32)
Calcium: 9.2 mg/dL (ref 8.9–10.3)
Chloride: 105 mmol/L (ref 98–111)
Creatinine, Ser: 0.97 mg/dL (ref 0.44–1.00)
GFR, Estimated: >60 mL/min (ref >60)
Glucose, Bld: 140 mg/dL — ABNORMAL HIGH (ref 70–99)
Potassium: 3.9 mmol/L (ref 3.5–5.1)
Sodium: 137 mmol/L (ref 135–145)

## 2023-11-17 LAB — GLUCOSE, CAPILLARY
Glucose-Capillary: 141 mg/dL — ABNORMAL HIGH (ref 70–99)
Glucose-Capillary: 136 mg/dL — ABNORMAL HIGH (ref 70–99)

## 2023-11-17 MED ORDER — PREDNISONE 20 MG PO TABS: 40.0000 mg | ORAL_TABLET | Freq: Every day | ORAL | 0 refills | Status: AC

## 2023-11-17 MED ORDER — AZITHROMYCIN 250 MG PO TABS: 250.0000 mg | ORAL_TABLET | Freq: Every day | ORAL | 0 refills | Status: AC

## 2023-11-17 MED ORDER — AZITHROMYCIN 250 MG PO TABS: 500.0000 mg | ORAL_TABLET | Freq: Every day | ORAL | Status: DC

## 2023-11-17 NOTE — Progress Notes (Signed)
 PHARMACIST - PHYSICIAN COMMUNICATION DR:   Broadus Canes CONCERNING: Antibiotic IV to Oral Route Change Policy  RECOMMENDATION: This patient is receiving azithromycin  by the intravenous route.  Based on criteria approved by the Pharmacy and Therapeutics Committee, the antibiotic(s) is/are being converted to the equivalent oral dose form(s).   DESCRIPTION: These criteria include: Patient being treated for a respiratory tract infection, urinary tract infection, cellulitis or clostridium difficile associated diarrhea if on metronidazole The patient is not neutropenic and does not exhibit a GI malabsorption state The patient is eating (either orally or via tube) and/or has been taking other orally administered medications for a least 24 hours The patient is improving clinically and has a Tmax < 100.5  If you have questions about this conversion, please contact the Pharmacy Department    Ramonita Burow, PharmD, BCPS Clinical Pharmacist

## 2023-11-17 NOTE — Discharge Summary (Addendum)
 Physician Discharge Summary  EYVA CALIFANO QMV:[id number] DOB: 1946-02-25 DOA: 11/15/2023  PCP: Trenda Frisk, FNP  Admit date: 11/15/2023 Discharge date: 11/17/2023  Admitted From: home  Disposition:  home w/ home health   Recommendations for Outpatient Follow-up:  Follow up with PCP in 1-2 weeks Will need repeat CXR in 4-6 weeks to confirm resolution of pneumonia   Home Health: yes Equipment/Devices: 2L [place]  Discharge Condition: stable  CODE STATUS: full  Diet recommendation:  Carb Modified  Brief/Interim Summary: HPI was taken from Dr. Vallarie Gauze: Stephanie Small is a 78 y.o. female with medical history significant for COPD, chronic pain, hypothyroidism, DM, HTN, , A-fib on Eliquis , with history of hospitalization from 1/29 to 08/06/2023 for respiratory failure secondary to influenza A and COPD, requiring up to 12 L HFNC weaned down to 2 L by discharge, who presents To the ED with a 1 day history of shortness of breath that acutely worsened, waking her from sleep.  She denied chest pain, lower extremity pain or swelling and has had no cough fever or chills.  She was previously at her baseline. ED course and data review: Afebrile, pulse in the 50s, tachypneic to 25 requiring 3 L to maintain sats in the mid to high 90s.  BP 105/58 Labs notable for normal troponin of 5 and BNP 44 CBC completely normal BMP notable for creatinine 1.12 above baseline of 0.75   EKG, personally viewed and interpreted showing sinus bradycardia at 53   CTA chest negative for PE groundglass pulmonary infiltrate possibly infectious or inflammatory as well as a pulmonary nodule multivessel artery calcification    patient treated with DuoNebs and Solu-Medrol  started on Rocephin  and azithromycin  but continued to have increased work of breathing.  She also got Ativan  for anxiety   hospitalist consulted for admission     Discharge Diagnoses:  Principal Problem:   COPD exacerbation (HCC) Active Problems:    Acute on chronic respiratory failure with hypoxia (HCC)   CAP (community acquired pneumonia)   AKI (acute kidney injury) (HCC)   Essential hypertension   Type II diabetes mellitus (HCC)   Chronic pain syndrome   Hypothyroidism   Anxiety   Paroxysmal atrial fibrillation (HCC)   Chronic anticoagulation   Pulmonary nodule  COPD exacerbation: continue on azithromycin , steroids, bronchodilators & encourage incentive spirometry. D/c w/ po azithromycin , steroids to complete the course.   CAP: continue on IV rocephin , azithromycin , steroids, bronchodilators & encourage incentive spirometry   Acute on chronic hypoxic respiratory failure: secondary to above. Continue on supplemental oxygen and back at baseline currently  Pulmonary nodule: will need to f/u outpatient w/ repeat CT in 6-12 months    PAF: continue on home dose of amio, eliquis .  Vaginal yeast infection: completed fluconazole  course    Depression: severity unknown. Continue on home dose of citalopram     Hypothyroidism: continue on home dose of levothyroxine     Chronic pain syndrome: continue on home dose of fentanyl , oxy, gabapentin  & flexeril     DM2: good controlled, HbA1c 6.7. Continue on SSI w/ accuchecks    HTN: restart lasix , losartan .   Obesity: BMI 38.9. Complicates overall care & prognosis   Discharge Instructions  Discharge Instructions     Diet Carb Modified   Complete by: As directed    Discharge instructions   Complete by: As directed    F/u w/ PCP in 1-2 weeks. Need a repeat chest XR in 4-6 weeks to confirm resolution of the pneumonia. Your PCP can  order this for you.   Increase activity slowly   Complete by: As directed       Allergies as of 11/17/2023       Reactions   Shellfish-derived Products Anaphylaxis, Rash   Allergic to oysters, not sure if she's allergic to shellfish.  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   Shellfish Allergy Rash        Medication List      STOP taking these medications    diclofenac  Sodium 1 % Gel Commonly known as: VOLTAREN    diltiazem  360 MG 24 hr capsule Commonly known as: CARDIZEM  CD   Ipratropium-Albuterol  20-100 MCG/ACT Aers respimat Commonly known as: COMBIVENT    lidocaine  5 % Commonly known as: LIDODERM    nystatin  100000 UNIT/ML suspension Commonly known as: MYCOSTATIN    ofloxacin  0.3 % OTIC solution Commonly known as: FLOXIN    Voquezna  20 MG Tabs Generic drug: Vonoprazan Fumarate        TAKE these medications    albuterol  108 (90 Base) MCG/ACT inhaler Commonly known as: VENTOLIN  HFA Inhale 2 puffs into the lungs every 6 (six) hours as needed for wheezing or shortness of breath. What changed: Another medication with the same name was removed. Continue taking this medication, and follow the directions you see here.   amiodarone  400 MG tablet Commonly known as: PACERONE  Take 1 tablet (400 mg total) by mouth daily.   ascorbic acid  500 MG tablet Commonly known as: VITAMIN C Take 500 mg by mouth daily.   azithromycin  250 MG tablet Commonly known as: ZITHROMAX  Take 1 tablet (250 mg total) by mouth daily for 3 days. Start taking on: Nov 18, 2023   Breo Ellipta  100-25 MCG/ACT Aepb Generic drug: fluticasone  furoate-vilanterol Inhale 1 puff into the lungs daily. What changed: Another medication with the same name was removed. Continue taking this medication, and follow the directions you see here.   citalopram  20 MG tablet Commonly known as: CELEXA  TAKE 1 TABLET BY MOUTH ONCE DAILY   cyclobenzaprine  5 MG tablet Commonly known as: FLEXERIL  Take 5 mg by mouth 2 (two) times daily as needed for muscle spasms.   dapagliflozin  propanediol 10 MG Tabs tablet Commonly known as: FARXIGA  Take 1 tablet (10 mg total) by mouth daily.   Eliquis  5 MG Tabs tablet Generic drug: apixaban  TAKE 1 TABLET BY MOUTH TWICE DAILY   fentaNYL  75 MCG/HR Commonly known as: DURAGESIC  Place 1 patch onto the skin  every 3 (three) days.   fluticasone  50 MCG/ACT nasal spray Commonly known as: FLONASE  Place 2 sprays into both nostrils daily.   furosemide  20 MG tablet Commonly known as: LASIX  TAKE 1 TABLET BY MOUTH ONCE DAILY   gabapentin  400 MG capsule Commonly known as: NEURONTIN  Take 400 mg by mouth 2 (two) times daily.   isosorbide  mononitrate 30 MG 24 hr tablet Commonly known as: IMDUR  TAKE 1 TABLET BY MOUTH ONCE DAILY   Januvia 100 MG tablet Generic drug: sitaGLIPtin TAKE 1 TABLET BY MOUTH ONCE DAILY   levothyroxine  50 MCG tablet Commonly known as: SYNTHROID  Take 1 tablet (50 mcg total) by mouth daily at 6 (six) AM.   losartan  50 MG tablet Commonly known as: COZAAR  TAKE 1 TABLET BY MOUTH ONCE DAILY   multivitamin with minerals Tabs tablet Take 1 tablet by mouth daily.   naloxone 4 MG/0.1ML Liqd nasal spray kit Commonly known as: NARCAN Place 1 spray into the nose once.   omeprazole 40 MG capsule Commonly known as: PRILOSEC TAKE 1  CAPSULE BY MOUTH ONCE DAILY   ondansetron  4 MG tablet Commonly known as: ZOFRAN  Take 4 mg by mouth every 8 (eight) hours as needed.   OneTouch Verio test strip Generic drug: glucose blood Use as instructed   Oxycodone  HCl 10 MG Tabs Take 10 mg by mouth daily.   predniSONE  20 MG tablet Commonly known as: DELTASONE  Take 2 tablets (40 mg total) by mouth daily with breakfast for 4 days. Start taking on: Nov 18, 2023   rosuvastatin  20 MG tablet Commonly known as: CRESTOR  TAKE 1 TABLET BY MOUTH ONCE EVERY EVENING   SUMAtriptan  100 MG tablet Commonly known as: IMITREX  TAKE 1 TABLET BY MOUTH AS NEEDED FOR HEADACHE. MAX 2 TABLETS PER DAY   tolterodine 2 MG 24 hr capsule Commonly known as: DETROL LA TAKE 1 CAPSULE BY MOUTH ONCE DAILY   Xifaxan  550 MG Tabs tablet Generic drug: rifaximin  TAKE 1 TABLET BY MOUTH TWICE DAILY        Allergies  Allergen Reactions   Shellfish-Derived Products Anaphylaxis and Rash    Allergic to oysters,  not sure if she's allergic to shellfish.  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   Shellfish Allergy Rash    Consultations:    Procedures/Studies: CT Angio Chest PE W and/or Wo Contrast Result Date: 11/15/2023 CLINICAL DATA:  High probability pulmonary embolism EXAM: CT ANGIOGRAPHY CHEST WITH CONTRAST TECHNIQUE: Multidetector CT imaging of the chest was performed using the standard protocol during bolus administration of intravenous contrast. Multiplanar CT image reconstructions and MIPs were obtained to evaluate the vascular anatomy. RADIATION DOSE REDUCTION: This exam was performed according to the departmental dose-optimization program which includes automated exposure control, adjustment of the mA and/or kV according to patient size and/or use of iterative reconstruction technique. CONTRAST:  OMNIPAQUE  IOHEXOL  350 MG/ML SOLN COMPARISON:  07/26/2021 FINDINGS: Cardiovascular: Adequate opacification of the pulmonary arterial tree. No intraluminal filling defect identified to suggest acute pulmonary embolism. Central pulmonary arteries are dilated in keeping with changes of pulmonary arterial hypertension, unchanged. Moderate multi-vessel coronary artery calcification. Cardiac size mildly enlarged, unchanged. No pericardial effusion. Mild thyroid  goiter atherosclerotic calcification within the thoracic aorta. No aortic aneurysm. Mediastinum/Nodes: Thyroid  goiter. No pathologic thoracic adenopathy. Esophagus unremarkable. Moderate hiatal hernia. Lungs/Pleura: 7 mm noncalcified pulmonary nodule is seen within the right lower lobe at axial image # 77/5, stable in size since prior examination with demonstrating increasing density. Scattered parenchymal scarring again seen. Superimposed diffuse slightly asymmetric and upper lobe predominant ground-glass pulmonary infiltrate has developed, possibly infectious or inflammatory in the acute setting. No pneumothorax or pleural effusion.  Central airways are widely patent. Upper Abdomen: No acute abnormality. Musculoskeletal: No acute bone abnormality. No lytic or blastic bone lesion. Osseous structures are age appropriate. Review of the MIP images confirms the above findings. IMPRESSION: 1. No acute pulmonary embolism. 2. Interval development of diffuse slightly asymmetric and upper lobe predominant ground-glass pulmonary infiltrate, possibly infectious or inflammatory in the acute setting. 3. 7 mm noncalcified pulmonary nodule within the right lower lobe, stable in size since prior examination with demonstrating increasing density. Continued follow-up is warranted with Non-contrast chest CT at 6-12 months. If the nodule is stable at time of repeat CT, then future CT at 18-24 months (from today's scan) is considered optional for low-risk patients, but is recommended for high-risk patients. This recommendation follows the consensus statement: Guidelines for Management of Incidental Pulmonary Nodules Detected on CT Images: From the Fleischner Society 2017; Radiology 2017; 284:228-243. 4.  Moderate multi-vessel coronary artery calcification. Stable mild cardiomegaly. 5. Morphologic changes in keeping with pulmonary arterial hypertension. 6. Moderate hiatal hernia. Aortic Atherosclerosis (ICD10-I70.0). Electronically Signed   By: Worthy Heads M.D.   On: 11/15/2023 04:02   DG Chest Portable 1 View Result Date: 11/15/2023 CLINICAL DATA:  Shortness of breath. Admission earlier this year for flu, discharged home on supplemental oxygen. EXAM: PORTABLE CHEST 1 VIEW COMPARISON:  Portable chest 08/01/2023 FINDINGS: There is mild cardiomegaly. No vascular congestion is seen. Mediastinum is stable with aortic atherosclerosis and tortuosity. There is asymmetric coarse interstitial change in the left lower lung field, which could be due to interstitial pneumonitis or scarring change. There is bilateral perihilar linear scarring or atelectasis. No pleural  effusion. The remaining lungs are clear. There is thoracic spondylosis and mild dextroscoliosis. IMPRESSION: 1. Asymmetric coarse interstitial change in the left lower lung field, which could be due to interstitial pneumonitis or scarring change. This is similar to the last chest x-ray but developed since the portable chest from 07/29/2023. 2. Bilateral perihilar linear scarring or atelectasis. 3. Aortic atherosclerosis. 4. Mild cardiomegaly. Electronically Signed   By: Denman Fischer M.D.   On: 11/15/2023 02:47   (Echo, Carotid, EGD, Colonoscopy, ERCP)    Subjective: Pt c/o fatigue    Discharge Exam: Vitals:   11/17/23 0427 11/17/23 0823  BP: 135/64 103/66  Pulse: 70 81  Resp: 18 18  Temp: 98.3 F (36.8 C) 98.4 F (36.9 C)  SpO2: 96% 96%   Vitals:   11/16/23 0847 11/16/23 2057 11/17/23 0427 11/17/23 0823  BP: (!) 103/40 (!) 111/50 135/64 103/66  Pulse:  76 70 81  Resp:  18 18 18   Temp: 98.2 F (36.8 C) 98.7 F (37.1 C) 98.3 F (36.8 C) 98.4 F (36.9 C)  TempSrc: Oral Oral Oral   SpO2: 92% 94% 96% 96%  Weight:      Height:        General: Pt is alert, awake, not in acute distress Cardiovascular: S1/S2 +, no rubs, no gallops Respiratory: decreased breath sounds b/l Abdominal: Soft, NT,obese, bowel sounds + Extremities:no cyanosis    The results of significant diagnostics from this hospitalization (including imaging, microbiology, ancillary and laboratory) are listed below for reference.     Microbiology: No results found for this or any previous visit (from the past 240 hours).   Labs: BNP (last 3 results) Recent Labs    07/29/23 1341 11/15/23 0214  BNP 425.1* 44.0   Basic Metabolic Panel: Recent Labs  Lab 11/15/23 0213 11/16/23 0527 11/17/23 0413  NA 137 138 137  K 4.3 4.2 3.9  CL 102 106 105  CO2 24 26 24   GLUCOSE 137* 197* 140*  BUN 25* 28* 32*  CREATININE 1.12* 0.94 0.97  CALCIUM  8.8* 9.2 9.2   Liver Function Tests: No results for input(s):  "AST", "ALT", "ALKPHOS", "BILITOT", "PROT", "ALBUMIN" in the last 168 hours. No results for input(s): "LIPASE", "AMYLASE" in the last 168 hours. No results for input(s): "AMMONIA" in the last 168 hours. CBC: Recent Labs  Lab 11/15/23 0213  WBC 6.4  HGB 12.4  HCT 38.0  MCV 94.1  PLT 225   Cardiac Enzymes: No results for input(s): "CKTOTAL", "CKMB", "CKMBINDEX", "TROPONINI" in the last 168 hours. BNP: Invalid input(s): "POCBNP" CBG: Recent Labs  Lab 11/16/23 1154 11/16/23 1741 11/16/23 2119 11/16/23 2135 11/17/23 0823  GLUCAP 223* 198* 240* 211* 141*   D-Dimer No results for input(s): "DDIMER" in the last 72 hours. Hgb  A1c Recent Labs    11/15/23 0743  HGBA1C 6.7*   Lipid Profile No results for input(s): "CHOL", "HDL", "LDLCALC", "TRIG", "CHOLHDL", "LDLDIRECT" in the last 72 hours. Thyroid  function studies No results for input(s): "TSH", "T4TOTAL", "T3FREE", "THYROIDAB" in the last 72 hours.  Invalid input(s): "FREET3" Anemia work up No results for input(s): "VITAMINB12", "FOLATE", "FERRITIN", "TIBC", "IRON", "RETICCTPCT" in the last 72 hours. Urinalysis    Component Value Date/Time   COLORURINE AMBER (A) 01/08/2021 2351   APPEARANCEUR CLOUDY (A) 01/08/2021 2351   LABSPEC 1.006 01/08/2021 2351   PHURINE 5.0 01/08/2021 2351   GLUCOSEU NEGATIVE 01/08/2021 2351   HGBUR NEGATIVE 01/08/2021 2351   BILIRUBINUR NEGATIVE 01/08/2021 2351   KETONESUR NEGATIVE 01/08/2021 2351   PROTEINUR NEGATIVE 01/08/2021 2351   NITRITE NEGATIVE 01/08/2021 2351   LEUKOCYTESUR MODERATE (A) 01/08/2021 2351   Sepsis Labs Recent Labs  Lab 11/15/23 0213  WBC 6.4   Microbiology No results found for this or any previous visit (from the past 240 hours).   Time coordinating discharge: Over 30 minutes  SIGNED:   Alphonsus Jeans, MD  [facility] 11/17/2023, 11:41 AM Pager   If 7PM-7AM, please contact night-coverage [email or web address]

## 2023-11-17 NOTE — Plan of Care (Signed)

## 2023-11-17 NOTE — TOC Transition Note (Signed)
 Transition of Care [facility]) - Discharge Note   Patient Details  Name: Stephanie Small MRN: [id number] Date of Birth: 1945-07-04  [facility] CM/SW Contact:  Loman Risk, RN Phone Number: 11/17/2023, 11:52 AM   Clinical Narrative:     Patient to discharge today [name]  with [facility] notified of discharge Message sent to MD requesting [facility] PT orders        Patient Goals and CMS Choice            Discharge Placement                       Discharge Plan and Services Additional resources added to the After Visit Summary for                                       Social Drivers of Health (SDOH) Interventions SDOH Screenings   Food Insecurity: No Food Insecurity (11/15/2023)  Housing: Low Risk  (11/15/2023)  Transportation Needs: No Transportation Needs (11/15/2023)  Utilities: Not At Risk (11/15/2023)  Social Connections: Moderately Isolated (11/15/2023)  Tobacco Use: High Risk (11/15/2023)     Readmission Risk Interventions    07/30/2023    4:47 PM  Readmission Risk Prevention Plan  Transportation Screening Complete  PCP or Specialist Appt within 3-5 Days Complete  HRI or Home Care Consult Complete  Social Work Consult for Recovery Care Planning/Counseling Complete  Palliative Care Screening Not Applicable  Medication Review [employer]) Complete

## 2023-11-19 NOTE — Telephone Encounter (Signed)
 Patient was seen in office this week for this issue.

## 2023-11-20 ENCOUNTER — Telehealth: Payer: Self-pay | Admitting: Family

## 2023-11-20 NOTE — Telephone Encounter (Signed)
 Lex Redbird, PT with [facility], left VM requesting verbal orders for PT 1w1, 2w1, 1w4.   Callback # [phone]

## 2023-11-24 ENCOUNTER — Encounter: Payer: Self-pay | Admitting: Internal Medicine

## 2023-11-24 ENCOUNTER — Ambulatory Visit: Admitting: Internal Medicine

## 2023-11-24 VITALS — BP 115/80 | HR 87 | Temp 98.3°F | Resp 16 | Ht 64.0 in | Wt 226.0 lb

## 2023-11-24 DIAGNOSIS — R0602 Shortness of breath: Secondary | ICD-10-CM | POA: Diagnosis not present

## 2023-11-24 DIAGNOSIS — J4489 Other specified chronic obstructive pulmonary disease: Secondary | ICD-10-CM | POA: Diagnosis not present

## 2023-11-24 DIAGNOSIS — Z9981 Dependence on supplemental oxygen: Secondary | ICD-10-CM | POA: Diagnosis not present

## 2023-11-24 DIAGNOSIS — F17200 Nicotine dependence, unspecified, uncomplicated: Secondary | ICD-10-CM

## 2023-11-24 DIAGNOSIS — J189 Pneumonia, unspecified organism: Secondary | ICD-10-CM

## 2023-11-24 MED ORDER — ROFLUMILAST 250 MCG PO TABS: 250.0000 ug | ORAL_TABLET | Freq: Every day | ORAL | 0 refills | Status: DC

## 2023-11-24 NOTE — Patient Instructions (Signed)

## 2023-11-24 NOTE — Progress Notes (Signed)
 [facility] [address] [place], [place] [address]  Pulmonary Sleep Medicine   Office Visit Note  Patient Name: Stephanie Small DOB: April 16, 1946 MRN [id number]  Date of Service: 11/24/2023  Complaints/HPI: She has a history of COPD and has had multiple admissions to the hospital. She was discharged from the hospital in January with oxygen. She is currently on 2lpm but promptly desaturates to 88% after removing the oxygen at rest. Patient was smoking up until January about a PPD and stopped after that admission. Admission again in may for a flare up and pneumonia. She has a cough on admission and some sputum. CT showed some GGO findings. She states she does take inhalers albuterol  and also is on breo. She states she is currently on a steroid taper. Denies having chest pain but recently had a stent placed 15 years ago,   Office Spirometry Results:     ROS  General: (-) fever, (-) chills, (-) night sweats, (-) weakness Skin: (-) rashes, (-) itching,. Eyes: (-) visual changes, (-) redness, (-) itching. Nose and Sinuses: (-) nasal stuffiness or itchiness, (-) postnasal drip, (-) nosebleeds, (-) sinus trouble. Mouth and Throat: (-) sore throat, (-) hoarseness. Neck: (-) swollen glands, (-) enlarged thyroid , (-) neck pain. Respiratory: - cough, (-) bloody sputum, + shortness of breath, + wheezing. Cardiovascular: - ankle swelling, (-) chest pain. Lymphatic: (-) lymph node enlargement. Neurologic: (-) numbness, (-) tingling. Psychiatric: (-) anxiety, (-) depression   Current Medication: Outpatient Encounter Medications as of 11/24/2023  Medication Sig   albuterol  (VENTOLIN  HFA) 108 (90 Base) MCG/ACT inhaler Inhale 2 puffs into the lungs every 6 (six) hours as needed for wheezing or shortness of breath.   amiodarone  (PACERONE ) 400 MG tablet Take 1 tablet (400 mg total) by mouth daily.   apixaban  (ELIQUIS ) 5 MG TABS tablet TAKE 1 TABLET BY MOUTH TWICE DAILY   citalopram   (CELEXA ) 20 MG tablet TAKE 1 TABLET BY MOUTH ONCE DAILY   cyclobenzaprine  (FLEXERIL ) 5 MG tablet Take 5 mg by mouth 2 (two) times daily as needed for muscle spasms.   dapagliflozin  propanediol (FARXIGA ) 10 MG TABS tablet Take 1 tablet (10 mg total) by mouth daily.   fentaNYL  (DURAGESIC ) 75 MCG/HR Place 1 patch onto the skin every 3 (three) days.   fluticasone  (FLONASE ) 50 MCG/ACT nasal spray Place 2 sprays into both nostrils daily.   fluticasone  furoate-vilanterol (BREO ELLIPTA ) 100-25 MCG/ACT AEPB Inhale 1 puff into the lungs daily.   furosemide  (LASIX ) 20 MG tablet TAKE 1 TABLET BY MOUTH ONCE DAILY   gabapentin  (NEURONTIN ) 400 MG capsule Take 400 mg by mouth 2 (two) times daily.   glucose blood (ONETOUCH VERIO) test strip Use as instructed   isosorbide  mononitrate (IMDUR ) 30 MG 24 hr tablet TAKE 1 TABLET BY MOUTH ONCE DAILY   levothyroxine  (SYNTHROID ) 50 MCG tablet Take 1 tablet (50 mcg total) by mouth daily at 6 (six) AM.   losartan  (COZAAR ) 50 MG tablet TAKE 1 TABLET BY MOUTH ONCE DAILY   Multiple Vitamin (MULTIVITAMIN WITH MINERALS) TABS tablet Take 1 tablet by mouth daily.   naloxone (NARCAN) nasal spray 4 mg/0.1 mL Place 1 spray into the nose once.   omeprazole (PRILOSEC) 40 MG capsule TAKE 1 CAPSULE BY MOUTH ONCE DAILY   ondansetron  (ZOFRAN ) 4 MG tablet Take 4 mg by mouth every 8 (eight) hours as needed.   Oxycodone  HCl 10 MG TABS Take 10 mg by mouth daily.   rifaximin  (XIFAXAN ) 550 MG TABS tablet TAKE  1 TABLET BY MOUTH TWICE DAILY   rosuvastatin  (CRESTOR ) 20 MG tablet TAKE 1 TABLET BY MOUTH ONCE EVERY EVENING   sitaGLIPtin (JANUVIA) 100 MG tablet TAKE 1 TABLET BY MOUTH ONCE DAILY   SUMAtriptan  (IMITREX ) 100 MG tablet TAKE 1 TABLET BY MOUTH AS NEEDED FOR HEADACHE. MAX 2 TABLETS PER DAY   tolterodine (DETROL LA) 2 MG 24 hr capsule TAKE 1 CAPSULE BY MOUTH ONCE DAILY   vitamin C (ASCORBIC ACID ) 500 MG tablet Take 500 mg by mouth daily.   No facility-administered encounter medications on  file as of 11/24/2023.    Surgical History: Past Surgical History:  Procedure Laterality Date   CAROTID STENT INSERTION  2011   CHOLECYSTECTOMY  2009   COLONOSCOPY  2007   COLONOSCOPY WITH PROPOFOL  N/A 08/18/2016   Procedure: COLONOSCOPY WITH PROPOFOL ;  Surgeon: Marnee Sink, MD;  Location: [facility];  Service: Endoscopy;  Laterality: N/A;   FOOT SURGERY     SHOULDER SURGERY Bilateral 2004   UPPER GI ENDOSCOPY      Medical History: Past Medical History:  Diagnosis Date   Arthritis    Atypical squamous cells of undetermined significance (ASCUS) on Papanicolaou smear of cervix 10/08/2016   Chest pain 09/20/2020   COPD (chronic obstructive pulmonary disease) (HCC)    Diabetes mellitus without complication (HCC)    Diarrhea 01/09/2021   Elevated troponin    Glaucoma 2010   History of adenomatous polyp of colon    Hypertension 2005   Hypomagnesemia 01/09/2021   Knee pain 05/06/2022   Low back pain of over 3 months duration 05/06/2022   Personal history of colonic polyps    Respiratory distress 10/25/2015   Sepsis (HCC) 01/09/2021   Thyroid  cyst    UTI (urinary tract infection) 01/09/2021   Wheezing 10/25/2015    Family History: Family History  Problem Relation Age of Onset   Hypertension Mother     Social History: Social History   Socioeconomic History   Marital status: Widowed    Spouse name: Not on file   Number of children: Not on file   Years of education: Not on file   Highest education level: Not on file  Occupational History   Not on file  Tobacco Use   Smoking status: Every Day    Types: E-cigarettes   Smokeless tobacco: Never  Vaping Use   Vaping status: Never Used  Substance and Sexual Activity   Alcohol use: No   Drug use: No   Sexual activity: Not Currently    Birth control/protection: Post-menopausal  Other Topics Concern   Not on file  Social History Narrative   Not on file   Social Drivers of Health   Financial Resource  Strain: Not on file  Food Insecurity: No Food Insecurity (11/15/2023)   Hunger Vital Sign    Worried About Running Out of Food in the Last Year: Never true    Ran Out of Food in the Last Year: Never true  Transportation Needs: No Transportation Needs (11/15/2023)   PRAPARE - Administrator, Civil Service (Medical): No    Lack of Transportation (Non-Medical): No  Physical Activity: Not on file  Stress: Not on file  Social Connections: Moderately Isolated (11/15/2023)   Social Connection and Isolation Panel [NHANES]    Frequency of Communication with Friends and Family: More than three times a week    Frequency of Social Gatherings with Friends and Family: Once a week    Attends Religious Services:  More than 4 times per year    Active Member of Clubs or Organizations: No    Attends Banker Meetings: Never    Marital Status: Widowed  Intimate Partner Violence: Not At Risk (11/15/2023)   Humiliation, Afraid, Rape, and Kick questionnaire    Fear of Current or Ex-Partner: No    Emotionally Abused: No    Physically Abused: No    Sexually Abused: No    Vital Signs: Blood pressure 115/80, pulse 87, temperature 98.3 F (36.8 C), resp. rate 16, height 5\' 4"  (1.626 m), weight 226 lb (102.5 kg), SpO2 98%.  Examination: General Appearance: The patient is well-developed, well-nourished, and in no distress. Skin: Gross inspection of skin unremarkable. Head: normocephalic, no gross deformities. Eyes: no gross deformities noted. ENT: ears appear grossly normal no exudates. Neck: Supple. No thyromegaly. No LAD. Respiratory: distant rhonchi. Cardiovascular: Normal S1 and S2 without murmur or rub. Extremities: No cyanosis. pulses are equal. Neurologic: Alert and oriented. No involuntary movements.  LABS: Recent Results (from the past 2160 hours)  CBC     Status: None   Collection Time: 11/15/23  2:13 AM  Result Value Ref Range   WBC 6.4 4.0 - 10.5 K/uL   RBC 4.04 3.87  - 5.11 MIL/uL   Hemoglobin 12.4 12.0 - 15.0 g/dL   HCT 13.0 86.5 - 78.4 %   MCV 94.1 80.0 - 100.0 fL   MCH 30.7 26.0 - 34.0 pg   MCHC 32.6 30.0 - 36.0 g/dL   RDW 69.6 29.5 - 28.4 %   Platelets 225 150 - 400 K/uL   nRBC 0.0 0.0 - 0.2 %    Comment: Performed at [facility], [address], [place], [place] [address]  Basic metabolic panel     Status: Abnormal   Collection Time: 11/15/23  2:13 AM  Result Value Ref Range   Sodium 137 135 - 145 mmol/L   Potassium 4.3 3.5 - 5.1 mmol/L   Chloride 102 98 - 111 mmol/L   CO2 24 22 - 32 mmol/L   Glucose, Bld 137 (H) 70 - 99 mg/dL    Comment: Glucose reference range applies only to samples taken after fasting for at least 8 hours.   BUN 25 (H) 8 - 23 mg/dL   Creatinine, Ser 0.10 (H) 0.44 - 1.00 mg/dL   Calcium  8.8 (L) 8.9 - 10.3 mg/dL   GFR, Estimated 51 (L) >60 mL/min    Comment: (NOTE) Calculated using the CKD-EPI Creatinine Equation (2021)    Anion gap 11 5 - 15    Comment: Performed at [facility], [address] [place], [place] [address]  Troponin I (High Sensitivity)     Status: None   Collection Time: 11/15/23  2:13 AM  Result Value Ref Range   Troponin I (High Sensitivity) 6 <18 ng/L    Comment: (NOTE) Elevated high sensitivity troponin I (hsTnI) values and significant  changes across serial measurements may suggest ACS but many other  chronic and acute conditions are known to elevate hsTnI results.  Refer to the "Links" section for chest pain algorithms and additional  guidance. Performed at [facility], [address] [place], [place] [address]   Brain natriuretic peptide     Status: None   Collection Time: 11/15/23  2:14 AM  Result Value Ref Range   B Natriuretic Peptide 44.0 0.0 - 100.0 pg/mL    Comment: Performed at [facility], [address], [place], [place] [address]  Troponin I (High Sensitivity)     Status: None   Collection Time: 11/15/23  3:33 AM  Result Value Ref  Range   Troponin I (High Sensitivity) 5 <18 ng/L    Comment: (NOTE) Elevated high sensitivity troponin I (hsTnI) values and significant  changes across serial measurements may suggest ACS but many other  chronic and acute conditions are known to elevate hsTnI results.  Refer to the "Links" section for chest pain algorithms and additional  guidance. Performed at [facility], [address] [place], [place] [address]   Glucose, capillary     Status: Abnormal   Collection Time: 11/15/23  6:19 AM  Result Value Ref Range   Glucose-Capillary 173 (H) 70 - 99 mg/dL    Comment: Glucose reference range applies only to samples taken after fasting for at least 8 hours.  Hemoglobin A1c     Status: Abnormal   Collection Time: 11/15/23  7:43 AM  Result Value Ref Range   Hgb A1c MFr Bld 6.7 (H) 4.8 - 5.6 %    Comment: (NOTE) Pre diabetes:          5.7%-6.4%  Diabetes:              >6.4%  Glycemic control for   <7.0% adults with diabetes    Mean Plasma Glucose 145.59 mg/dL    Comment: Performed at [facility], [address] [place], [place] [address]  Glucose, capillary     Status: Abnormal   Collection Time: 11/15/23  8:39 AM  Result Value Ref Range   Glucose-Capillary 222 (H) 70 - 99 mg/dL    Comment: Glucose reference range applies only to samples taken after fasting for at least 8 hours.   Comment 1 Notify RN    Comment 2 Document in Chart   Glucose, capillary     Status: Abnormal   Collection Time: 11/15/23 11:46 AM  Result Value Ref Range   Glucose-Capillary 200 (H) 70 - 99 mg/dL    Comment: Glucose reference range applies only to samples taken after fasting for at least 8 hours.   Comment 1 Notify RN    Comment 2 Document in Chart   Glucose, capillary     Status: Abnormal   Collection Time: 11/15/23  4:21 PM  Result Value Ref Range   Glucose-Capillary 251 (H) 70 - 99 mg/dL    Comment: Glucose reference range applies only to samples taken after fasting for at  least 8 hours.   Comment 1 Notify RN    Comment 2 Document in Chart   Glucose, capillary     Status: Abnormal   Collection Time: 11/15/23  9:29 PM  Result Value Ref Range   Glucose-Capillary 215 (H) 70 - 99 mg/dL    Comment: Glucose reference range applies only to samples taken after fasting for at least 8 hours.  Basic metabolic panel with GFR     Status: Abnormal   Collection Time: 11/16/23  5:27 AM  Result Value Ref Range   Sodium 138 135 - 145 mmol/L   Potassium 4.2 3.5 - 5.1 mmol/L   Chloride 106 98 - 111 mmol/L   CO2 26 22 - 32 mmol/L   Glucose, Bld 197 (H) 70 - 99 mg/dL    Comment: Glucose reference range applies only to samples taken after fasting for at least 8 hours.   BUN 28 (H) 8 - 23 mg/dL   Creatinine, Ser 8.29 0.44 - 1.00 mg/dL   Calcium   9.2 8.9 - 10.3 mg/dL   GFR, Estimated >78 >29 mL/min    Comment: (NOTE) Calculated using the CKD-EPI Creatinine Equation (2021)    Anion gap 6 5 - 15    Comment: Performed at [facility], [address] [place], [place] [address]  Glucose, capillary     Status: Abnormal   Collection Time: 11/16/23  8:44 AM  Result Value Ref Range   Glucose-Capillary 277 (H) 70 - 99 mg/dL    Comment: Glucose reference range applies only to samples taken after fasting for at least 8 hours.  Glucose, capillary     Status: Abnormal   Collection Time: 11/16/23 11:54 AM  Result Value Ref Range   Glucose-Capillary 223 (H) 70 - 99 mg/dL    Comment: Glucose reference range applies only to samples taken after fasting for at least 8 hours.  Glucose, capillary     Status: Abnormal   Collection Time: 11/16/23  5:41 PM  Result Value Ref Range   Glucose-Capillary 198 (H) 70 - 99 mg/dL    Comment: Glucose reference range applies only to samples taken after fasting for at least 8 hours.  Glucose, capillary     Status: Abnormal   Collection Time: 11/16/23  9:19 PM  Result Value Ref Range   Glucose-Capillary 240 (H) 70 - 99 mg/dL    Comment:  Glucose reference range applies only to samples taken after fasting for at least 8 hours.  Glucose, capillary     Status: Abnormal   Collection Time: 11/16/23  9:35 PM  Result Value Ref Range   Glucose-Capillary 211 (H) 70 - 99 mg/dL    Comment: Glucose reference range applies only to samples taken after fasting for at least 8 hours.  Basic metabolic panel with GFR     Status: Abnormal   Collection Time: 11/17/23  4:13 AM  Result Value Ref Range   Sodium 137 135 - 145 mmol/L   Potassium 3.9 3.5 - 5.1 mmol/L   Chloride 105 98 - 111 mmol/L   CO2 24 22 - 32 mmol/L   Glucose, Bld 140 (H) 70 - 99 mg/dL    Comment: Glucose reference range applies only to samples taken after fasting for at least 8 hours.   BUN 32 (H) 8 - 23 mg/dL   Creatinine, Ser 0.86 0.44 - 1.00 mg/dL   Calcium  9.2 8.9 - 10.3 mg/dL   GFR, Estimated >57 >84 mL/min    Comment: (NOTE) Calculated using the CKD-EPI Creatinine Equation (2021)    Anion gap 8 5 - 15    Comment: Performed at [facility], [address] [place], [place] [address]  Glucose, capillary     Status: Abnormal   Collection Time: 11/17/23  8:23 AM  Result Value Ref Range   Glucose-Capillary 141 (H) 70 - 99 mg/dL    Comment: Glucose reference range applies only to samples taken after fasting for at least 8 hours.  Glucose, capillary     Status: Abnormal   Collection Time: 11/17/23 11:49 AM  Result Value Ref Range   Glucose-Capillary 136 (H) 70 - 99 mg/dL    Comment: Glucose reference range applies only to samples taken after fasting for at least 8 hours.    Radiology: CT Angio Chest PE W and/or Wo Contrast Result Date: 11/15/2023 CLINICAL DATA:  High probability pulmonary embolism EXAM: CT ANGIOGRAPHY CHEST WITH CONTRAST TECHNIQUE: Multidetector CT imaging of the chest was performed using the standard protocol during bolus administration of intravenous  contrast. Multiplanar CT image reconstructions and MIPs were obtained to evaluate the  vascular anatomy. RADIATION DOSE REDUCTION: This exam was performed according to the departmental dose-optimization program which includes automated exposure control, adjustment of the mA and/or kV according to patient size and/or use of iterative reconstruction technique. CONTRAST:  OMNIPAQUE  IOHEXOL  350 MG/ML SOLN COMPARISON:  07/26/2021 FINDINGS: Cardiovascular: Adequate opacification of the pulmonary arterial tree. No intraluminal filling defect identified to suggest acute pulmonary embolism. Central pulmonary arteries are dilated in keeping with changes of pulmonary arterial hypertension, unchanged. Moderate multi-vessel coronary artery calcification. Cardiac size mildly enlarged, unchanged. No pericardial effusion. Mild thyroid  goiter atherosclerotic calcification within the thoracic aorta. No aortic aneurysm. Mediastinum/Nodes: Thyroid  goiter. No pathologic thoracic adenopathy. Esophagus unremarkable. Moderate hiatal hernia. Lungs/Pleura: 7 mm noncalcified pulmonary nodule is seen within the right lower lobe at axial image # 77/5, stable in size since prior examination with demonstrating increasing density. Scattered parenchymal scarring again seen. Superimposed diffuse slightly asymmetric and upper lobe predominant ground-glass pulmonary infiltrate has developed, possibly infectious or inflammatory in the acute setting. No pneumothorax or pleural effusion. Central airways are widely patent. Upper Abdomen: No acute abnormality. Musculoskeletal: No acute bone abnormality. No lytic or blastic bone lesion. Osseous structures are age appropriate. Review of the MIP images confirms the above findings. IMPRESSION: 1. No acute pulmonary embolism. 2. Interval development of diffuse slightly asymmetric and upper lobe predominant ground-glass pulmonary infiltrate, possibly infectious or inflammatory in the acute setting. 3. 7 mm noncalcified pulmonary nodule within the right lower lobe, stable in size since prior  examination with demonstrating increasing density. Continued follow-up is warranted with Non-contrast chest CT at 6-12 months. If the nodule is stable at time of repeat CT, then future CT at 18-24 months (from today's scan) is considered optional for low-risk patients, but is recommended for high-risk patients. This recommendation follows the consensus statement: Guidelines for Management of Incidental Pulmonary Nodules Detected on CT Images: From the Fleischner Society 2017; Radiology 2017; 284:228-243. 4. Moderate multi-vessel coronary artery calcification. Stable mild cardiomegaly. 5. Morphologic changes in keeping with pulmonary arterial hypertension. 6. Moderate hiatal hernia. Aortic Atherosclerosis (ICD10-I70.0). Electronically Signed   By: Worthy Heads M.D.   On: 11/15/2023 04:02   DG Chest Portable 1 View Result Date: 11/15/2023 CLINICAL DATA:  Shortness of breath. Admission earlier this year for flu, discharged home on supplemental oxygen. EXAM: PORTABLE CHEST 1 VIEW COMPARISON:  Portable chest 08/01/2023 FINDINGS: There is mild cardiomegaly. No vascular congestion is seen. Mediastinum is stable with aortic atherosclerosis and tortuosity. There is asymmetric coarse interstitial change in the left lower lung field, which could be due to interstitial pneumonitis or scarring change. There is bilateral perihilar linear scarring or atelectasis. No pleural effusion. The remaining lungs are clear. There is thoracic spondylosis and mild dextroscoliosis. IMPRESSION: 1. Asymmetric coarse interstitial change in the left lower lung field, which could be due to interstitial pneumonitis or scarring change. This is similar to the last chest x-ray but developed since the portable chest from 07/29/2023. 2. Bilateral perihilar linear scarring or atelectasis. 3. Aortic atherosclerosis. 4. Mild cardiomegaly. Electronically Signed   By: Denman Fischer M.D.   On: 11/15/2023 02:47    No results found.  CT Angio Chest PE  W and/or Wo Contrast Result Date: 11/15/2023 CLINICAL DATA:  High probability pulmonary embolism EXAM: CT ANGIOGRAPHY CHEST WITH CONTRAST TECHNIQUE: Multidetector CT imaging of the chest was performed using the standard protocol during bolus administration of intravenous contrast. Multiplanar CT image reconstructions  and MIPs were obtained to evaluate the vascular anatomy. RADIATION DOSE REDUCTION: This exam was performed according to the departmental dose-optimization program which includes automated exposure control, adjustment of the mA and/or kV according to patient size and/or use of iterative reconstruction technique. CONTRAST:  OMNIPAQUE  IOHEXOL  350 MG/ML SOLN COMPARISON:  07/26/2021 FINDINGS: Cardiovascular: Adequate opacification of the pulmonary arterial tree. No intraluminal filling defect identified to suggest acute pulmonary embolism. Central pulmonary arteries are dilated in keeping with changes of pulmonary arterial hypertension, unchanged. Moderate multi-vessel coronary artery calcification. Cardiac size mildly enlarged, unchanged. No pericardial effusion. Mild thyroid  goiter atherosclerotic calcification within the thoracic aorta. No aortic aneurysm. Mediastinum/Nodes: Thyroid  goiter. No pathologic thoracic adenopathy. Esophagus unremarkable. Moderate hiatal hernia. Lungs/Pleura: 7 mm noncalcified pulmonary nodule is seen within the right lower lobe at axial image # 77/5, stable in size since prior examination with demonstrating increasing density. Scattered parenchymal scarring again seen. Superimposed diffuse slightly asymmetric and upper lobe predominant ground-glass pulmonary infiltrate has developed, possibly infectious or inflammatory in the acute setting. No pneumothorax or pleural effusion. Central airways are widely patent. Upper Abdomen: No acute abnormality. Musculoskeletal: No acute bone abnormality. No lytic or blastic bone lesion. Osseous structures are age appropriate. Review  of the MIP images confirms the above findings. IMPRESSION: 1. No acute pulmonary embolism. 2. Interval development of diffuse slightly asymmetric and upper lobe predominant ground-glass pulmonary infiltrate, possibly infectious or inflammatory in the acute setting. 3. 7 mm noncalcified pulmonary nodule within the right lower lobe, stable in size since prior examination with demonstrating increasing density. Continued follow-up is warranted with Non-contrast chest CT at 6-12 months. If the nodule is stable at time of repeat CT, then future CT at 18-24 months (from today's scan) is considered optional for low-risk patients, but is recommended for high-risk patients. This recommendation follows the consensus statement: Guidelines for Management of Incidental Pulmonary Nodules Detected on CT Images: From the Fleischner Society 2017; Radiology 2017; 284:228-243. 4. Moderate multi-vessel coronary artery calcification. Stable mild cardiomegaly. 5. Morphologic changes in keeping with pulmonary arterial hypertension. 6. Moderate hiatal hernia. Aortic Atherosclerosis (ICD10-I70.0). Electronically Signed   By: Worthy Heads M.D.   On: 11/15/2023 04:02   DG Chest Portable 1 View Result Date: 11/15/2023 CLINICAL DATA:  Shortness of breath. Admission earlier this year for flu, discharged home on supplemental oxygen. EXAM: PORTABLE CHEST 1 VIEW COMPARISON:  Portable chest 08/01/2023 FINDINGS: There is mild cardiomegaly. No vascular congestion is seen. Mediastinum is stable with aortic atherosclerosis and tortuosity. There is asymmetric coarse interstitial change in the left lower lung field, which could be due to interstitial pneumonitis or scarring change. There is bilateral perihilar linear scarring or atelectasis. No pleural effusion. The remaining lungs are clear. There is thoracic spondylosis and mild dextroscoliosis. IMPRESSION: 1. Asymmetric coarse interstitial change in the left lower lung field, which could be due to  interstitial pneumonitis or scarring change. This is similar to the last chest x-ray but developed since the portable chest from 07/29/2023. 2. Bilateral perihilar linear scarring or atelectasis. 3. Aortic atherosclerosis. 4. Mild cardiomegaly. Electronically Signed   By: Denman Fischer M.D.   On: 11/15/2023 02:47    Assessment and Plan: Patient Active Problem List   Diagnosis Date Noted   Paroxysmal atrial fibrillation (HCC) 11/15/2023   Chronic anticoagulation 11/15/2023   Acute on chronic respiratory failure with hypoxia (HCC) 11/15/2023   CAP (community acquired pneumonia) 11/15/2023   Pulmonary nodule 11/15/2023   Acute otitis media 09/28/2023   Thrush 08/04/2023  Hyperkalemia 08/02/2023   Generalized abdominal pain 08/02/2023   Anxiety 08/02/2023   Myocardial injury 07/30/2023   Hyponatremia 07/30/2023   Acute respiratory failure with hypoxia and hypercapnia (HCC) 07/29/2023   Hypotension 07/29/2023   Influenza A 07/29/2023   AKI (acute kidney injury) (HCC) 07/29/2023   Hypothyroidism 07/29/2023   HLD (hyperlipidemia) 01/09/2021   Depression 01/09/2021   Atrial fibrillation with rapid ventricular response (HCC) 01/09/2021   Dilated cbd, acquired 03/18/2017   Pain medication agreement signed 03/05/2017   Benign essential tremor 12/13/2015   Essential hypertension 10/25/2015   Goiter 10/25/2015   Thyroid  cyst 10/25/2015   Type II diabetes mellitus (HCC) 10/25/2015   COPD exacerbation (HCC) 10/23/2015   Gastroesophageal reflux disease with esophagitis 01/18/2015   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 Obstructive chronic bronchitis without exacerbation (HCC) (Primary) Severe chronic obstructive pulmonary disease with frequent exacerbations we will place the patient on  Daliresp  and recommend prescription for this and explained to her the side effects - Roflumilast  (DALIRESP ) 250 MCG TABS; Take 250 mcg by mouth daily.  Dispense: 28 tablet; Refill: 0  2. Smoker Patient needs to absolutely stop smoking consult was provided on this occasion  3. Oxygen dependent She is on oxygen when we took the oxygen off and due to pulse oximetry she immediately felt explained to the nursing staff continue using at the current levels  4. Shortness of breath Multifactorial including COPD oxygen dependence ongoing tobacco use - Pulmonary function test; Future  5. Community acquired pneumonia, unspecified laterality Will get a follow-up CT scan to determine clearing of the pulmonary tract - CT Chest Wo Contrast; Future    General Counseling: I have discussed the findings of the evaluation and examination with [facility].  I have also discussed any further diagnostic evaluation thatmay be needed or ordered today. Stephanie Small verbalizes understanding of the findings of todays visit. We also reviewed her medications today and discussed drug interactions and side effects including but not limited excessive drowsiness and altered mental states. We also discussed that there is always a risk not just to her but also people around her. she has been encouraged to call the office with any questions or concerns that should arise related to todays visit.  Orders Placed This Encounter  Procedures   CT Chest Wo Contrast    Standing Status:   Future    Number of Occurrences:   1    Expiration Date:   11/23/2024    Preferred imaging location?:   [facility]   Pulmonary function test    Standing Status:   Future    Expiration Date:   11/23/2024    Where should this test be performed?:       What type of PFT is being ordered?:   Full PFT     Time spent: 41  I have personally obtained a history, examined the patient, evaluated laboratory and imaging results, formulated the assessment  and plan and placed orders.    Cordie Deters, MD [facility]monary and Critical Care Sleep medicine

## 2023-11-25 ENCOUNTER — Telehealth: Payer: Self-pay | Admitting: Internal Medicine

## 2023-11-25 NOTE — Telephone Encounter (Signed)
 Notified daughter of chest CT appointment date, arrival time, location -[name]

## 2023-11-27 ENCOUNTER — Ambulatory Visit
Admission: RE | Admit: 2023-11-27 | Discharge: 2023-11-27 | Disposition: A | Discharge status: Home / Self Care | Source: Ambulatory Visit | Attending: Internal Medicine | Admitting: Internal Medicine

## 2023-11-27 DIAGNOSIS — J189 Pneumonia, unspecified organism: Secondary | ICD-10-CM | POA: Diagnosis present

## 2023-11-30 ENCOUNTER — Other Ambulatory Visit: Payer: Self-pay | Admitting: Internal Medicine

## 2023-11-30 DIAGNOSIS — I10 Essential (primary) hypertension: Secondary | ICD-10-CM

## 2023-11-30 DIAGNOSIS — N393 Stress incontinence (female) (male): Secondary | ICD-10-CM

## 2023-12-02 ENCOUNTER — Other Ambulatory Visit: Payer: Self-pay | Admitting: Internal Medicine

## 2023-12-02 DIAGNOSIS — J4489 Other specified chronic obstructive pulmonary disease: Secondary | ICD-10-CM

## 2023-12-16 ENCOUNTER — Encounter: Payer: Self-pay | Admitting: Family

## 2023-12-16 ENCOUNTER — Ambulatory Visit: Admitting: Family

## 2023-12-16 VITALS — BP 118/74 | HR 72 | Ht 64.0 in | Wt 221.8 lb

## 2023-12-16 DIAGNOSIS — G894 Chronic pain syndrome: Secondary | ICD-10-CM | POA: Diagnosis not present

## 2023-12-16 DIAGNOSIS — I10 Essential (primary) hypertension: Secondary | ICD-10-CM

## 2023-12-16 DIAGNOSIS — E1122 Type 2 diabetes mellitus with diabetic chronic kidney disease: Secondary | ICD-10-CM

## 2023-12-16 DIAGNOSIS — N1832 Chronic kidney disease, stage 3b: Secondary | ICD-10-CM

## 2023-12-16 MED ORDER — OMEPRAZOLE 40 MG PO CPDR: 40.0000 mg | DELAYED_RELEASE_CAPSULE | Freq: Two times a day (BID) | ORAL | 2 refills | Status: AC

## 2023-12-16 NOTE — Progress Notes (Signed)
 Established Patient Office Visit  Subjective:  Patient ID: Stephanie Small, female    DOB: 1946-02-03  Age: 78 y.o. MRN: [id number]  Chief Complaint  Patient presents with   Follow-up    Hospital follow up and med review     Patient here today for hospital f/u.  She has been doing well since getting out, but needs to review her medications as they are different now than when she was admitted and she is unsure of which things are which.     No other concerns at this time.   Past Medical History:  Diagnosis Date   Arthritis    Atypical squamous cells of undetermined significance (ASCUS) on Papanicolaou smear of cervix 10/08/2016   Chest pain 09/20/2020   COPD (chronic obstructive pulmonary disease) (HCC)    Diabetes mellitus without complication (HCC)    Diarrhea 01/09/2021   Elevated troponin    Glaucoma 2010   History of adenomatous polyp of colon    Hypertension 2005   Hypomagnesemia 01/09/2021   Knee pain 05/06/2022   Low back pain of over 3 months duration 05/06/2022   Personal history of colonic polyps    Respiratory distress 10/25/2015   Sepsis (HCC) 01/09/2021   Thyroid  cyst    UTI (urinary tract infection) 01/09/2021   Wheezing 10/25/2015    Past Surgical History:  Procedure Laterality Date   CAROTID STENT INSERTION  2011   CHOLECYSTECTOMY  2009   COLONOSCOPY  2007   COLONOSCOPY WITH PROPOFOL  N/A 08/18/2016   Procedure: COLONOSCOPY WITH PROPOFOL ;  Surgeon: Rogelia Copping, MD;  Location: [facility];  Service: Endoscopy;  Laterality: N/A;   FOOT SURGERY     SHOULDER SURGERY Bilateral 2004   UPPER GI ENDOSCOPY      Social History   Socioeconomic History   Marital status: Widowed    Spouse name: Not on file   Number of children: Not on file   Years of education: Not on file   Highest education level: Not on file  Occupational History   Not on file  Tobacco Use   Smoking status: Former    Types: E-cigarettes   Smokeless tobacco: Never   Vaping Use   Vaping status: Never Used  Substance and Sexual Activity   Alcohol use: No   Drug use: No   Sexual activity: Not Currently    Birth control/protection: Post-menopausal  Other Topics Concern   Not on file  Social History Narrative   Not on file   Social Drivers of Health   Financial Resource Strain: Not on file  Food Insecurity: No Food Insecurity (11/15/2023)   Hunger Vital Sign    Worried About Running Out of Food in the Last Year: Never true    Ran Out of Food in the Last Year: Never true  Transportation Needs: No Transportation Needs (11/15/2023)   PRAPARE - Administrator, Civil Service (Medical): No    Lack of Transportation (Non-Medical): No  Physical Activity: Not on file  Stress: Not on file  Social Connections: Moderately Isolated (11/15/2023)   Social Connection and Isolation Panel    Frequency of Communication with Friends and Family: More than three times a week    Frequency of Social Gatherings with Friends and Family: Once a week    Attends Religious Services: More than 4 times per year    Active Member of [employer] or Organizations: No    Attends Banker Meetings: Never    Marital  Status: Widowed  Intimate Partner Violence: Not At Risk (11/15/2023)   Humiliation, Afraid, Rape, and Kick questionnaire    Fear of Current or Ex-Partner: No    Emotionally Abused: No    Physically Abused: No    Sexually Abused: No    Family History  Problem Relation Age of Onset   Hypertension Mother     Allergies  Allergen Reactions   Shellfish-Derived Products Anaphylaxis and Rash    Allergic to oysters, not sure if she's allergic to shellfish.  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   Shellfish Allergy Rash    Review of Systems  All other systems reviewed and are negative.      Objective:   BP 118/74   Pulse 72   Ht 5' 4 (1.626 m)   Wt 221 lb 12.8 oz (100.6 kg)   SpO2 93%   BMI 38.07 kg/m   Vitals:    12/16/23 1036  BP: 118/74  Pulse: 72  Height: 5' 4 (1.626 m)  Weight: 221 lb 12.8 oz (100.6 kg)  SpO2: 93%  BMI (Calculated): 38.05    Physical Exam Vitals and nursing note reviewed.  Constitutional:      Appearance: Normal appearance. She is normal weight.  HENT:     Head: Normocephalic.  Eyes:     Extraocular Movements: Extraocular movements intact.     Conjunctiva/sclera: Conjunctivae normal.     Pupils: Pupils are equal, round, and reactive to light.  Cardiovascular:     Rate and Rhythm: Normal rate.  Pulmonary:     Effort: Pulmonary effort is normal.  Neurological:     General: No focal deficit present.     Mental Status: She is alert and oriented to person, place, and time. Mental status is at baseline.  Psychiatric:        Mood and Affect: Mood normal.        Behavior: Behavior normal.        Thought Content: Thought content normal.        Judgment: Judgment normal.      No results found for any visits on 12/16/23.  Recent Results (from the past 2160 hours)  CBC     Status: None   Collection Time: 11/15/23  2:13 AM  Result Value Ref Range   WBC 6.4 4.0 - 10.5 K/uL   RBC 4.04 3.87 - 5.11 MIL/uL   Hemoglobin 12.4 12.0 - 15.0 g/dL   HCT 61.9 63.9 - 53.9 %   MCV 94.1 80.0 - 100.0 fL   MCH 30.7 26.0 - 34.0 pg   MCHC 32.6 30.0 - 36.0 g/dL   RDW 86.8 88.4 - 84.4 %   Platelets 225 150 - 400 K/uL   nRBC 0.0 0.0 - 0.2 %    Comment: Performed at [facility], [address], [place], [place] [address]  Basic metabolic panel     Status: Abnormal   Collection Time: 11/15/23  2:13 AM  Result Value Ref Range   Sodium 137 135 - 145 mmol/L   Potassium 4.3 3.5 - 5.1 mmol/L   Chloride 102 98 - 111 mmol/L   CO2 24 22 - 32 mmol/L   Glucose, Bld 137 (H) 70 - 99 mg/dL    Comment: Glucose reference range applies only to samples taken after fasting for at least 8 hours.   BUN 25 (H) 8 - 23 mg/dL   Creatinine, Ser 8.87 (H) 0.44 - 1.00 mg/dL   Calcium  8.8 (  L)  8.9 - 10.3 mg/dL   GFR, Estimated 51 (L) >60 mL/min    Comment: (NOTE) Calculated using the CKD-EPI Creatinine Equation (2021)    Anion gap 11 5 - 15    Comment: Performed at [facility], [address] [place], [place] [address]  Troponin I (High Sensitivity)     Status: None   Collection Time: 11/15/23  2:13 AM  Result Value Ref Range   Troponin I (High Sensitivity) 6 <18 ng/L    Comment: (NOTE) Elevated high sensitivity troponin I (hsTnI) values and significant  changes across serial measurements may suggest ACS but many other  chronic and acute conditions are known to elevate hsTnI results.  Refer to the Links section for chest pain algorithms and additional  guidance. Performed at [facility], [address] [place], [place] [address]   Brain natriuretic peptide     Status: None   Collection Time: 11/15/23  2:14 AM  Result Value Ref Range   B Natriuretic Peptide 44.0 0.0 - 100.0 pg/mL    Comment: Performed at [facility], [address] [place], [place] [address]  Troponin I (High Sensitivity)     Status: None   Collection Time: 11/15/23  3:33 AM  Result Value Ref Range   Troponin I (High Sensitivity) 5 <18 ng/L    Comment: (NOTE) Elevated high sensitivity troponin I (hsTnI) values and significant  changes across serial measurements may suggest ACS but many other  chronic and acute conditions are known to elevate hsTnI results.  Refer to the Links section for chest pain algorithms and additional  guidance. Performed at [facility], [address] [place], [place] [address]   Glucose, capillary     Status: Abnormal   Collection Time: 11/15/23  6:19 AM  Result Value Ref Range   Glucose-Capillary 173 (H) 70 - 99 mg/dL    Comment: Glucose reference range applies only to samples taken after fasting for at least 8 hours.  Hemoglobin A1c     Status: Abnormal   Collection Time: 11/15/23  7:43 AM  Result Value Ref Range    Hgb A1c MFr Bld 6.7 (H) 4.8 - 5.6 %    Comment: (NOTE) Pre diabetes:          5.7%-6.4%  Diabetes:              >6.4%  Glycemic control for   <7.0% adults with diabetes    Mean Plasma Glucose 145.59 mg/dL    Comment: Performed at [facility], [address], [place], [place] [address]  Glucose, capillary     Status: Abnormal   Collection Time: 11/15/23  8:39 AM  Result Value Ref Range   Glucose-Capillary 222 (H) 70 - 99 mg/dL    Comment: Glucose reference range applies only to samples taken after fasting for at least 8 hours.   Comment 1 Notify RN    Comment 2 Document in Chart   Glucose, capillary     Status: Abnormal   Collection Time: 11/15/23 11:46 AM  Result Value Ref Range   Glucose-Capillary 200 (H) 70 - 99 mg/dL    Comment: Glucose reference range applies only to samples taken after fasting for at least 8 hours.   Comment 1 Notify RN    Comment 2 Document in Chart   Glucose, capillary     Status: Abnormal   Collection Time: 11/15/23  4:21 PM  Result Value Ref Range   Glucose-Capillary 251 (  H) 70 - 99 mg/dL    Comment: Glucose reference range applies only to samples taken after fasting for at least 8 hours.   Comment 1 Notify RN    Comment 2 Document in Chart   Glucose, capillary     Status: Abnormal   Collection Time: 11/15/23  9:29 PM  Result Value Ref Range   Glucose-Capillary 215 (H) 70 - 99 mg/dL    Comment: Glucose reference range applies only to samples taken after fasting for at least 8 hours.  Basic metabolic panel with GFR     Status: Abnormal   Collection Time: 11/16/23  5:27 AM  Result Value Ref Range   Sodium 138 135 - 145 mmol/L   Potassium 4.2 3.5 - 5.1 mmol/L   Chloride 106 98 - 111 mmol/L   CO2 26 22 - 32 mmol/L   Glucose, Bld 197 (H) 70 - 99 mg/dL    Comment: Glucose reference range applies only to samples taken after fasting for at least 8 hours.   BUN 28 (H) 8 - 23 mg/dL   Creatinine, Ser 9.05 0.44 - 1.00 mg/dL   Calcium  9.2 8.9 - 10.3  mg/dL   GFR, Estimated >39 >39 mL/min    Comment: (NOTE) Calculated using the CKD-EPI Creatinine Equation (2021)    Anion gap 6 5 - 15    Comment: Performed at [facility], [address] [place], [place] [address]  Glucose, capillary     Status: Abnormal   Collection Time: 11/16/23  8:44 AM  Result Value Ref Range   Glucose-Capillary 277 (H) 70 - 99 mg/dL    Comment: Glucose reference range applies only to samples taken after fasting for at least 8 hours.  Glucose, capillary     Status: Abnormal   Collection Time: 11/16/23 11:54 AM  Result Value Ref Range   Glucose-Capillary 223 (H) 70 - 99 mg/dL    Comment: Glucose reference range applies only to samples taken after fasting for at least 8 hours.  Glucose, capillary     Status: Abnormal   Collection Time: 11/16/23  5:41 PM  Result Value Ref Range   Glucose-Capillary 198 (H) 70 - 99 mg/dL    Comment: Glucose reference range applies only to samples taken after fasting for at least 8 hours.  Glucose, capillary     Status: Abnormal   Collection Time: 11/16/23  9:19 PM  Result Value Ref Range   Glucose-Capillary 240 (H) 70 - 99 mg/dL    Comment: Glucose reference range applies only to samples taken after fasting for at least 8 hours.  Glucose, capillary     Status: Abnormal   Collection Time: 11/16/23  9:35 PM  Result Value Ref Range   Glucose-Capillary 211 (H) 70 - 99 mg/dL    Comment: Glucose reference range applies only to samples taken after fasting for at least 8 hours.  Basic metabolic panel with GFR     Status: Abnormal   Collection Time: 11/17/23  4:13 AM  Result Value Ref Range   Sodium 137 135 - 145 mmol/L   Potassium 3.9 3.5 - 5.1 mmol/L   Chloride 105 98 - 111 mmol/L   CO2 24 22 - 32 mmol/L   Glucose, Bld 140 (H) 70 - 99 mg/dL    Comment: Glucose reference range applies only to samples taken after fasting for at least 8 hours.   BUN 32 (H) 8 - 23 mg/dL   Creatinine, Ser 9.02 0.44 - 1.00 mg/dL  Calcium   9.2 8.9 - 10.3 mg/dL   GFR, Estimated >39 >39 mL/min    Comment: (NOTE) Calculated using the CKD-EPI Creatinine Equation (2021)    Anion gap 8 5 - 15    Comment: Performed at [facility], [address] [place], [place] [address]  Glucose, capillary     Status: Abnormal   Collection Time: 11/17/23  8:23 AM  Result Value Ref Range   Glucose-Capillary 141 (H) 70 - 99 mg/dL    Comment: Glucose reference range applies only to samples taken after fasting for at least 8 hours.  Glucose, capillary     Status: Abnormal   Collection Time: 11/17/23 11:49 AM  Result Value Ref Range   Glucose-Capillary 136 (H) 70 - 99 mg/dL    Comment: Glucose reference range applies only to samples taken after fasting for at least 8 hours.        Assessment & Plan Chronic pain syndrome Patient is seen by Pain Management, who manage this condition.  She is well controlled with current therapy.   Will defer to them for further changes to plan of care.  Essential hypertension Blood pressure well controlled with current medications.  Continue current therapy.  Will reassess at follow up.   Type 2 diabetes mellitus with stage 3b chronic kidney disease, without long-term current use of insulin  (HCC) Continue current diabetes POC, as patient has been well controlled on current regimen.  Will adjust meds if needed based on labs.      No follow-ups on file.   Total time spent: 20 minutes  ALAN CHRISTELLA ARRANT, FNP  12/16/2023   This document may have been prepared by [facility] Voice Recognition software and as such may include unintentional dictation errors.

## 2023-12-23 ENCOUNTER — Telehealth: Payer: Self-pay | Admitting: Family

## 2023-12-23 MED ORDER — FLUCONAZOLE 150 MG PO TABS: 150.0000 mg | ORAL_TABLET | ORAL | 0 refills | Status: AC

## 2023-12-23 NOTE — Telephone Encounter (Signed)
 Can we have more information please?  Which same issue?

## 2023-12-23 NOTE — Telephone Encounter (Signed)
 Patient's daughter left VM that she is having the same issue again and needs another round of meds.The daughter is currently out of town and cannot bring her in but can send someone to pick up the meds and take them to her. Please advise.

## 2023-12-29 ENCOUNTER — Other Ambulatory Visit: Payer: Self-pay | Admitting: Internal Medicine

## 2023-12-29 ENCOUNTER — Other Ambulatory Visit: Payer: Self-pay | Admitting: Family

## 2023-12-29 DIAGNOSIS — J4489 Other specified chronic obstructive pulmonary disease: Secondary | ICD-10-CM

## 2024-01-05 ENCOUNTER — Emergency Department

## 2024-01-05 ENCOUNTER — Emergency Department
Admission: EM | Admit: 2024-01-05 | Discharge: 2024-01-06 | Disposition: A | Discharge status: Home / Self Care | Attending: Emergency Medicine | Admitting: Emergency Medicine

## 2024-01-05 ENCOUNTER — Encounter: Payer: Self-pay | Admitting: Emergency Medicine

## 2024-01-05 ENCOUNTER — Other Ambulatory Visit: Payer: Self-pay

## 2024-01-05 DIAGNOSIS — Z7901 Long term (current) use of anticoagulants: Secondary | ICD-10-CM | POA: Diagnosis not present

## 2024-01-05 DIAGNOSIS — K5900 Constipation, unspecified: Secondary | ICD-10-CM | POA: Insufficient documentation

## 2024-01-05 DIAGNOSIS — R0602 Shortness of breath: Secondary | ICD-10-CM | POA: Diagnosis not present

## 2024-01-05 DIAGNOSIS — R1030 Lower abdominal pain, unspecified: Secondary | ICD-10-CM | POA: Diagnosis present

## 2024-01-05 DIAGNOSIS — J449 Chronic obstructive pulmonary disease, unspecified: Secondary | ICD-10-CM | POA: Insufficient documentation

## 2024-01-05 DIAGNOSIS — K2900 Acute gastritis without bleeding: Secondary | ICD-10-CM | POA: Diagnosis not present

## 2024-01-05 LAB — COMPREHENSIVE METABOLIC PANEL WITH GFR
ALT: 22 U/L (ref 0–44)
AST: 20 U/L (ref 15–41)
Albumin: 3.7 g/dL (ref 3.5–5.0)
Alkaline Phosphatase: 89 U/L (ref 38–126)
Anion gap: 11 (ref 5–15)
BUN: 23 mg/dL (ref 8–23)
CO2: 25 mmol/L (ref 22–32)
Calcium: 9.4 mg/dL (ref 8.9–10.3)
Chloride: 102 mmol/L (ref 98–111)
Creatinine, Ser: 0.97 mg/dL (ref 0.44–1.00)
GFR, Estimated: >60 mL/min (ref >60)
Glucose, Bld: 152 mg/dL — ABNORMAL HIGH (ref 70–99)
Potassium: 4 mmol/L (ref 3.5–5.1)
Sodium: 138 mmol/L (ref 135–145)
Total Bilirubin: 0.7 mg/dL (ref 0.0–1.2)
Total Protein: 6.2 g/dL — ABNORMAL LOW (ref 6.5–8.1)

## 2024-01-05 LAB — URINALYSIS, ROUTINE W REFLEX MICROSCOPIC
Bacteria, UA: NONE SEEN
Bilirubin Urine: NEGATIVE
Glucose, UA: >=500 mg/dL — AB
Hgb urine dipstick: NEGATIVE
Ketones, ur: NEGATIVE mg/dL
Leukocytes,Ua: NEGATIVE
Nitrite: NEGATIVE
Protein, ur: NEGATIVE mg/dL
Specific Gravity, Urine: 1.025 (ref 1.005–1.030)
pH: 5 (ref 5.0–8.0)

## 2024-01-05 LAB — CBC
HCT: 37 % (ref 36.0–46.0)
Hemoglobin: 11.7 g/dL — ABNORMAL LOW (ref 12.0–15.0)
MCH: 29.9 pg (ref 26.0–34.0)
MCHC: 31.6 g/dL (ref 30.0–36.0)
MCV: 94.6 fL (ref 80.0–100.0)
Platelets: 214 K/uL (ref 150–400)
RBC: 3.91 MIL/uL (ref 3.87–5.11)
RDW: 13.3 % (ref 11.5–15.5)
WBC: 6.3 K/uL (ref 4.0–10.5)
nRBC: 0 % (ref 0.0–0.2)

## 2024-01-05 LAB — LIPASE, BLOOD: Lipase: 24 U/L (ref 11–51)

## 2024-01-05 LAB — TROPONIN I (HIGH SENSITIVITY): Troponin I (High Sensitivity): 8 ng/L (ref <18)

## 2024-01-05 LAB — CBG MONITORING, ED: Glucose-Capillary: 140 mg/dL — ABNORMAL HIGH (ref 70–99)

## 2024-01-05 MED ORDER — ACETAMINOPHEN 500 MG PO TABS
1000.0000 mg | ORAL_TABLET | Freq: Once | ORAL | Status: AC
  Administered 2024-01-06: 1000 mg via ORAL
  Filled 2024-01-05: qty 2

## 2024-01-05 NOTE — ED Triage Notes (Addendum)
 Pt reports increased SOB over last 2 hours with weakness, lethargy and upper abdominal cramping. Hx/o CPOD on 2L oxygen chronic. Pt with labored breathing in triage, no audible wheezing. Pt placed on hospital O2 tank in triage. Pt reports nausea with 1 episode of vomiting prior to arrival.

## 2024-01-05 NOTE — ED Provider Notes (Signed)
 [facility] Provider Note    Event Date/Time   First MD Initiated Contact with Patient 01/05/24 2258     (approximate)   History   Shortness of Breath   HPI  Stephanie Small is a 78 y.o. female with COPD, A-fib on Eliquis , on 2 L of oxygen at baseline who comes in with concerns for shortness of breath.  Patient reportedly started feeling unwell just prior to coming in.  I reviewed patient's hospital admission from 5/18 until 10/2018 25 where patient was admitted for COPD exacerbation, pneumonia  Physical Exam   Triage Vital Signs: ED Triage Vitals  Encounter Vitals Group     BP 01/05/24 2013 (!) 135/117     Girls Systolic BP Percentile --      Girls Diastolic BP Percentile --      Boys Systolic BP Percentile --      Boys Diastolic BP Percentile --      Pulse Rate 01/05/24 2013 (!) 58     Resp 01/05/24 2013 (!) 27     Temp 01/05/24 2013 99 F (37.2 C)     Temp Source 01/05/24 2013 Oral     SpO2 01/05/24 2013 92 %     Weight 01/05/24 2014 222 lb 10.6 oz (101 kg)     Height 01/05/24 2014 5' 4 (1.626 m)     Head Circumference --      Peak Flow --      Pain Score --      Pain Loc --      Pain Education --      Exclude from Growth Chart --     Most recent vital signs: Vitals:   01/05/24 2013  BP: (!) 135/117  Pulse: (!) 58  Resp: (!) 27  Temp: 99 F (37.2 C)  SpO2: 92%     General: Awake, no distress.  CV:  Good peripheral perfusion.  Resp:  Normal effort.  Abd:  No distention.  Other:     ED Results / Procedures / Treatments   Labs (all labs ordered are listed, but only abnormal results are displayed) Labs Reviewed  COMPREHENSIVE METABOLIC PANEL WITH GFR - Abnormal; Notable for the following components:      Result Value   Glucose, Bld 152 (*)    Total Protein 6.2 (*)    All other components within normal limits  CBC - Abnormal; Notable for the following components:   Hemoglobin 11.7 (*)    All other components within  normal limits  URINALYSIS, ROUTINE W REFLEX MICROSCOPIC - Abnormal; Notable for the following components:   Color, Urine YELLOW (*)    APPearance CLEAR (*)    Glucose, UA >=500 (*)    All other components within normal limits  LIPASE, BLOOD  CBG MONITORING, ED  TROPONIN I (HIGH SENSITIVITY)  TROPONIN I (HIGH SENSITIVITY)     EKG  My interpretation of EKG:  Sinus bradycardia rate of 54 without any ST elevation, no T wave inversions, normal intervals  RADIOLOGY I have reviewed the xray personally and interpreted no evidence of pneumonia   PROCEDURES:  Critical Care performed: {CriticalCareYesNo:19197::Yes, see critical care procedure note(s),No}  Procedures   MEDICATIONS ORDERED IN ED: Medications - No data to display   IMPRESSION / MDM / ASSESSMENT AND PLAN / ED COURSE  I reviewed the triage vital signs and the nursing notes.   Patient's presentation is most consistent with acute presentation with potential threat to life or bodily  function.   Patient comes in with worsening shortness of breath differential includes COPD, pneumonia, COVID, flu.  Seems less likely to be associated with PE given patient on Eliquis  and reports being compliant.  Patient's glucose is normal urine without evidence of UTI CMP reassuring CBC reassuring troponin was negative lipase normal  The patient is on the cardiac monitor to evaluate for evidence of arrhythmia and/or significant heart rate changes.      FINAL CLINICAL IMPRESSION(S) / ED DIAGNOSES   Final diagnoses:  None     Rx / DC Orders   ED Discharge Orders     None        Note:  This document was prepared using Dragon voice recognition software and may include unintentional dictation errors.

## 2024-01-05 NOTE — ED Notes (Signed)
 Cbg 140

## 2024-01-06 ENCOUNTER — Encounter: Admitting: Internal Medicine

## 2024-01-06 ENCOUNTER — Emergency Department

## 2024-01-06 LAB — RESP PANEL BY RT-PCR (RSV, FLU A&B, COVID)  RVPGX2
Influenza A by PCR: NEGATIVE
Influenza B by PCR: NEGATIVE
Resp Syncytial Virus by PCR: NEGATIVE
SARS Coronavirus 2 by RT PCR: NEGATIVE

## 2024-01-06 LAB — BRAIN NATRIURETIC PEPTIDE: B Natriuretic Peptide: 281.4 pg/mL — ABNORMAL HIGH (ref 0.0–100.0)

## 2024-01-06 LAB — TROPONIN I (HIGH SENSITIVITY): Troponin I (High Sensitivity): 8 ng/L (ref <18)

## 2024-01-06 MED ORDER — SUCRALFATE 1 G PO TABS: 1.0000 g | ORAL_TABLET | Freq: Three times a day (TID) | ORAL | 0 refills | Status: DC

## 2024-01-06 MED ORDER — IOHEXOL 300 MG/ML  SOLN
100.0000 mL | Freq: Once | INTRAMUSCULAR | Status: AC | PRN
  Administered 2024-01-06: 100 mL via INTRAVENOUS

## 2024-01-06 NOTE — Discharge Instructions (Addendum)
 If you notice any irritation with eating you can take the Carafate  to try to help with this.  Continue to take your Protonix .  Called GI to make a follow-up appointment.  Return to the ER if you develop any black stools, worsening symptoms or any other concerns  Call cardiology to make a follow-up appointment to discuss your low heart rates and your CT imaging.  Follow-up for repeat CT scan in 1 year with your PCP  1. No acute abdominal or pelvic CT findings.  2. Gastritis, constipation and diverticulosis.  3. Hiatal hernia.  4. Aortic and coronary artery atherosclerosis.  5. 7 mm right lower lobe ground-glass nodule. A follow-up study is  recommended in 1 year to ensure continued stability.  6. Osteopenia and degenerative change.

## 2024-01-08 ENCOUNTER — Ambulatory Visit: Admitting: Cardiovascular Disease

## 2024-01-11 ENCOUNTER — Ambulatory Visit: Admitting: Cardiovascular Disease

## 2024-01-11 ENCOUNTER — Encounter: Payer: Self-pay | Admitting: Cardiovascular Disease

## 2024-01-11 VITALS — BP 108/50 | HR 65 | Ht 64.0 in | Wt 225.4 lb

## 2024-01-11 DIAGNOSIS — J449 Chronic obstructive pulmonary disease, unspecified: Secondary | ICD-10-CM | POA: Diagnosis not present

## 2024-01-11 DIAGNOSIS — I480 Paroxysmal atrial fibrillation: Secondary | ICD-10-CM

## 2024-01-11 DIAGNOSIS — E782 Mixed hyperlipidemia: Secondary | ICD-10-CM | POA: Diagnosis not present

## 2024-01-11 DIAGNOSIS — I10 Essential (primary) hypertension: Secondary | ICD-10-CM

## 2024-01-11 DIAGNOSIS — R0602 Shortness of breath: Secondary | ICD-10-CM | POA: Diagnosis not present

## 2024-01-11 DIAGNOSIS — F17211 Nicotine dependence, cigarettes, in remission: Secondary | ICD-10-CM | POA: Diagnosis not present

## 2024-01-11 MED ORDER — AMIODARONE HCL 200 MG PO TABS: 200.0000 mg | ORAL_TABLET | Freq: Every day | ORAL | 11 refills | Status: AC

## 2024-01-11 NOTE — Progress Notes (Signed)
 Cardiology Office Note   Date:  01/11/2024   ID:  Stephanie Small 1945/07/19, MRN [id number]  PCP:  Stephanie Alan HERO, FNP  Cardiologist:  Denyse Bathe, MD      History of Present Illness: Stephanie Small is a 78 y.o. female who presents for  Chief Complaint  Patient presents with   Follow-up    3 months    Has lot of SOB, especially on exertion, on home O2.      Past Medical History:  Diagnosis Date   Arthritis    Atypical squamous cells of undetermined significance (ASCUS) on Papanicolaou smear of cervix 10/08/2016   Chest pain 09/20/2020   COPD (chronic obstructive pulmonary disease) (HCC)    Diabetes mellitus without complication (HCC)    Diarrhea 01/09/2021   Elevated troponin    Glaucoma 2010   History of adenomatous polyp of colon    Hypertension 2005   Hypomagnesemia 01/09/2021   Knee pain 05/06/2022   Low back pain of over 3 months duration 05/06/2022   Personal history of colonic polyps    Respiratory distress 10/25/2015   Sepsis (HCC) 01/09/2021   Thyroid  cyst    UTI (urinary tract infection) 01/09/2021   Wheezing 10/25/2015     Past Surgical History:  Procedure Laterality Date   CAROTID STENT INSERTION  2011   CHOLECYSTECTOMY  2009   COLONOSCOPY  2007   COLONOSCOPY WITH PROPOFOL  N/A 08/18/2016   Procedure: COLONOSCOPY WITH PROPOFOL ;  Surgeon: Rogelia Copping, MD;  Location: [facility];  Service: Endoscopy;  Laterality: N/A;   FOOT SURGERY     SHOULDER SURGERY Bilateral 2004   UPPER GI ENDOSCOPY       Current Outpatient Medications  Medication Sig Dispense Refill   albuterol  (VENTOLIN  HFA) 108 (90 Base) MCG/ACT inhaler Inhale 2 puffs into the lungs every 6 (six) hours as needed for wheezing or shortness of breath.     amiodarone  (PACERONE ) 200 MG tablet Take 1 tablet (200 mg total) by mouth daily. 30 tablet 11   apixaban  (ELIQUIS ) 5 MG TABS tablet TAKE 1 TABLET BY MOUTH TWICE DAILY 180 tablet 3   citalopram  (CELEXA ) 20 MG  tablet TAKE 1 TABLET BY MOUTH ONCE DAILY 90 tablet 1   cyclobenzaprine  (FLEXERIL ) 5 MG tablet Take 5 mg by mouth 2 (two) times daily as needed for muscle spasms.     FARXIGA  10 MG TABS tablet TAKE 1 TABLET BY MOUTH ONCE DAILY 30 tablet 3   fentaNYL  (DURAGESIC ) 75 MCG/HR Place 1 patch onto the skin every 3 (three) days.     fluticasone  (FLONASE ) 50 MCG/ACT nasal spray Place 2 sprays into both nostrils daily.     fluticasone  furoate-vilanterol (BREO ELLIPTA ) 100-25 MCG/ACT AEPB Inhale 1 puff into the lungs daily.     furosemide  (LASIX ) 20 MG tablet TAKE 1 TABLET BY MOUTH ONCE DAILY 90 tablet 3   gabapentin  (NEURONTIN ) 400 MG capsule Take 400 mg by mouth 2 (two) times daily.     glucose blood (ONETOUCH VERIO) test strip Use as instructed 100 each 1   isosorbide  mononitrate (IMDUR ) 30 MG 24 hr tablet TAKE 1 TABLET BY MOUTH ONCE DAILY 90 tablet 2   levothyroxine  (SYNTHROID ) 50 MCG tablet Take 1 tablet (50 mcg total) by mouth daily at 6 (six) AM. 30 tablet 2   losartan  (COZAAR ) 50 MG tablet TAKE 1 TABLET BY MOUTH ONCE DAILY 90 tablet 2   Multiple Vitamin (MULTIVITAMIN WITH MINERALS) TABS tablet Take 1  tablet by mouth daily.     naloxone (NARCAN) nasal spray 4 mg/0.1 mL Place 1 spray into the nose once.     omeprazole  (PRILOSEC) 40 MG capsule Take 1 capsule (40 mg total) by mouth in the morning and at bedtime. 180 capsule 2   ondansetron  (ZOFRAN ) 4 MG tablet Take 4 mg by mouth every 8 (eight) hours as needed.     Oxycodone  HCl 10 MG TABS Take 10 mg by mouth daily.     rifaximin  (XIFAXAN ) 550 MG TABS tablet TAKE 1 TABLET BY MOUTH TWICE DAILY 180 tablet 2   Roflumilast  250 MCG TABS TAKE 1 TABLET BY MOUTH ONCE DAILY 30 tablet 0   rosuvastatin  (CRESTOR ) 20 MG tablet TAKE 1 TABLET BY MOUTH ONCE EVERY EVENING 90 tablet 2   sitaGLIPtin (JANUVIA) 100 MG tablet TAKE 1 TABLET BY MOUTH ONCE DAILY 90 tablet 3   sucralfate  (CARAFATE ) 1 g tablet Take 1 tablet (1 g total) by mouth 4 (four) times daily -  with meals  and at bedtime for 14 days. 56 tablet 0   SUMAtriptan  (IMITREX ) 100 MG tablet TAKE 1 TABLET BY MOUTH AS NEEDED FOR HEADACHE. MAX 2 TABLETS PER DAY 36 tablet 3   tolterodine (DETROL LA) 2 MG 24 hr capsule TAKE 1 CAPSULE BY MOUTH ONCE DAILY 90 capsule 1   vitamin C (ASCORBIC ACID ) 500 MG tablet Take 500 mg by mouth daily.     No current facility-administered medications for this visit.    Allergies:   Shellfish-derived products and Shellfish allergy    Social History:   reports that she has quit smoking. Her smoking use included e-cigarettes. She has never used smokeless tobacco. She reports that she does not drink alcohol and does not use drugs.   Family History:  family history includes Hypertension in her mother.    ROS:     Review of Systems  Constitutional: Negative.   HENT: Negative.    Eyes: Negative.   Respiratory: Negative.    Gastrointestinal: Negative.   Genitourinary: Negative.   Musculoskeletal: Negative.   Skin: Negative.   Neurological: Negative.   Endo/Heme/Allergies: Negative.   Psychiatric/Behavioral: Negative.    All other systems reviewed and are negative.     All other systems are reviewed and negative.    PHYSICAL EXAM: VS:  BP (!) 108/50   Pulse 65   Ht 5' 4 (1.626 m)   Wt 225 lb 6.4 oz (102.2 kg)   SpO2 93%   BMI 38.69 kg/m  , BMI Body mass index is 38.69 kg/m. Last weight:  Wt Readings from Last 3 Encounters:  01/11/24 225 lb 6.4 oz (102.2 kg)  01/05/24 222 lb 10.6 oz (101 kg)  12/16/23 221 lb 12.8 oz (100.6 kg)     Physical Exam Constitutional:      Appearance: Normal appearance.  Cardiovascular:     Rate and Rhythm: Normal rate and regular rhythm.     Heart sounds: Normal heart sounds.  Pulmonary:     Effort: Pulmonary effort is normal.     Breath sounds: Normal breath sounds.  Musculoskeletal:     Right lower leg: No edema.     Left lower leg: No edema.  Neurological:     Mental Status: She is alert.       EKG:    Recent Labs: 07/29/2023: TSH 0.058 08/14/2023: Magnesium  1.1 01/05/2024: ALT 22; B Natriuretic Peptide 281.4; BUN 23; Creatinine, Ser 0.97; Hemoglobin 11.7; Platelets 214; Potassium 4.0; Sodium 138  Lipid Panel    Component Value Date/Time   CHOL 117 01/30/2023 1550   TRIG 93 01/30/2023 1550   HDL 55 01/30/2023 1550   CHOLHDL 2.1 01/30/2023 1550   CHOLHDL 2.5 01/10/2021 0659   VLDL 13 01/10/2021 0659   LDLCALC 44 01/30/2023 1550      Other studies Reviewed: Additional studies/ records that were reviewed today include:  Review of the above records demonstrates:       No data to display            ASSESSMENT AND PLAN:    ICD-10-CM   1. SOB (shortness of breath)  R06.02 amiodarone  (PACERONE ) 200 MG tablet   Not much changed in breathing.  Did not start lasix  and farxiga   until today.    2. Essential hypertension  I10 amiodarone  (PACERONE ) 200 MG tablet    3. COPD with hypoxia (HCC)  J44.9 amiodarone  (PACERONE ) 200 MG tablet    4. Mixed hyperlipidemia  E78.2 amiodarone  (PACERONE ) 200 MG tablet    5. Paroxysmal atrial fibrillation (HCC)  I48.0 amiodarone  (PACERONE ) 200 MG tablet   In NSR on amiodrone 200 mg daily  as was on  400 mg.       Problem List Items Addressed This Visit       Cardiovascular and Mediastinum   Essential hypertension   Relevant Medications   amiodarone  (PACERONE ) 200 MG tablet   Paroxysmal atrial fibrillation (HCC)   Relevant Medications   amiodarone  (PACERONE ) 200 MG tablet     Other   HLD (hyperlipidemia)   Relevant Medications   amiodarone  (PACERONE ) 200 MG tablet   Other Visit Diagnoses       SOB (shortness of breath)    -  Primary   Not much changed in breathing.  Did not start lasix  and farxiga   until today.   Relevant Medications   amiodarone  (PACERONE ) 200 MG tablet     COPD with hypoxia (HCC)       Relevant Medications   amiodarone  (PACERONE ) 200 MG tablet          Disposition:   Return in about 2 months  (around 03/13/2024).    Total time spent: 30 minutes  Signed,  Denyse Bathe, MD  01/11/2024 10:56 AM    [facility]

## 2024-01-28 ENCOUNTER — Other Ambulatory Visit: Payer: Self-pay | Admitting: Family

## 2024-01-28 ENCOUNTER — Other Ambulatory Visit: Payer: Self-pay | Admitting: Internal Medicine

## 2024-01-28 DIAGNOSIS — J4489 Other specified chronic obstructive pulmonary disease: Secondary | ICD-10-CM

## 2024-02-08 ENCOUNTER — Other Ambulatory Visit: Payer: Self-pay | Admitting: Neurology

## 2024-02-08 DIAGNOSIS — I639 Cerebral infarction, unspecified: Secondary | ICD-10-CM

## 2024-02-08 DIAGNOSIS — G20A1 Parkinson's disease without dyskinesia, without mention of fluctuations: Secondary | ICD-10-CM

## 2024-02-10 NOTE — Assessment & Plan Note (Signed)
 Patient is seen by Pain Management, who manage this condition.  She is well controlled with current therapy.   Will defer to them for further changes to plan of care.

## 2024-02-10 NOTE — Assessment & Plan Note (Signed)
 Blood pressure well controlled with current medications.  Continue current therapy.  Will reassess at follow up.

## 2024-02-10 NOTE — Assessment & Plan Note (Signed)
 Continue current diabetes POC, as patient has been well controlled on current regimen.  Will adjust meds if needed based on labs.

## 2024-02-16 ENCOUNTER — Ambulatory Visit
Admission: RE | Admit: 2024-02-16 | Discharge: 2024-02-16 | Disposition: A | Discharge status: Home / Self Care | Source: Ambulatory Visit | Attending: Neurology | Admitting: Neurology

## 2024-02-16 DIAGNOSIS — G20A1 Parkinson's disease without dyskinesia, without mention of fluctuations: Secondary | ICD-10-CM | POA: Insufficient documentation

## 2024-02-16 DIAGNOSIS — I639 Cerebral infarction, unspecified: Secondary | ICD-10-CM | POA: Diagnosis present

## 2024-03-03 NOTE — Progress Notes (Deleted)
    GYNECOLOGY PROGRESS NOTE  Subjective:    Patient ID: KAMORI KITCHENS, female    DOB: 1945-11-11, 78 y.o.   MRN: [id number]  HPI  Patient is a 78 y.o. G61P4 female who presents for evaluation for recurrent yeast infections.  {Common ambulatory SmartLinks:19316}  Review of Systems {ros; complete:30496}   Objective:   There were no vitals taken for this visit. There is no height or weight on file to calculate BMI. General appearance: {general exam:16600} Abdomen: {abdominal exam:16834} Pelvic: {pelvic exam:16852::cervix normal in appearance,external genitalia normal,no adnexal masses or tenderness,no cervical motion tenderness,rectovaginal septum normal,uterus normal size, shape, and consistency,vagina normal without discharge} Extremities: {extremity exam:5109} Neurologic: {neuro exam:17854}   Assessment:   1. Vaginal discharge      Plan:   1. Vaginal discharge (Primary) ***      Damien Parsley, CNM [facility]

## 2024-03-04 ENCOUNTER — Ambulatory Visit: Admitting: Certified Nurse Midwife

## 2024-03-04 DIAGNOSIS — N898 Other specified noninflammatory disorders of vagina: Secondary | ICD-10-CM

## 2024-03-14 ENCOUNTER — Ambulatory Visit (INDEPENDENT_AMBULATORY_CARE_PROVIDER_SITE_OTHER): Admitting: Cardiovascular Disease

## 2024-03-14 ENCOUNTER — Encounter: Payer: Self-pay | Admitting: Cardiovascular Disease

## 2024-03-14 VITALS — BP 110/66 | HR 87 | Ht 63.0 in | Wt 219.6 lb

## 2024-03-14 DIAGNOSIS — I10 Essential (primary) hypertension: Secondary | ICD-10-CM

## 2024-03-14 DIAGNOSIS — E782 Mixed hyperlipidemia: Secondary | ICD-10-CM

## 2024-03-14 DIAGNOSIS — I480 Paroxysmal atrial fibrillation: Secondary | ICD-10-CM | POA: Diagnosis not present

## 2024-03-14 DIAGNOSIS — I340 Nonrheumatic mitral (valve) insufficiency: Secondary | ICD-10-CM

## 2024-03-14 DIAGNOSIS — J9601 Acute respiratory failure with hypoxia: Secondary | ICD-10-CM | POA: Diagnosis not present

## 2024-03-14 DIAGNOSIS — J9602 Acute respiratory failure with hypercapnia: Secondary | ICD-10-CM

## 2024-03-14 DIAGNOSIS — I5033 Acute on chronic diastolic (congestive) heart failure: Secondary | ICD-10-CM

## 2024-03-14 DIAGNOSIS — R0602 Shortness of breath: Secondary | ICD-10-CM

## 2024-03-14 NOTE — Progress Notes (Signed)
 Cardiology Office Note   Date:  03/14/2024   ID:  Sebastian, Dzik 1945/07/06, MRN [id number]  PCP:  Orlean Alan HERO, FNP  Cardiologist:  Denyse Bathe, MD      History of Present Illness: Stephanie Small is a 78 y.o. female who presents for  Chief Complaint  Patient presents with   Follow-up    2 month follow up    Breathing better, and less swelling of legs.      Past Medical History:  Diagnosis Date   Arthritis    Atypical squamous cells of undetermined significance (ASCUS) on Papanicolaou smear of cervix 10/08/2016   Chest pain 09/20/2020   COPD (chronic obstructive pulmonary disease) (HCC)    Diabetes mellitus without complication (HCC)    Diarrhea 01/09/2021   Elevated troponin    Glaucoma 2010   History of adenomatous polyp of colon    Hypertension 2005   Hypomagnesemia 01/09/2021   Knee pain 05/06/2022   Low back pain of over 3 months duration 05/06/2022   Personal history of colonic polyps    Respiratory distress 10/25/2015   Sepsis (HCC) 01/09/2021   Thyroid  cyst    UTI (urinary tract infection) 01/09/2021   Wheezing 10/25/2015     Past Surgical History:  Procedure Laterality Date   CAROTID STENT INSERTION  2011   CHOLECYSTECTOMY  2009   COLONOSCOPY  2007   COLONOSCOPY WITH PROPOFOL  N/A 08/18/2016   Procedure: COLONOSCOPY WITH PROPOFOL ;  Surgeon: Rogelia Copping, MD;  Location: [facility];  Service: Endoscopy;  Laterality: N/A;   FOOT SURGERY     SHOULDER SURGERY Bilateral 2004   UPPER GI ENDOSCOPY       Current Outpatient Medications  Medication Sig Dispense Refill   amiodarone  (PACERONE ) 200 MG tablet Take 1 tablet (200 mg total) by mouth daily. 30 tablet 11   apixaban  (ELIQUIS ) 5 MG TABS tablet TAKE 1 TABLET BY MOUTH TWICE DAILY 180 tablet 3   citalopram  (CELEXA ) 20 MG tablet TAKE 1 TABLET BY MOUTH ONCE DAILY 90 tablet 1   cyclobenzaprine  (FLEXERIL ) 5 MG tablet Take 5 mg by mouth 2 (two) times daily as needed for muscle  spasms.     diclofenac  Sodium (VOLTAREN ) 1 % GEL 1 (one) time each day if needed     FARXIGA  10 MG TABS tablet TAKE 1 TABLET BY MOUTH ONCE DAILY 30 tablet 3   fluconazole  (DIFLUCAN ) 100 MG tablet Take 100 mg by mouth daily.     fluticasone  furoate-vilanterol (BREO ELLIPTA ) 100-25 MCG/ACT AEPB Inhale 1 puff into the lungs daily.     furosemide  (LASIX ) 20 MG tablet TAKE 1 TABLET BY MOUTH ONCE DAILY 90 tablet 3   gabapentin  (NEURONTIN ) 400 MG capsule Take 400 mg by mouth 2 (two) times daily.     glucose blood (ONETOUCH VERIO) test strip Use as instructed 100 each 1   isosorbide  mononitrate (IMDUR ) 30 MG 24 hr tablet TAKE 1 TABLET BY MOUTH ONCE DAILY 90 tablet 2   levothyroxine  (SYNTHROID ) 50 MCG tablet TAKE 1 TABLET BY MOUTH ONCE DAILY ON AN EMPTY STOMACH. WAIT 30 MINUTES BEFORE TAKING OTHER MEDS. 30 tablet 2   losartan  (COZAAR ) 50 MG tablet TAKE 1 TABLET BY MOUTH ONCE DAILY 90 tablet 2   Multiple Vitamin (MULTIVITAMIN WITH MINERALS) TABS tablet Take 1 tablet by mouth daily.     naloxone (NARCAN) nasal spray 4 mg/0.1 mL Place 1 spray into the nose once.     omeprazole  (PRILOSEC) 40  MG capsule Take 1 capsule (40 mg total) by mouth in the morning and at bedtime. 180 capsule 2   ondansetron  (ZOFRAN ) 4 MG tablet Take 4 mg by mouth every 8 (eight) hours as needed.     oxyCODONE  (OXYCONTIN ) 40 mg 12 hr tablet Take 40 mg by mouth every 12 (twelve) hours.     Oxycodone  HCl 10 MG TABS Take 10 mg by mouth daily.     pravastatin  (PRAVACHOL ) 40 MG tablet Take 40 mg by mouth.     rifaximin  (XIFAXAN ) 550 MG TABS tablet TAKE 1 TABLET BY MOUTH TWICE DAILY 180 tablet 2   Roflumilast  250 MCG TABS TAKE 1 TABLET BY MOUTH ONCE DAILY 28 tablet 3   rosuvastatin  (CRESTOR ) 20 MG tablet TAKE 1 TABLET BY MOUTH ONCE EVERY EVENING 90 tablet 2   sitaGLIPtin (JANUVIA) 100 MG tablet TAKE 1 TABLET BY MOUTH ONCE DAILY 90 tablet 3   SUMAtriptan  (IMITREX ) 100 MG tablet TAKE 1 TABLET BY MOUTH AS NEEDED FOR HEADACHE. MAX 2 TABLETS PER  DAY 36 tablet 3   tolterodine (DETROL LA) 2 MG 24 hr capsule TAKE 1 CAPSULE BY MOUTH ONCE DAILY 90 capsule 1   vitamin C (ASCORBIC ACID ) 500 MG tablet Take 500 mg by mouth daily.     albuterol  (VENTOLIN  HFA) 108 (90 Base) MCG/ACT inhaler Inhale 2 puffs into the lungs every 6 (six) hours as needed for wheezing or shortness of breath.     No current facility-administered medications for this visit.    Allergies:   Shellfish-derived products and Shellfish allergy    Social History:   reports that she has quit smoking. Her smoking use included e-cigarettes. She has never used smokeless tobacco. She reports that she does not drink alcohol and does not use drugs.   Family History:  family history includes Hypertension in her mother.    ROS:     Review of Systems  Constitutional: Negative.   HENT: Negative.    Eyes: Negative.   Respiratory: Negative.    Gastrointestinal: Negative.   Genitourinary: Negative.   Musculoskeletal: Negative.   Skin: Negative.   Neurological: Negative.   Endo/Heme/Allergies: Negative.   Psychiatric/Behavioral: Negative.    All other systems reviewed and are negative.     All other systems are reviewed and negative.    PHYSICAL EXAM: VS:  BP 110/66   Pulse 87   Ht 5' 3 (1.6 m)   Wt 219 lb 9.6 oz (99.6 kg)   SpO2 93%   BMI 38.90 kg/m  , BMI Body mass index is 38.9 kg/m. Last weight:  Wt Readings from Last 3 Encounters:  03/14/24 219 lb 9.6 oz (99.6 kg)  01/11/24 225 lb 6.4 oz (102.2 kg)  01/05/24 222 lb 10.6 oz (101 kg)     Physical Exam Constitutional:      Appearance: Normal appearance.  Cardiovascular:     Rate and Rhythm: Normal rate and regular rhythm.     Heart sounds: Normal heart sounds.  Pulmonary:     Effort: Pulmonary effort is normal.     Breath sounds: Normal breath sounds.  Musculoskeletal:     Right lower leg: No edema.     Left lower leg: No edema.  Neurological:     Mental Status: She is alert.       EKG:    Recent Labs: 07/29/2023: TSH 0.058 08/14/2023: Magnesium  1.1 01/05/2024: ALT 22; B Natriuretic Peptide 281.4; BUN 23; Creatinine, Ser 0.97; Hemoglobin 11.7; Platelets 214; Potassium 4.0;  Sodium 138    Lipid Panel    Component Value Date/Time   CHOL 117 01/30/2023 1550   TRIG 93 01/30/2023 1550   HDL 55 01/30/2023 1550   CHOLHDL 2.1 01/30/2023 1550   CHOLHDL 2.5 01/10/2021 0659   VLDL 13 01/10/2021 0659   LDLCALC 44 01/30/2023 1550      Other studies Reviewed: Additional studies/ records that were reviewed today include:  Review of the above records demonstrates:       No data to display            ASSESSMENT AND PLAN:    ICD-10-CM   1. Essential hypertension  I10     2. Paroxysmal atrial fibrillation (HCC)  I48.0    Stop asp as on elliquis    3. Acute respiratory failure with hypoxia and hypercapnia (HCC)  J96.01    J96.02     4. Mixed hyperlipidemia  E78.2     5. CHF (congestive heart failure), NYHA class III, acute on chronic, diastolic (HCC)  I50.33     6. SOB (shortness of breath)  R06.02     7. Nonrheumatic mitral valve regurgitation  I34.0    mild moderate MR, EF 65% with grade 3 daistolic dysfunction       Problem List Items Addressed This Visit       Cardiovascular and Mediastinum   Essential hypertension - Primary   Relevant Medications   pravastatin  (PRAVACHOL ) 40 MG tablet   Paroxysmal atrial fibrillation (HCC)   Relevant Medications   pravastatin  (PRAVACHOL ) 40 MG tablet     Respiratory   Acute respiratory failure with hypoxia and hypercapnia (HCC)     Other   HLD (hyperlipidemia)   Relevant Medications   pravastatin  (PRAVACHOL ) 40 MG tablet   Other Visit Diagnoses       CHF (congestive heart failure), NYHA class III, acute on chronic, diastolic (HCC)       Relevant Medications   pravastatin  (PRAVACHOL ) 40 MG tablet     SOB (shortness of breath)         Nonrheumatic mitral valve regurgitation       mild moderate MR, EF 65%  with grade 3 daistolic dysfunction   Relevant Medications   pravastatin  (PRAVACHOL ) 40 MG tablet          Disposition:   Return in about 2 months (around 05/14/2024).    Total time spent: 35 minutes  Signed,  Denyse Bathe, MD  03/14/2024 10:36 AM    [facility]

## 2024-03-15 ENCOUNTER — Ambulatory Visit: Admitting: Internal Medicine

## 2024-03-24 NOTE — Progress Notes (Signed)
 Patient ID: Stephanie Small, female   DOB: [birth year], 78 y.o.   MRN: [id number]  Reason for Visit: Vaginal Exam (Recurrent yeast infections, currently vaginal area is sore. Painful knot inside vagina x 9 months.)   Subjective:  Date of Service: 03/25/2024  HPI:  Stephanie Small is a 78 y.o. female being seen for ongoing symptoms of vaginal irritation and itching. She reports symptoms began after being hospitalized at the beginning of 2025. Says she had constant wetness with adult diapers at that time. She has alternating itching and irritation and thinks she is having recurrent yeast infections. Has tried 3 day Monistat with minimal relief. In a review of her chart it does not appear that she has had testing for vaginal yeast in that time period. She is on supplemental oxygen and has some mobility limitations. Mobility assistance provided during exam.  On exam she has post menopausal vaginal atrophy and evidence of vulvar irritation. No discharge suspicious for yeast is seen. The patient does have a diagnosis of DM and may possibly have yeast. Vaginitis aptima pending.  Past Medical History:  Diagnosis Date   Arthritis    Atypical squamous cells of undetermined significance (ASCUS) on Papanicolaou smear of cervix 10/08/2016   Chest pain 09/20/2020   COPD (chronic obstructive pulmonary disease) (HCC)    Diabetes mellitus without complication (HCC)    Diarrhea 01/09/2021   Elevated troponin    Glaucoma 2010   History of adenomatous polyp of colon    Hypertension 2005   Hypomagnesemia 01/09/2021   Knee pain 05/06/2022   Low back pain of over 3 months duration 05/06/2022   Personal history of colonic polyps    Respiratory distress 10/25/2015   Sepsis (HCC) 01/09/2021   Thyroid  cyst    UTI (urinary tract infection) 01/09/2021   Wheezing 10/25/2015   Family History  Problem Relation Age of Onset   Hypertension Mother    Past Surgical History:  Procedure Laterality Date    CAROTID STENT INSERTION  2011   CHOLECYSTECTOMY  2009   COLONOSCOPY  2007   COLONOSCOPY WITH PROPOFOL  N/A 08/18/2016   Procedure: COLONOSCOPY WITH PROPOFOL ;  Surgeon: Rogelia Copping, MD;  Location: [facility];  Service: Endoscopy;  Laterality: N/A;   FOOT SURGERY     SHOULDER SURGERY Bilateral 2004   UPPER GI ENDOSCOPY      Short Social History:  Social History   Tobacco Use   Smoking status: Former    Types: E-cigarettes   Smokeless tobacco: Never  Substance Use Topics   Alcohol use: No    Allergies  Allergen Reactions   Shellfish-Derived Products Anaphylaxis and Rash    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   Shellfish Allergy Rash    Current Outpatient Medications  Medication Sig Dispense Refill   albuterol  (VENTOLIN  HFA) 108 (90 Base) MCG/ACT inhaler Inhale 2 puffs into the lungs every 6 (six) hours as needed for wheezing or shortness of breath.     amiodarone  (PACERONE ) 200 MG tablet Take 1 tablet (200 mg total) by mouth daily. 30 tablet 11   apixaban  (ELIQUIS )  5 MG TABS tablet TAKE 1 TABLET BY MOUTH TWICE DAILY 180 tablet 3   citalopram  (CELEXA ) 20 MG tablet TAKE 1 TABLET BY MOUTH ONCE DAILY 90 tablet 1   cyclobenzaprine  (FLEXERIL ) 5 MG tablet Take 5 mg by mouth 2 (two) times daily as needed for muscle spasms.     diclofenac  Sodium (VOLTAREN ) 1 % GEL 1 (one) time each day if needed     FARXIGA  10 MG TABS tablet TAKE 1 TABLET BY MOUTH ONCE DAILY 30 tablet 3   fluconazole  (DIFLUCAN ) 100 MG tablet Take 100 mg by mouth daily.     fluticasone  furoate-vilanterol (BREO ELLIPTA ) 100-25 MCG/ACT AEPB Inhale 1  puff into the lungs daily.     furosemide  (LASIX ) 20 MG tablet TAKE 1 TABLET BY MOUTH ONCE DAILY 90 tablet 3   gabapentin  (NEURONTIN ) 400 MG capsule Take 400 mg by mouth 2 (two) times daily.     glucose blood (ONETOUCH VERIO) test strip Use as instructed 100 each 1   isosorbide  mononitrate (IMDUR ) 30 MG 24 hr tablet TAKE 1 TABLET BY MOUTH ONCE DAILY 90 tablet 2   levothyroxine  (SYNTHROID ) 50 MCG tablet TAKE 1 TABLET BY MOUTH ONCE DAILY ON AN EMPTY STOMACH. WAIT 30 MINUTES BEFORE TAKING OTHER MEDS. 30 tablet 2   losartan  (COZAAR ) 50 MG tablet TAKE 1 TABLET BY MOUTH ONCE DAILY 90 tablet 2   Multiple Vitamin (MULTIVITAMIN WITH MINERALS) TABS tablet Take 1 tablet by mouth daily.     naloxone (NARCAN) nasal spray 4 mg/0.1 mL Place 1 spray into the nose once.     omeprazole  (PRILOSEC) 40 MG capsule Take 1 capsule (40 mg total) by mouth in the morning and at bedtime. 180 capsule 2   ondansetron  (ZOFRAN ) 4 MG tablet Take 4 mg by mouth every 8 (eight) hours as needed.     oxyCODONE  (OXYCONTIN ) 40 mg 12 hr tablet Take 40 mg by mouth every 12 (twelve) hours.     Oxycodone  HCl 10 MG TABS Take 10 mg by mouth daily.     pravastatin  (PRAVACHOL ) 40 MG tablet Take 40 mg by mouth.     rifaximin  (XIFAXAN ) 550 MG TABS tablet TAKE 1 TABLET BY MOUTH TWICE DAILY 180 tablet 2   Roflumilast  250 MCG TABS TAKE 1 TABLET BY MOUTH ONCE DAILY 28 tablet 3   rosuvastatin  (CRESTOR ) 20 MG tablet TAKE 1 TABLET BY MOUTH ONCE EVERY EVENING 90 tablet 2   sitaGLIPtin (JANUVIA) 100 MG tablet TAKE 1 TABLET BY MOUTH ONCE DAILY 90 tablet 3   SUMAtriptan  (IMITREX ) 100 MG tablet TAKE 1 TABLET BY MOUTH AS NEEDED FOR HEADACHE. MAX 2 TABLETS PER DAY 36 tablet 3   tolterodine (DETROL LA) 2 MG 24 hr capsule TAKE 1 CAPSULE BY MOUTH ONCE DAILY 90 capsule 1   vitamin C (ASCORBIC ACID ) 500 MG tablet Take 500 mg by mouth daily.     No current facility-administered medications for this visit.    Review of Systems  Constitutional:  Negative for  chills and fever.  HENT:  Negative for congestion, ear discharge, ear pain, hearing loss, sinus pain and sore throat.   Eyes:  Negative for blurred vision and double vision.  Respiratory:  Positive for shortness of breath. Negative for cough and wheezing.   Cardiovascular:  Negative for chest pain, palpitations and leg swelling.  Gastrointestinal:  Negative for abdominal pain, blood in stool, constipation, diarrhea, heartburn, melena, nausea and vomiting.  Genitourinary:  Negative for dysuria, flank pain, frequency, hematuria and urgency.  Positive for vaginal irritation and itching  Musculoskeletal:  Negative for back pain, joint pain and myalgias.  Skin:  Negative for itching and rash.  Neurological:  Negative for dizziness, tingling, tremors, sensory change, speech change, focal weakness, seizures, loss of consciousness, weakness and headaches.  Endo/Heme/Allergies:  Negative for environmental allergies. Does not bruise/bleed easily.  Psychiatric/Behavioral:  Negative for depression, hallucinations, memory loss, substance abuse and suicidal ideas. The patient is not nervous/anxious and does not have insomnia.         Objective:  Objective   Vitals:   03/25/24 1128  BP: 115/81  Pulse: (!) 182  Weight: 212 lb (96.2 kg)  Height: 5' 4 (1.626 m)   Body mass index is 36.39 kg/m. Constitutional: Well nourished, well developed female in no acute distress.  HEENT: normal Skin: Warm and dry.  Cardiovascular: tachycardic Respiratory:  Normal respiratory effort on O2 nasal cannula  Psych: Alert and Oriented x3. No memory deficits. Normal mood and affect.    Pelvic exam: (female chaperone present) is not limited by body habitus EGBUS: vulvar redness Vagina: atrophy of vaginal mucosa Aptima swab collected   Assessment/Plan:     78 y.o. G4 P4004 with vaginal irritation/atrophy, possible yeast infection  Aptima: vaginitis Rx Diflucan  if needed Follow up as needed after lab  results Estrace  vaginal cream Rx   Slater Rains, CNM [facility] 03/28/2024 2:22 PM

## 2024-03-25 ENCOUNTER — Encounter: Payer: Self-pay | Admitting: Advanced Practice Midwife

## 2024-03-25 ENCOUNTER — Other Ambulatory Visit (HOSPITAL_COMMUNITY)
Admission: RE | Admit: 2024-03-25 | Discharge: 2024-03-25 | Disposition: A | Discharge status: Home / Self Care | Source: Ambulatory Visit | Attending: Advanced Practice Midwife | Admitting: Advanced Practice Midwife

## 2024-03-25 ENCOUNTER — Ambulatory Visit: Admitting: Advanced Practice Midwife

## 2024-03-25 ENCOUNTER — Telehealth: Payer: Self-pay

## 2024-03-25 VITALS — BP 115/81 | HR 182 | Ht 64.0 in | Wt 212.0 lb

## 2024-03-25 DIAGNOSIS — N898 Other specified noninflammatory disorders of vagina: Secondary | ICD-10-CM | POA: Diagnosis present

## 2024-03-25 DIAGNOSIS — N952 Postmenopausal atrophic vaginitis: Secondary | ICD-10-CM | POA: Diagnosis not present

## 2024-03-25 MED ORDER — FLUCONAZOLE 150 MG PO TABS: 150.0000 mg | ORAL_TABLET | Freq: Once | ORAL | 1 refills | Status: DC

## 2024-03-25 MED ORDER — ESTROGENS CONJUGATED 0.625 MG/GM VA CREA: TOPICAL_CREAM | VAGINAL | 2 refills | Status: DC

## 2024-03-25 MED ORDER — ESTRADIOL 0.1 MG/GM VA CREA: 1.0000 | TOPICAL_CREAM | VAGINAL | 3 refills | Status: AC

## 2024-03-25 NOTE — Telephone Encounter (Signed)
 Pharmacy calling regarding conjugated estrogens  (PREMARIN ) vaginal cream RX. They are asking for the rx to be changed to Estradiol  0.01% due to cost. Please see below

## 2024-03-28 ENCOUNTER — Encounter: Payer: Self-pay | Admitting: Advanced Practice Midwife

## 2024-03-28 ENCOUNTER — Other Ambulatory Visit: Payer: Self-pay | Admitting: Family

## 2024-03-28 NOTE — Patient Instructions (Signed)
 Vaginal Dryness and Thinning (Atrophic Vaginitis): What to Know  Atrophic vaginitis is a condition where the lining of the vagina becomes thin, dry, and inflamed. This condition can make you more likely to: Get an infection. Have pain during sex. Have other uncomfortable symptoms. Tell your health care provider if you notice any changes in your vagina. They can help you find ways to feel better. They can also treat infections and suggest healthy habits for a healthy vagina. What are the causes? Atrophic vaginitis is caused by a drop in estrogen. It's more common when menstrual periods stop during menopause. This often starts between ages 16-55 and may get worse over time as estrogen levels get lower. Estrogen helps to: Keep the vagina moist. Lubricate the vagina during sex. Protect against infection. A drop in estrogen can lead to: Thinner and drier vaginal lining. A smaller, less stretchy vagina. What increases the risk? Many factors make you more likely to have vaginal dryness and thinning. These include: Taking medicines that block estrogen. Having your ovaries taken out. Cancer treatment, such as: Radiation. Chemotherapy. Having recently delivered a baby, especially if not a vaginal delivery. Breastfeeding. Being over age 45. Having an eating disorder. Smoking. What are the signs or symptoms? Pain, soreness, or bleeding during sex. Burning, irritation, or itching around your vagina. Loss of interest in sex. Burning pain while peeing or peeing often. Vaginal discharge. It may be: White. [place]. Yellow. Blood-tinged. Thick. Watery. Sometimes, there are no symptoms. How is this diagnosed? This condition is diagnosed based on: Your medical history. A pelvic exam to check the tissue in the vagina. Rarely, you may have more tests. These include: A pee test to check for a urinary tract infection. An acid balance check in the vagina. How is this treated? Treatment depends  on your symptoms. Treatment may include: Lubricants for the vagina. Long-acting moisturizers. Low-dose estrogen. These come in: Creams. Tablets. Vaginal rings. Treatment may not be needed if your symptoms are mild and you're not having sex. Talk to your provider about any history of breast cancer, endometrial cancer, or blood clots. Follow these instructions at home: Medicines Take your medicines only as told. Do not use herbal or other medicines unless your provider says it's safe. Use creams, lubricants, or moisturizers only as told. General instructions If your dryness and thinning is related to menopause, talk about your symptoms and treatment options with your provider. Do not douche. Do not use products that make your vagina dry. These include: Scented sprays. Tampons. Soaps. Use water -soluble lubricant or moisturizer if sex is painful. Having sex more often can improve blood flow and make the vaginal tissue more stretchy. Contact a health care provider if: Your discharge from your vagina changes in color and has a smell. You have new symptoms. Your symptoms don't get better with treatment. Your symptoms get worse. This information is not intended to replace advice given to you by your health care provider. Make sure you discuss any questions you have with your health care provider. Document Revised: 01/26/2023 Document Reviewed: 01/26/2023 Elsevier Patient Education  2024 [employer]Irritation of the Vagina (Vaginitis): What to Know  Vaginitis is irritation and swelling of the vagina. It happens when the usual balance of bacteria and yeast in the vagina changes. This change causes some types to grow too much. This overgrowth leads to vaginitis. What are the causes? Bacteria. Yeast, which is a fungus. A parasite. A virus. Low hormones in the body. This can occur during pregnancy, breastfeeding, or  after menopause. What increases the risk? Irritants, such as douches,  bubble baths, scented tampons, and feminine sprays. Antibiotics. Poor hygiene. Wearing tight pants or thong underwear. Some birth control methods, such as diaphragms, vaginal sponges, or spermicides. Having sex without a condom or having sex with more than one person. Infections. Uncontrolled diabetes. What are the signs or symptoms? Abnormal fluid from the vagina. The fluid may be: White, gray, or yellow. Thick, white, and cheesy. Frothy and yellow or green. A bad smell from the vagina. Itching, pain, or swelling in the vagina. Pain during sex. Pain or burning when you pee. How is this diagnosed? This condition is diagnosed based on your symptoms, medical history, and an exam. This may include a pelvic exam. Tests may also be done. Tests may be done to: Check the pH level of your vagina. Check the fluid in your vagina. How is this treated? Treatment will depend on what is causing your vaginitis. Treatment may include: Antibiotics. Antifungal medicines. Medicines to treat symptoms if you have a virus. Your sex partner should also be treated. Estrogen medicines. Medicines to treat allergies. The medicines may be pills or creams. Follow these instructions at home: Lifestyle Keep the area around your vagina clean and dry. Avoid using soap. Rinse the area with water . Until your health care provider says it's okay: Do not douche. Do not use tampons. Use pads, if needed. Do not have sex. Wipe from front to back after going to the bathroom. When the provider says it's okay, practice safe sex. Use condoms. General instructions Take your medicines only as told. If you were given antibiotics, take them as told. Do not stop taking them even if you start to feel better. How is this prevented? Use mild, unscented products. Avoid the following products if they are scented: Sprays. Detergents. Tampons. Products for cleaning the vagina. Soaps or bubble baths. Let air reach your  genital area. To do this: Wear cotton underwear. Do not wear underwear while you sleep. Do not wear tight pants and underwear or pantyhose without a cotton panel. Do not wear thong underwear. Take off any wet clothing, such as bathing suits, as soon as possible. Practice safe sex. Use condoms. Contact a health care provider if: You have pain in the belly or around the pelvis. You have a fever or chills. You have symptoms that last for more than 2-3 days. This information is not intended to replace advice given to you by your health care provider. Make sure you discuss any questions you have with your health care provider. Document Revised: 03/19/2023 Document Reviewed: 10/27/2022 Elsevier Patient Education  2024 [employer].

## 2024-03-29 ENCOUNTER — Ambulatory Visit: Admitting: Internal Medicine

## 2024-03-29 LAB — CERVICOVAGINAL ANCILLARY ONLY
Bacterial Vaginitis (gardnerella): NEGATIVE
Candida Glabrata: POSITIVE — AB
Candida Vaginitis: POSITIVE — AB
Comment: NEGATIVE
Comment: NEGATIVE
Comment: NEGATIVE

## 2024-03-30 ENCOUNTER — Telehealth: Payer: Self-pay | Admitting: Family

## 2024-03-30 NOTE — Telephone Encounter (Signed)
 Pharmacy called stating that they are unable to finsih the bubble pack for pt until the pt CITALOPRAM  20MG  is refilled Please advise

## 2024-03-31 NOTE — Telephone Encounter (Signed)
 Rx already sent 10/1

## 2024-04-08 ENCOUNTER — Ambulatory Visit: Payer: Self-pay | Admitting: Advanced Practice Midwife

## 2024-04-08 ENCOUNTER — Other Ambulatory Visit: Payer: Self-pay | Admitting: Advanced Practice Midwife

## 2024-04-26 ENCOUNTER — Other Ambulatory Visit: Payer: Self-pay | Admitting: Family

## 2024-04-27 ENCOUNTER — Other Ambulatory Visit: Payer: Self-pay

## 2024-05-25 ENCOUNTER — Other Ambulatory Visit: Payer: Self-pay | Admitting: Internal Medicine

## 2024-05-25 ENCOUNTER — Other Ambulatory Visit: Payer: Self-pay | Admitting: Family

## 2024-05-25 DIAGNOSIS — J4489 Other specified chronic obstructive pulmonary disease: Secondary | ICD-10-CM

## 2024-05-25 DIAGNOSIS — N393 Stress incontinence (female) (male): Secondary | ICD-10-CM

## 2024-05-30 ENCOUNTER — Other Ambulatory Visit: Payer: Self-pay | Admitting: Advanced Practice Midwife

## 2024-05-30 DIAGNOSIS — N898 Other specified noninflammatory disorders of vagina: Secondary | ICD-10-CM

## 2024-05-30 NOTE — Telephone Encounter (Signed)
 Spoke with daughter Avelina. She requested a refill of diflucan  from the pharmacy. She wants to verify if Diflucan  is the correct and only medication she needs as she was advised that she had aggressive yeast infection in September. She explained that her mother has multiple health and mental issues and it's difficult to get her into the office.

## 2024-05-30 NOTE — Telephone Encounter (Signed)
 TRIAGE VOICEMAIL: Patient's daughter calling to report the patient has the exact same issue. Requesting return call from provider.

## 2024-06-03 ENCOUNTER — Telehealth: Payer: Self-pay | Admitting: Family

## 2024-06-03 NOTE — Telephone Encounter (Signed)
 Patient's daughter has concerns of her recurring vaginal infections. Has reached out to OBGYN but unable to reach them as the office is closed due to them moving locations. I advised Stephanie Small that they will reopen at their new location on Monday, 12/8 and she should call them back on Monday. In the meantime, I advised her to try Monistat over the weekend.

## 2024-06-13 ENCOUNTER — Ambulatory Visit: Admitting: Cardiovascular Disease

## 2024-06-13 ENCOUNTER — Encounter: Payer: Self-pay | Admitting: Cardiovascular Disease

## 2024-06-13 VITALS — BP 108/64 | HR 85 | Ht 64.0 in | Wt 212.0 lb

## 2024-06-13 DIAGNOSIS — R0602 Shortness of breath: Secondary | ICD-10-CM | POA: Diagnosis not present

## 2024-06-13 DIAGNOSIS — I340 Nonrheumatic mitral (valve) insufficiency: Secondary | ICD-10-CM

## 2024-06-13 DIAGNOSIS — I10 Essential (primary) hypertension: Secondary | ICD-10-CM | POA: Diagnosis not present

## 2024-06-13 DIAGNOSIS — Z131 Encounter for screening for diabetes mellitus: Secondary | ICD-10-CM

## 2024-06-13 DIAGNOSIS — I480 Paroxysmal atrial fibrillation: Secondary | ICD-10-CM

## 2024-06-13 DIAGNOSIS — E782 Mixed hyperlipidemia: Secondary | ICD-10-CM

## 2024-06-13 DIAGNOSIS — I5033 Acute on chronic diastolic (congestive) heart failure: Secondary | ICD-10-CM

## 2024-06-13 NOTE — Progress Notes (Signed)
 Cardiology Office Note   Date:  06/13/2024   ID:  Stephanie Small, Stephanie Small 1945-10-28, MRN [id number]  PCP:  Orlean Alan HERO, FNP  Cardiologist:  Denyse Bathe, MD      History of Present Illness: Stephanie Small is a 78 y.o. female who presents for  Chief Complaint  Patient presents with   Follow-up    3 month follow up    Feeling ok      Past Medical History:  Diagnosis Date   Arthritis    Atypical squamous cells of undetermined significance (ASCUS) on Papanicolaou smear of cervix 10/08/2016   Chest pain 09/20/2020   COPD (chronic obstructive pulmonary disease) (HCC)    Diabetes mellitus without complication (HCC)    Diarrhea 01/09/2021   Elevated troponin    Glaucoma 2010   History of adenomatous polyp of colon    Hypertension 2005   Hypomagnesemia 01/09/2021   Knee pain 05/06/2022   Low back pain of over 3 months duration 05/06/2022   Personal history of colonic polyps    Respiratory distress 10/25/2015   Sepsis (HCC) 01/09/2021   Thyroid  cyst    UTI (urinary tract infection) 01/09/2021   Wheezing 10/25/2015     Past Surgical History:  Procedure Laterality Date   CAROTID STENT INSERTION  2011   CHOLECYSTECTOMY  2009   COLONOSCOPY  2007   COLONOSCOPY WITH PROPOFOL  N/A 08/18/2016   Procedure: COLONOSCOPY WITH PROPOFOL ;  Surgeon: Rogelia Copping, MD;  Location: [facility];  Service: Endoscopy;  Laterality: N/A;   FOOT SURGERY     SHOULDER SURGERY Bilateral 2004   UPPER GI ENDOSCOPY       Current Outpatient Medications  Medication Sig Dispense Refill   albuterol  (VENTOLIN  HFA) 108 (90 Base) MCG/ACT inhaler Inhale 2 puffs into the lungs every 6 (six) hours as needed for wheezing or shortness of breath.     amiodarone  (PACERONE ) 200 MG tablet Take 1 tablet (200 mg total) by mouth daily. 30 tablet 11   apixaban  (ELIQUIS ) 5 MG TABS tablet TAKE 1 TABLET BY MOUTH TWICE DAILY 180 tablet 3   citalopram  (CELEXA ) 20 MG tablet TAKE 1 TABLET BY MOUTH  ONCE DAILY 90 tablet 1   cyclobenzaprine  (FLEXERIL ) 5 MG tablet Take 5 mg by mouth 2 (two) times daily as needed for muscle spasms.     diclofenac  Sodium (VOLTAREN ) 1 % GEL 1 (one) time each day if needed     FARXIGA  10 MG TABS tablet TAKE 1 TABLET BY MOUTH ONCE DAILY 30 tablet 3   fluconazole  (DIFLUCAN ) 150 MG tablet TAKE 1 TABLET BY MOUTH ONCE FOR 1 DOSE. MAY REPEAT 3 DAYS LATER AS NEEDED 1 tablet 1   fluticasone  furoate-vilanterol (BREO ELLIPTA ) 100-25 MCG/ACT AEPB Inhale 1 puff into the lungs daily.     furosemide  (LASIX ) 20 MG tablet TAKE 1 TABLET BY MOUTH ONCE DAILY 90 tablet 3   gabapentin  (NEURONTIN ) 400 MG capsule Take 400 mg by mouth 2 (two) times daily.     glucose blood (ONETOUCH VERIO) test strip Use as instructed 100 each 1   isosorbide  mononitrate (IMDUR ) 30 MG 24 hr tablet TAKE 1 TABLET BY MOUTH ONCE DAILY 90 tablet 2   losartan  (COZAAR ) 50 MG tablet TAKE 1 TABLET BY MOUTH ONCE DAILY 90 tablet 2   Multiple Vitamin (MULTIVITAMIN WITH MINERALS) TABS tablet Take 1 tablet by mouth daily.     naloxone (NARCAN) nasal spray 4 mg/0.1 mL Place 1 spray into the nose  once.     omeprazole  (PRILOSEC) 40 MG capsule Take 1 capsule (40 mg total) by mouth in the morning and at bedtime. 180 capsule 2   ondansetron  (ZOFRAN ) 4 MG tablet Take 4 mg by mouth every 8 (eight) hours as needed.     oxyCODONE  (OXYCONTIN ) 40 mg 12 hr tablet Take 40 mg by mouth every 12 (twelve) hours.     Oxycodone  HCl 10 MG TABS Take 10 mg by mouth daily.     rifaximin  (XIFAXAN ) 550 MG TABS tablet TAKE 1 TABLET BY MOUTH TWICE DAILY 180 tablet 2   Roflumilast  250 MCG TABS TAKE 1 TABLET BY MOUTH ONCE DAILY 28 tablet 0   rosuvastatin  (CRESTOR ) 20 MG tablet TAKE 1 TABLET BY MOUTH ONCE EVERY EVENING 90 tablet 2   sitaGLIPtin (JANUVIA) 100 MG tablet TAKE 1 TABLET BY MOUTH ONCE DAILY 90 tablet 3   SUMAtriptan  (IMITREX ) 100 MG tablet TAKE 1 TABLET BY MOUTH AS NEEDED FOR HEADACHE. MAX 2 TABLETS PER DAY 36 tablet 3   tolterodine  (DETROL LA) 2 MG 24 hr capsule TAKE 1 CAPSULE BY MOUTH ONCE DAILY 90 capsule 1   estradiol  (ESTRACE  VAGINAL) 0.1 MG/GM vaginal cream Place 1 Applicatorful vaginally 3 (three) times a week. (Patient not taking: Reported on 06/13/2024) 42.5 g 3   levothyroxine  (SYNTHROID ) 50 MCG tablet TAKE 1 TABLET BY MOUTH ONCE DAILY ON AN EMPTY STOMACH. WAIT 30 MINUTES BEFORE TAKING OTHER MEDS. (Patient not taking: Reported on 06/13/2024) 30 tablet 2   pravastatin  (PRAVACHOL ) 40 MG tablet Take 40 mg by mouth. (Patient not taking: Reported on 06/13/2024)     vitamin C (ASCORBIC ACID ) 500 MG tablet Take 500 mg by mouth daily. (Patient not taking: Reported on 06/13/2024)     No current facility-administered medications for this visit.    Allergies:   Shellfish protein-containing drug products, Oyster shell, and Shellfish allergy    Social History:   reports that she has quit smoking. Her smoking use included e-cigarettes. She has never used smokeless tobacco. She reports that she does not drink alcohol and does not use drugs.   Family History:  family history includes Hypertension in her mother.    ROS:     Review of Systems  Constitutional: Negative.   HENT: Negative.    Eyes: Negative.   Respiratory: Negative.    Gastrointestinal: Negative.   Genitourinary: Negative.   Musculoskeletal: Negative.   Skin: Negative.   Neurological: Negative.   Endo/Heme/Allergies: Negative.   Psychiatric/Behavioral: Negative.    All other systems reviewed and are negative.     All other systems are reviewed and negative.    PHYSICAL EXAM: VS:  BP 108/64   Pulse 85   Ht 5' 4 (1.626 m)   Wt 212 lb (96.2 kg)   SpO2 95%   BMI 36.39 kg/m  , BMI Body mass index is 36.39 kg/m. Last weight:  Wt Readings from Last 3 Encounters:  06/13/24 212 lb (96.2 kg)  03/25/24 212 lb (96.2 kg)  03/14/24 219 lb 9.6 oz (99.6 kg)     Physical Exam Constitutional:      Appearance: Normal appearance.  Cardiovascular:      Rate and Rhythm: Normal rate and regular rhythm.     Heart sounds: Normal heart sounds.  Pulmonary:     Effort: Pulmonary effort is normal.     Breath sounds: Normal breath sounds.  Musculoskeletal:     Right lower leg: No edema.     Left lower  leg: No edema.  Neurological:     Mental Status: She is alert.       EKG:   Recent Labs: 07/29/2023: TSH 0.058 08/14/2023: Magnesium  1.1 01/05/2024: ALT 22; B Natriuretic Peptide 281.4; BUN 23; Creatinine, Ser 0.97; Hemoglobin 11.7; Platelets 214; Potassium 4.0; Sodium 138    Lipid Panel    Component Value Date/Time   CHOL 117 01/30/2023 1550   TRIG 93 01/30/2023 1550   HDL 55 01/30/2023 1550   CHOLHDL 2.1 01/30/2023 1550   CHOLHDL 2.5 01/10/2021 0659   VLDL 13 01/10/2021 0659   LDLCALC 44 01/30/2023 1550      Other studies Reviewed: Additional studies/ records that were reviewed today include:  Review of the above records demonstrates:       No data to display            ASSESSMENT AND PLAN:    ICD-10-CM   1. Essential hypertension  I10 PCV ECHOCARDIOGRAM COMPLETE    2. Paroxysmal atrial fibrillation (HCC)  I48.0 PCV ECHOCARDIOGRAM COMPLETE    3. Mixed hyperlipidemia  E78.2 PCV ECHOCARDIOGRAM COMPLETE    4. CHF (congestive heart failure), NYHA class III, acute on chronic, diastolic (HCC)  I50.33 PCV ECHOCARDIOGRAM COMPLETE    5. SOB (shortness of breath)  R06.02 PCV ECHOCARDIOGRAM COMPLETE    6. Nonrheumatic mitral valve regurgitation  I34.0 PCV ECHOCARDIOGRAM COMPLETE       Problem List Items Addressed This Visit       Cardiovascular and Mediastinum   Essential hypertension - Primary   Relevant Orders   PCV ECHOCARDIOGRAM COMPLETE   Paroxysmal atrial fibrillation (HCC)   Relevant Orders   PCV ECHOCARDIOGRAM COMPLETE     Other   HLD (hyperlipidemia)   Relevant Orders   PCV ECHOCARDIOGRAM COMPLETE   Other Visit Diagnoses       CHF (congestive heart failure), NYHA class III, acute on chronic,  diastolic (HCC)       Relevant Orders   PCV ECHOCARDIOGRAM COMPLETE     SOB (shortness of breath)       Relevant Orders   PCV ECHOCARDIOGRAM COMPLETE     Nonrheumatic mitral valve regurgitation       Relevant Orders   PCV ECHOCARDIOGRAM COMPLETE          Disposition:   Return in about 3 months (around 09/11/2024) for echo and f/u.    Total time spent: 35 minutes  Signed,  Denyse Bathe, MD  06/13/2024 10:40 AM    [facility]

## 2024-06-24 ENCOUNTER — Other Ambulatory Visit: Payer: Self-pay | Admitting: Family

## 2024-06-24 ENCOUNTER — Other Ambulatory Visit: Payer: Self-pay | Admitting: Internal Medicine

## 2024-06-24 ENCOUNTER — Telehealth: Payer: Self-pay

## 2024-06-24 DIAGNOSIS — J4489 Other specified chronic obstructive pulmonary disease: Secondary | ICD-10-CM

## 2024-06-24 NOTE — Telephone Encounter (Signed)
 Pt daughter advised that need PFT appt and follow with dsk for further refills

## 2024-07-22 ENCOUNTER — Other Ambulatory Visit: Payer: Self-pay | Admitting: Internal Medicine

## 2024-07-22 DIAGNOSIS — N1832 Chronic kidney disease, stage 3b: Secondary | ICD-10-CM

## 2024-07-22 DIAGNOSIS — J4489 Other specified chronic obstructive pulmonary disease: Secondary | ICD-10-CM

## 2024-07-22 DIAGNOSIS — I480 Paroxysmal atrial fibrillation: Secondary | ICD-10-CM

## 2024-07-28 ENCOUNTER — Other Ambulatory Visit: Payer: Self-pay | Admitting: Family

## 2024-07-28 ENCOUNTER — Encounter: Payer: Self-pay | Admitting: Family

## 2024-07-28 ENCOUNTER — Ambulatory Visit: Admitting: Family

## 2024-07-28 VITALS — BP 120/68 | HR 66 | Ht 64.0 in | Wt 212.0 lb

## 2024-07-28 DIAGNOSIS — E1122 Type 2 diabetes mellitus with diabetic chronic kidney disease: Secondary | ICD-10-CM

## 2024-07-28 LAB — POC CREATINE & ALBUMIN,URINE
Creatinine, POC: 50 mg/dL
Microalbumin Ur, POC: 30 mg/L

## 2024-07-28 NOTE — Progress Notes (Unsigned)
 "  Established Patient Office Visit  Subjective:  Patient ID: Stephanie Small, female    DOB: Aug 30, 1945  Age: 79 y.o. MRN: [id number]  No chief complaint on file.   Says that she has pain in her hands, numbness and tingling.   The insurance     No other concerns at this time.   Past Medical History:  Diagnosis Date   Arthritis    Atypical squamous cells of undetermined significance (ASCUS) on Papanicolaou smear of cervix 10/08/2016   Chest pain 09/20/2020   COPD (chronic obstructive pulmonary disease) (HCC)    Diabetes mellitus without complication (HCC)    Diarrhea 01/09/2021   Elevated troponin    Glaucoma 2010   History of adenomatous polyp of colon    Hypertension 2005   Hypomagnesemia 01/09/2021   Knee pain 05/06/2022   Low back pain of over 3 months duration 05/06/2022   Personal history of colonic polyps    Respiratory distress 10/25/2015   Sepsis (HCC) 01/09/2021   Thyroid  cyst    UTI (urinary tract infection) 01/09/2021   Wheezing 10/25/2015    Past Surgical History:  Procedure Laterality Date   CAROTID STENT INSERTION  2011   CHOLECYSTECTOMY  2009   COLONOSCOPY  2007   COLONOSCOPY WITH PROPOFOL  N/A 08/18/2016   Procedure: COLONOSCOPY WITH PROPOFOL ;  Surgeon: Rogelia Copping, MD;  Location: [facility];  Service: Endoscopy;  Laterality: N/A;   FOOT SURGERY     SHOULDER SURGERY Bilateral 2004   UPPER GI ENDOSCOPY      Social History   Socioeconomic History   Marital status: Widowed    Spouse name: Not on file   Number of children: Not on file   Years of education: Not on file   Highest education level: Not on file  Occupational History   Not on file  Tobacco Use   Smoking status: Former    Types: E-cigarettes   Smokeless tobacco: Never  Vaping Use   Vaping status: Never Used  Substance and Sexual Activity   Alcohol use: No   Drug use: No   Sexual activity: Not Currently    Birth  control/protection: Post-menopausal  Other Topics Concern   Not on file  Social History Narrative   Not on file   Social Drivers of Health   Tobacco Use: Medium Risk (07/28/2024)   Patient History    Smoking Tobacco Use: Former    Smokeless Tobacco Use: Never    Passive Exposure: Not on Actuary Strain: Not on file  Food Insecurity: No Food Insecurity (11/15/2023)   Hunger Vital Sign    Worried About Running Out of Food in the Last Year: Never true    Ran Out of Food in the Last Year: Never true  Transportation Needs: No Transportation Needs (11/15/2023)   PRAPARE - Administrator, Civil Service (Medical): No    Lack of Transportation (Non-Medical): No  Physical Activity: Not on file  Stress: Not on file  Social Connections: Moderately Isolated (11/15/2023)   Social Connection and Isolation Panel    Frequency of Communication with Friends and Family: More than three times a week    Frequency of Social Gatherings with Friends and Family: Once a week    Attends Religious Services: More than 4 times per year    Active Member of [employer] or Organizations: No    Attends Banker Meetings: Never    Marital Status: Widowed  Intimate Partner Violence: Patient  Unable To Answer (03/10/2024)   Received from [facility]   Epic    Within the last year, have you been afraid of your partner or ex-partner?: Patient unable to answer    Within the last year, have you been humiliated or emotionally abused in other ways by your partner or ex-partner?: Patient unable to answer    Within the last year, have you been kicked, hit, slapped, or otherwise physically hurt by your partner or ex-partner?: Patient unable to answer    Within the last year, have you been raped or forced to have any kind of sexual activity by your partner or ex-partner?: Patient unable to answer  Depression (PHQ2-9): Not on file  Alcohol Screen: Not on file  Housing:  Unknown (02/05/2024)   Received from [facility]   Epic    Unable to Pay for Housing in the Last Year: Not on file    Number of Times Moved in the Last Year: Not on file    At any time in the past 12 months, were you homeless or living in a shelter (including now)?: No  Utilities: Not At Risk (11/15/2023)   [employer] Utilities    Threatened with loss of utilities: No  Health Literacy: Not on file    Family History  Problem Relation Age of Onset   Hypertension Mother     Allergies[1]  Review of Systems  All other systems reviewed and are negative.      Objective:   BP 120/68   Pulse 66   Ht 5' 4 (1.626 m)   Wt 212 lb (96.2 kg)   SpO2 96%   BMI 36.39 kg/m   Vitals:   07/28/24 1017  BP: 120/68  Pulse: 66  Height: 5' 4 (1.626 m)  Weight: 212 lb (96.2 kg)  SpO2: 96%  BMI (Calculated): 36.37    Physical Exam Vitals and nursing note reviewed.  Constitutional:      Appearance: Normal appearance. She is normal weight.  HENT:     Head: Normocephalic.  Eyes:     Extraocular Movements: Extraocular movements intact.     Conjunctiva/sclera: Conjunctivae normal.     Pupils: Pupils are equal, round, and reactive to light.  Cardiovascular:     Rate and Rhythm: Normal rate.  Pulmonary:     Effort: Pulmonary effort is normal.  Neurological:     General: No focal deficit present.     Mental Status: She is alert and oriented to person, place, and time. Mental status is at baseline.  Psychiatric:        Mood and Affect: Mood normal.        Behavior: Behavior normal.        Thought Content: Thought content normal.      No results found for any visits on 07/28/24.  No results found for this or any previous visit (from the past 2160 hours).     Assessment & Plan:   Assessment & Plan     No follow-ups on file.   Total time spent: {AMA time spent:29001} minutes  ALAN CHRISTELLA ARRANT, FNP  07/28/2024   This document may have been prepared by  [facility] Voice Recognition software and as such may include unintentional dictation errors.       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   Shellfish Allergy Rash  "

## 2024-08-29 ENCOUNTER — Ambulatory Visit: Admitting: Family

## 2024-10-14 ENCOUNTER — Other Ambulatory Visit

## 2024-10-18 ENCOUNTER — Ambulatory Visit: Admitting: Cardiovascular Disease
